# Patient Record
Sex: Female | Born: 1935
Health system: Southern US, Community
[De-identification: ages and names within clinical notes are randomized; demographics above are authoritative.]

## PROBLEM LIST (undated history)

## (undated) DIAGNOSIS — E782 Mixed hyperlipidemia: Secondary | ICD-10-CM

## (undated) DIAGNOSIS — C519 Malignant neoplasm of vulva, unspecified: Secondary | ICD-10-CM

## (undated) DIAGNOSIS — R112 Nausea with vomiting, unspecified: Secondary | ICD-10-CM

## (undated) DIAGNOSIS — M1990 Unspecified osteoarthritis, unspecified site: Secondary | ICD-10-CM

## (undated) DIAGNOSIS — C4499 Other specified malignant neoplasm of skin, unspecified: Secondary | ICD-10-CM

## (undated) DIAGNOSIS — N368 Other specified disorders of urethra: Secondary | ICD-10-CM

## (undated) DIAGNOSIS — I10 Essential (primary) hypertension: Secondary | ICD-10-CM

## (undated) DIAGNOSIS — Z9889 Other specified postprocedural states: Secondary | ICD-10-CM

## (undated) DIAGNOSIS — N369 Urethral disorder, unspecified: Secondary | ICD-10-CM

## (undated) DIAGNOSIS — G4700 Insomnia, unspecified: Secondary | ICD-10-CM

## (undated) HISTORY — DX: Unspecified osteoarthritis, unspecified site: M19.90

## (undated) HISTORY — DX: Mixed hyperlipidemia: E78.2

## (undated) HISTORY — DX: Other specified malignant neoplasm of skin, unspecified: C44.99

## (undated) HISTORY — PX: OTHER SURGICAL HISTORY: SHX169

## (undated) HISTORY — PX: VULVECTOMY: SHX1086

## (undated) HISTORY — DX: Other specified disorders of urethra: N36.8

## (undated) HISTORY — PX: TOTAL ABDOMINAL HYSTERECTOMY W/ BILATERAL SALPINGOOPHORECTOMY: SHX83

## (undated) HISTORY — DX: Malignant neoplasm of vulva, unspecified: C51.9

## (undated) HISTORY — DX: Essential (primary) hypertension: I10

## (undated) HISTORY — PX: ABDOMINAL HYSTERECTOMY: SHX81

## (undated) HISTORY — DX: Insomnia, unspecified: G47.00

## (undated) HISTORY — PX: CHOLECYSTECTOMY: SHX55

## (undated) HISTORY — PX: GALLBLADDER SURGERY: SHX652

## (undated) HISTORY — PX: TONSILLECTOMY: SUR1361

## (undated) HISTORY — DX: Urethral disorder, unspecified: N36.9

---

## 2012-12-05 ENCOUNTER — Ambulatory Visit: Payer: Self-pay | Admitting: Gynecologic Oncology

## 2012-12-11 LAB — PATHOLOGY REPORT

## 2012-12-24 ENCOUNTER — Ambulatory Visit: Payer: Self-pay | Admitting: Gynecologic Oncology

## 2013-01-29 ENCOUNTER — Ambulatory Visit: Payer: Self-pay | Admitting: Gynecologic Oncology

## 2013-01-30 ENCOUNTER — Ambulatory Visit: Payer: Self-pay | Admitting: Gynecologic Oncology

## 2013-01-30 LAB — CBC
HCT: 40.6 % (ref 35.0–47.0)
MCH: 31.8 pg (ref 26.0–34.0)
Platelet: 207 10*3/uL (ref 150–440)
RDW: 13.3 % (ref 11.5–14.5)

## 2013-01-30 LAB — BASIC METABOLIC PANEL
Calcium, Total: 9.6 mg/dL (ref 8.5–10.1)
Chloride: 105 mmol/L (ref 98–107)
Co2: 33 mmol/L — ABNORMAL HIGH (ref 21–32)
Creatinine: 0.88 mg/dL (ref 0.60–1.30)
EGFR (African American): >60
EGFR (Non-African Amer.): >60
Osmolality: 283 (ref 275–301)

## 2013-02-06 ENCOUNTER — Ambulatory Visit: Payer: Self-pay | Admitting: Gynecologic Oncology

## 2013-02-08 LAB — PATHOLOGY REPORT

## 2013-02-23 ENCOUNTER — Ambulatory Visit: Payer: Self-pay | Admitting: Gynecologic Oncology

## 2013-03-26 ENCOUNTER — Ambulatory Visit: Payer: Self-pay | Admitting: Gynecologic Oncology

## 2013-05-11 ENCOUNTER — Ambulatory Visit: Payer: Self-pay | Admitting: Family Medicine

## 2013-06-26 ENCOUNTER — Ambulatory Visit: Payer: Self-pay | Admitting: Gynecologic Oncology

## 2013-07-26 ENCOUNTER — Ambulatory Visit: Payer: Self-pay | Admitting: Gynecologic Oncology

## 2013-12-24 ENCOUNTER — Ambulatory Visit: Payer: Self-pay | Admitting: Gynecologic Oncology

## 2014-01-23 ENCOUNTER — Ambulatory Visit: Payer: Self-pay | Admitting: Gynecologic Oncology

## 2014-03-19 ENCOUNTER — Ambulatory Visit: Payer: Self-pay | Admitting: Urology

## 2014-03-19 LAB — BASIC METABOLIC PANEL
Anion Gap: 8 (ref 7–16)
BUN: 17 mg/dL (ref 7–18)
CALCIUM: 9.3 mg/dL (ref 8.5–10.1)
CREATININE: 0.85 mg/dL (ref 0.60–1.30)
Chloride: 102 mmol/L (ref 98–107)
Co2: 30 mmol/L (ref 21–32)
EGFR (African American): >60
Glucose: 104 mg/dL — ABNORMAL HIGH (ref 65–99)
Osmolality: 281 (ref 275–301)
Potassium: 3.8 mmol/L (ref 3.5–5.1)
Sodium: 140 mmol/L (ref 136–145)

## 2014-03-19 LAB — CBC
HCT: 41.3 % (ref 35.0–47.0)
HGB: 14.1 g/dL (ref 12.0–16.0)
MCH: 31.4 pg (ref 26.0–34.0)
MCHC: 34.1 g/dL (ref 32.0–36.0)
MCV: 92 fL (ref 80–100)
PLATELETS: 228 10*3/uL (ref 150–440)
RBC: 4.49 10*6/uL (ref 3.80–5.20)
RDW: 13.7 % (ref 11.5–14.5)
WBC: 9.5 10*3/uL (ref 3.6–11.0)

## 2014-03-19 LAB — PROTIME-INR
INR: 1
PROTHROMBIN TIME: 13.4 s (ref 11.5–14.7)

## 2014-03-19 LAB — APTT: Activated PTT: 35.7 secs (ref 23.6–35.9)

## 2014-03-20 LAB — URINALYSIS, COMPLETE
BACTERIA: NONE SEEN
Bilirubin,UR: NEGATIVE
GLUCOSE, UR: NEGATIVE mg/dL (ref 0–75)
Ketone: NEGATIVE
LEUKOCYTE ESTERASE: NEGATIVE
NITRITE: NEGATIVE
PH: 7 (ref 4.5–8.0)
Protein: 100
SPECIFIC GRAVITY: 1.013 (ref 1.003–1.030)
Squamous Epithelial: NONE SEEN
WBC UR: 6 /HPF (ref 0–5)

## 2014-03-21 ENCOUNTER — Encounter: Payer: Self-pay | Admitting: Cardiology

## 2014-03-21 ENCOUNTER — Ambulatory Visit (INDEPENDENT_AMBULATORY_CARE_PROVIDER_SITE_OTHER): Payer: Medicare PPO | Admitting: Cardiology

## 2014-03-21 VITALS — BP 151/68 | HR 60 | Ht 65.0 in | Wt 171.0 lb

## 2014-03-21 DIAGNOSIS — I10 Essential (primary) hypertension: Secondary | ICD-10-CM | POA: Insufficient documentation

## 2014-03-21 DIAGNOSIS — E782 Mixed hyperlipidemia: Secondary | ICD-10-CM | POA: Insufficient documentation

## 2014-03-21 DIAGNOSIS — Z01810 Encounter for preprocedural cardiovascular examination: Secondary | ICD-10-CM

## 2014-03-21 NOTE — Progress Notes (Signed)
Clinical Summary Ms. Matos is a 80 y.o.female referred by Dr. Larene Beach of [facility] for cardiology consultation. She reports recent hematuria and has had urological evaluation with Dr. Hollice Espy. Plan is for her to undergo an elective urethral biopsy/bladder biopsy. She had a recent pre-anesthesia evaluation at [facility] and was told that she had an abnormal ECG prompting this referral.  Symptomatically speaking, she denies any exertional chest pain, reports NYHA class I dyspnea on exertion, no palpitations or syncope. She reports no personal history of CAD, myocardial infarction, CHF, or arrhythmia. She states that she is active in her ADLs, describes activities exceeding 4 METs.  ECG done in the office today shows sinus bradycardia. I reviewed the recent and year old tracing done at [facility]. Both show sinus rhythm with nonspecific T-wave changes. PR interval with mildly prolonged on the tracing from July 2014, less notable no recent tracing. It looks like there were was different lead position and most recent tracing with a more leftward axis, not apparent on today's tracing. The long and short of it is that there is nothing that looks overly concerning that would prevent her from proceeding with planned urologic procedure, particularly in light of her clinical stability.  Allergies  Allergen Reactions  . Percocet [Oxycodone-Acetaminophen] Rash    Current Outpatient Prescriptions  Medication Sig Dispense Refill  . aspirin 81 MG tablet Take 81 mg by mouth daily. Holding due to surgery.      Marland Kitchen atenolol (TENORMIN) 100 MG tablet Take 100 mg by mouth daily.      . hydrochlorothiazide (HYDRODIURIL) 25 MG tablet Take 25 mg by mouth daily.      Marland Kitchen lisinopril (PRINIVIL,ZESTRIL) 40 MG tablet Take 40 mg by mouth daily.      . simvastatin (ZOCOR) 40 MG tablet Take 40 mg by mouth daily.       No current facility-administered medications for this visit.    Past  Medical History  Diagnosis Date  . Essential hypertension, benign   . Mixed hyperlipidemia   . Insomnia   . Osteoarthritis   . Paget's disease of vulva     Past Surgical History  Procedure Laterality Date  . Tonsillectomy    . Vulvectomy    . Abdominal hysterectomy    . External hemorrhoid surgery    . Gallbladder surgery      Family History  Problem Relation Age of Onset  . Heart disease Father     Diagnosed age 6  . Heart failure Mother   . Cancer Brother     Social History Ms. Taney reports that she has never smoked. She does not have any smokeless tobacco history on file. Ms. Schum reports that she does not drink alcohol.  Review of Systems Other systems reviewed and negative.  Physical Examination Filed Vitals:   03/21/14 0825  BP: 151/68  Pulse: 60   Filed Weights   03/21/14 0825  Weight: 171 lb (77.565 kg)   Patient appears comfortable at rest, normally nourished. HEENT: Conjunctiva and lids normal, oropharynx clear. Neck: Supple, no elevated JVP or carotid bruits, no thyromegaly. Lungs: Clear to auscultation, nonlabored breathing at rest. Cardiac: Regular rate and rhythm, no S3 or significant systolic murmur, no pericardial rub. Abdomen: Soft, nontender, bowel sounds present, no guarding or rebound. Extremities: No pitting edema, distal pulses 2+. Skin: Warm and dry. Musculoskeletal: No kyphosis. Neuropsychiatric: Alert and oriented x3, affect grossly appropriate.   Problem List and Plan   Preoperative  cardiovascular examination Patient being considered for elective urethral biopsy/bladder biopsy secondary to recent onset hematuria. She has a history of Paget's disease of the vulva, has had several surgeries in the past, and reports that this has otherwise been stable. I reviewed the recent ECGs, including repeat tracing done today. As noted above, there is no major abnormality that would require further investigation now, particularly in light  of clinical stability with good functional capacity at baseline, and no personal history of CAD or arrhythmia. She is cleared to proceed with planned procedure at an acceptable perioperative cardiac risk, overall low.  Essential hypertension, benign Blood pressure elevated today. She states she has a follow with Dr. Luan Pulling this week, otherwise compliant with medical therapy.  Mixed hyperlipidemia On Zocor.    Satira Sark, M.D., F.A.C.C.

## 2014-03-21 NOTE — Assessment & Plan Note (Signed)
On Zocor 

## 2014-03-21 NOTE — Assessment & Plan Note (Signed)
Blood pressure elevated today. She states she has a follow with Dr. Luan Pulling this week, otherwise compliant with medical therapy.

## 2014-03-21 NOTE — Patient Instructions (Signed)
Your physician recommends that you schedule a follow-up appointment in: as needed Your physician recommends that you continue on your current medications as directed. Please refer to the Current Medication list given to you today.   

## 2014-03-21 NOTE — Assessment & Plan Note (Signed)
Patient being considered for elective urethral biopsy/bladder biopsy secondary to recent onset hematuria. She has a history of Paget's disease of the vulva, has had several surgeries in the past, and reports that this has otherwise been stable. I reviewed the recent ECGs, including repeat tracing done today. As noted above, there is no major abnormality that would require further investigation now, particularly in light of clinical stability with good functional capacity at baseline, and no personal history of CAD or arrhythmia. She is cleared to proceed with planned procedure at an acceptable perioperative cardiac risk, overall low.

## 2014-03-22 LAB — URINE CULTURE

## 2014-03-27 ENCOUNTER — Ambulatory Visit: Payer: Self-pay | Admitting: Urology

## 2014-04-16 LAB — PATHOLOGY REPORT

## 2014-06-26 ENCOUNTER — Ambulatory Visit: Payer: Self-pay | Admitting: Obstetrics and Gynecology

## 2014-07-26 ENCOUNTER — Ambulatory Visit: Payer: Self-pay | Admitting: Obstetrics and Gynecology

## 2014-11-04 DIAGNOSIS — M170 Bilateral primary osteoarthritis of knee: Secondary | ICD-10-CM | POA: Insufficient documentation

## 2014-11-15 NOTE — Op Note (Signed)
PATIENT NAME:  Carla Sparks, Carla Sparks MR#:  [id number] DATE OF BIRTH:  [birth year]  DATE OF PROCEDURE:  02/06/2013  PREOPERATIVE DIAGNOSIS: Recurrent Paget's disease of the vulva.   POSTOPERATIVE DIAGNOSIS: Recurrent Paget's disease of the vulva.   PROCEDURE PERFORMED: Wide local excision of 2 lesions.   SURGEON: Weber Cooks, MD  ANESTHESIA: General.   ESTIMATED BLOOD LOSS: 50 mL.   COMPLICATIONS: None.   INDICATION FOR SURGERY: [name] is a 81 year old patient with a long history of recurrent Paget's disease, which has been treated with multiple resections. When seen in the clinic, a suspicious area was seen on the anterior mons. Biopsy confirmed the presence of Paget's disease. Another suspicious lesion was seen on the right labia minora. Therefore, the decision was made to proceed with resection.   OPERATIVE REPORT: After adequate general anesthesia had been obtained, the patient was prepped and draped in high lithotomy position. The suspicious area on the right labia minora was biopsied to confirm the presence of Paget's disease. Then, first, the anterior lesion was circumferentially incised. The incision was carried down to the subcutaneous tissue, and the specimen was then removed using cautery. Hemostasis was adequate. Tissue flaps were raised on both sides. The skin was then reapproximated with interrupted 2-0 Vicryl stitches and closed with mattress sutures again using 2-0 Vicryl.   After frozen section confirmed the presence of Paget's disease, the lesion on the right labia minora was again circumferentially incised and removed using cautery, including some of the subcutaneous tissue. Hemostasis was achieved with cautery. Tissue flaps were then raised from the vagina and laterally. The subcutaneous tissue was reapproximated using 2-0 Vicryl interrupted sutures, and the skin was closed again using 2-0 Vicryl in a mattress suture.   Hemostasis was adequate at the end of the  procedure. The bladder was drained. Silvadene cream was applied.   Pad, sponge, needle and instrument counts were correct x2.   ____________________________ Weber Cooks, MD bem:OSi D: 02/07/2013 09:04:47 ET T: 02/07/2013 09:29:52 ET JOB#: [id number]  cc: Weber Cooks, MD, <Dictator> Weber Cooks MD ELECTRONICALLY SIGNED 02/14/2013 15:19

## 2014-11-16 NOTE — Op Note (Signed)
PATIENT NAME:  Carla Sparks, Carla Sparks MR#:  [id number] DATE OF BIRTH:  Aug 24, 1933  DATE OF PROCEDURE:  03/27/2014  PREOPERATIVE DIAGNOSES: Hematuria, urethral lesion.  POSTOPERATIVE DIAGNOSES: Hematuria, urethral lesion.  PROCEDURE PERFORMED: Cystoscopy, bilateral retrograde pyelogram, bladder biopsy, urethral biopsy, interpretation of fluoroscopy less than 30 minutes.  ATTENDING SURGEON: Sherlynn Stalls, MD  ANESTHESIA: General anesthesia.  ESTIMATED BLOOD LOSS: 25 mL.  DRAINS: None.  COMPLICATIONS: None.  SPECIMENS: Random bladder biopsies, urethral biopsy.  INDICATIONS: This is a 81 year old woman with a history of Paget disease and multiple labial reconstructive surgeries. She presented to the urology clinic with new-onset gross hematuria, bleeding from a periurethral lesion. She was counseled to undergo biopsy of this mass in the operating room along with cystoscopy and bilateral retrograde to complete her hematuria workup. Risks and benefits of the procedure were explained in detail. The patient agreed to proceed as planned.  PROCEDURE: The patient was correctly identified in the preoperative holding area and informed consent was confirmed. She was brought to the operating room, a universal timeout protocol was performed. All team members were identified and Venodyne boots were placed and she was administered IV Ancef in the perioperative period. Of note, she did have a positive urine culture preoperatively and was treated appropriately with ciprofloxacin, which she has been taking as instructed. She was then placed under general anesthesia, repositioned in the dorsal lithotomy position, and prepped and draped in the standard surgical fashion. At this point in time, the perineum was inspected and no other obvious tumors or lesions were identified at this time. Inspection of her urethra did reveal a fungating, beefy red, tongue-like lesion at the 6 o'clock position of the urethra spanning  from approximately 5 o'clock to 7 o'clock, approximately 2 cm in length, somewhat heaped up. There was some slight oozing noted from this lesion. A rigid cystoscope using a 22-French access sheath was then advanced into the bladder and the bladder was surveilled. This revealed bullous edema throughout the bladder involving the bladder globally without any areas of sparing. 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 Given her somewhat complex history, the decision was made to go ahead and biopsy these areas, at which time 4 random bladder biopsies were performed: Two in the posterior walls and two on each lateral wall of the bladder. These were sent off for pathologic analysis. The bleeding areas were fulgurated until hemostasis was noted. At this point, each UO was cannulated using a 5-French open-ended ureteral catheter just within the UO and then bilateral retrograde pyelograms were performed. These were normal, without any evidence of hydronephrosis or filling defects throughout the entirety of the ureter or within the renal pelvises. At this point in time, the scope was removed and careful attention was paid to the urethra. There was no obvious involvement of the mass within the proximal and mid urethra. It appeared to involve the inferior portion of the urethra very distally and externally. At this point in time, a 15 blade was used to excise a large portion of the urethral lesion, which was sent off as a biopsy. At this point there was a good amount of bleeding from the lesion and hemostasis was controlled with a series of figure-of-eight chromic sutures within the lesion as well as fulguration with a pinpoint Bovie electrocautery. Ultimately, adequate hemostasis was obtained. The lesion was instilled with 10 mL of 0.5% lidocaine for local anesthetic. The sheath with obturator was then passed easily into the bladder to confirm urethral patency after  the sutures had been thrown without difficulty. The  operation was then deemed complete. She was returned to the supine position, reversed from anesthesia, and taken to the PACU in stable condition. There were no complications in this case.    ____________________________ Sherlynn Stalls, MD ajb:ST D: 03/27/2014 19:02:35 ET T: 03/28/2014 00:13:00 ET JOB#: [id number]  cc: Sherlynn Stalls, MD, <Dictator> Sherlynn Stalls MD ELECTRONICALLY SIGNED 05/05/2014 8:16

## 2014-11-27 ENCOUNTER — Ambulatory Visit: Payer: Medicare PPO

## 2014-12-16 ENCOUNTER — Other Ambulatory Visit: Payer: Self-pay | Admitting: Family Medicine

## 2014-12-16 DIAGNOSIS — Z13820 Encounter for screening for osteoporosis: Secondary | ICD-10-CM

## 2014-12-18 ENCOUNTER — Inpatient Hospital Stay: Payer: Medicare PPO | Attending: Obstetrics and Gynecology | Admitting: Obstetrics and Gynecology

## 2014-12-18 ENCOUNTER — Encounter (INDEPENDENT_AMBULATORY_CARE_PROVIDER_SITE_OTHER): Payer: Self-pay

## 2014-12-18 ENCOUNTER — Ambulatory Visit: Payer: Medicare PPO

## 2014-12-18 ENCOUNTER — Encounter: Payer: Self-pay | Admitting: *Deleted

## 2014-12-18 VITALS — BP 146/71 | HR 65 | Temp 99.0°F | Resp 18 | Ht 65.0 in | Wt 166.9 lb

## 2014-12-18 DIAGNOSIS — M1990 Unspecified osteoarthritis, unspecified site: Secondary | ICD-10-CM | POA: Insufficient documentation

## 2014-12-18 DIAGNOSIS — C4499 Other specified malignant neoplasm of skin, unspecified: Secondary | ICD-10-CM | POA: Insufficient documentation

## 2014-12-18 DIAGNOSIS — C519 Malignant neoplasm of vulva, unspecified: Secondary | ICD-10-CM | POA: Insufficient documentation

## 2014-12-18 DIAGNOSIS — Z7982 Long term (current) use of aspirin: Secondary | ICD-10-CM | POA: Insufficient documentation

## 2014-12-18 DIAGNOSIS — E785 Hyperlipidemia, unspecified: Secondary | ICD-10-CM

## 2014-12-18 DIAGNOSIS — Z79899 Other long term (current) drug therapy: Secondary | ICD-10-CM | POA: Insufficient documentation

## 2014-12-18 DIAGNOSIS — I10 Essential (primary) hypertension: Secondary | ICD-10-CM | POA: Diagnosis not present

## 2014-12-18 DIAGNOSIS — Z809 Family history of malignant neoplasm, unspecified: Secondary | ICD-10-CM | POA: Diagnosis not present

## 2014-12-18 DIAGNOSIS — M129 Arthropathy, unspecified: Secondary | ICD-10-CM | POA: Diagnosis not present

## 2014-12-18 DIAGNOSIS — G4700 Insomnia, unspecified: Secondary | ICD-10-CM | POA: Diagnosis not present

## 2014-12-18 NOTE — Progress Notes (Signed)
Gynecologic Oncology Interval Note  Referring physician: Dr. Ellene Sparks  Chief Complaint: Paget's disease of the vulva and urethra  Subjective:  Carla Sparks is a 81 y.o. woman who presents today for continued surveillance for history of Paget's disease of the vulva and urethra.   At her last visit she opted to try a trial of topical Efudex therapy. She was able to use this treatment for 5 weeks but stated that her skin became very erythematous. She stop using the medication at that time and it took about 2 weeks for the skin to recover. She is scheduled to see Dr. Erlene Sparks next week for evaluation of the urethral lesion. She is otherwise doing well and has no complaints. She continues to apply Aquaphor to the urethral area for comfort.   Oncology Treatment History:  The patient is a pleasant patient with a long history of Paget's disease of the vulva. Her history dates back to the 1980s when she recurrent episodes of Paget's disease of the vulva with multiple resections, and in 2009 a vulvectomy with Dr. Clarene Sparks.  On 12/05/2012 she underwent vulvar biopsy that demonstrated recurrent Paget's. Pap was negative. On 02/07/2013 she underwent partial vulvectomy of 2 lesions by Dr. Sabra Sparks at [facility] with pathology demonstrating Paget's disease.  At a surveillance visit in 2015 she was noted to have a abnormal appearing urethral lesion and she was referred to Dr. Erlene Sparks. She underwent cystoscopy, bilateral retrograde pyelograms, bladder biopsy, urethral biopsy, and interpretation of fluoroscopy less than 30 minutes. The urethral biopsy was positive for Paget's disease. Cystoscopy and bladder biopsies were consistent with cystitis. She was referred to Dr. Bjorn Sparks for evaluation. He specializes in reconstructive incontinence procedures. He recommended a second opinion with Dr. Tresa Sparks which she declined. She opted not to proceed with surgical intervention.  She applies Aquaphor to the urethral  area for comfort.   Problem List: Patient Active Problem List   Diagnosis Date Noted  . Paget disease, extra mammary 12/18/2014  . Preoperative cardiovascular examination 03/21/2014  . Essential hypertension, benign 03/21/2014  . Mixed hyperlipidemia 03/21/2014    Past Medical History: Past Medical History  Diagnosis Date  . Essential hypertension, benign   . Mixed hyperlipidemia   . Insomnia   . Osteoarthritis   . Paget's disease of vulva   . HTN (hypertension)   . Arthritis     Past Surgical History: Past Surgical History  Procedure Laterality Date  . Tonsillectomy    . Vulvectomy    . Total abdominal hysterectomy w/ bilateral salpingoophorectomy    . External hemorrhoid surgery    . Gallbladder surgery      Family History: Family History  Problem Relation Age of Onset  . Heart disease Father     Diagnosed age 6  . Heart failure Mother   . Cancer Brother   . Kidney cancer Father     Social History: History   Social History  . Marital Status: Married    Spouse Name: N/A  . Number of Children: N/A  . Years of Education: N/A   Occupational History  . Not on file.   Social History Main Topics  . Smoking status: Never Smoker   . Smokeless tobacco: Never Used  . Alcohol Use: No  . Drug Use: No  . Sexual Activity: Not on file   Other Topics Concern  . Not on file   Social History Narrative    Allergies: Allergies  Allergen Reactions  . Percocet [Oxycodone-Acetaminophen] Rash  Current Medications: Current Outpatient Prescriptions  Medication Sig Dispense Refill  . amLODipine (NORVASC) 5 MG tablet     . aspirin 81 MG tablet Take 81 mg by mouth daily. Holding due to surgery.    Marland Kitchen atenolol (TENORMIN) 100 MG tablet Take 100 mg by mouth daily.    . hydrochlorothiazide (HYDRODIURIL) 25 MG tablet Take 25 mg by mouth daily.    Marland Kitchen lisinopril (PRINIVIL,ZESTRIL) 40 MG tablet Take 40 mg by mouth daily.    . Melatonin 5 MG TABS Take 5 mg by mouth.     . simvastatin (ZOCOR) 40 MG tablet Take 40 mg by mouth daily.     No current facility-administered medications for this visit.      Review of Systems A comprehensive review of systems was negative.  Objective:  Physical Examination:  BP 146/71 mmHg  Pulse 65  Temp(Src) 99 F (37.2 C) (Tympanic)  Resp 18  Ht 5\' 5"  (1.651 m)  Wt 166 lb 14.2 oz (75.7 kg)  BMI 27.77 kg/m2  ECOG Performance Status: 0 - Asymptomatic  General appearance: alert, cooperative and appears stated age HEENT: ATNC Lymph node survey: negative inguinal nodes Abdomen: lower abdomen soft, nontender, nondistended without masses or ascites Neurological exam reveals alert, oriented, normal speech, no focal findings or movement disorder noted  Pelvic: Vulva: Distorted anatomy was extensive scarring of the external genitalia after multiple resections. The areas noted last time of erythema of the anterior vulva surrounding the urethral meatus and clitoris appear improved compared to her last visit. There may be 2 sites that could represent Paget's and that are bilaterally surrounding the urethral meatus about 1 cm lateral to the urethral meatus. The urethral meatus still appears to be very abnormal with increased erythema. Vagina: normal vagina; Adnexa: no masses; Uterus: surgically absent, vaginal cuff well healed; Cervix: absent; Rectal: not performed   Lab Review Labs on site today: Pap smear pending.   Assessment:   Carla Sparks is a 81 y.o. female with Paget's disease of the vulva and urethra. Urethral carunkle.   Plan:   Problem List Items Addressed This Visit      Musculoskeletal and Integument   Paget disease, extra mammary - Primary     She'll be seeing Dr. Erlene Sparks next week. I asked her to contact me with Dr. Cherrie Gauze recommendations. I'm very curious to hear what her opinion is of the urethral lesion.  Suggested return to clinic in  6 months.    Gillis Ends, MD  CC:   Carla Porta., MD [address] [place], [place] [address] [phone]

## 2015-01-01 ENCOUNTER — Ambulatory Visit
Admission: RE | Admit: 2015-01-01 | Discharge: 2015-01-01 | Disposition: A | Discharge status: Home / Self Care | Payer: Medicare PPO | Source: Ambulatory Visit | Attending: Family Medicine | Admitting: Family Medicine

## 2015-01-01 DIAGNOSIS — M8580 Other specified disorders of bone density and structure, unspecified site: Secondary | ICD-10-CM | POA: Diagnosis not present

## 2015-01-01 DIAGNOSIS — Z13820 Encounter for screening for osteoporosis: Secondary | ICD-10-CM

## 2015-01-01 DIAGNOSIS — M8588 Other specified disorders of bone density and structure, other site: Secondary | ICD-10-CM | POA: Diagnosis not present

## 2015-01-02 ENCOUNTER — Telehealth: Payer: Self-pay

## 2015-01-02 NOTE — Telephone Encounter (Signed)
Advised Osteopenic and we will watch but not change anything at this time. Patient understood.

## 2015-04-29 ENCOUNTER — Ambulatory Visit (INDEPENDENT_AMBULATORY_CARE_PROVIDER_SITE_OTHER): Payer: Medicare PPO | Admitting: Family Medicine

## 2015-04-29 ENCOUNTER — Encounter: Payer: Self-pay | Admitting: Family Medicine

## 2015-04-29 VITALS — BP 138/75 | HR 61 | Temp 98.5°F | Resp 16 | Ht 65.0 in | Wt 175.4 lb

## 2015-04-29 DIAGNOSIS — E782 Mixed hyperlipidemia: Secondary | ICD-10-CM

## 2015-04-29 DIAGNOSIS — I10 Essential (primary) hypertension: Secondary | ICD-10-CM

## 2015-04-29 DIAGNOSIS — R7309 Other abnormal glucose: Secondary | ICD-10-CM

## 2015-04-29 DIAGNOSIS — R739 Hyperglycemia, unspecified: Secondary | ICD-10-CM

## 2015-04-29 DIAGNOSIS — G4700 Insomnia, unspecified: Secondary | ICD-10-CM

## 2015-04-29 MED ORDER — AMLODIPINE BESYLATE 5 MG PO TABS: 5.0000 mg | ORAL_TABLET | Freq: Every day | ORAL | Status: DC

## 2015-04-29 NOTE — Progress Notes (Signed)
Name: Carla Sparks   MRN: [id number]    DOB: [birth year]   Date:04/29/2015       Progress Note  Subjective  Chief Complaint  Chief Complaint  Patient presents with  . Hypertension    follow up    HPI  Here for f/u of HBP.  Getting BPs of 110-120 sys. At home.  Feeling well ov erall.  Sleeping difficulty has improved on Zoloft. No problem-specific assessment & plan notes found for this encounter.   Past Medical History  Diagnosis Date  . Essential hypertension, benign   . Mixed hyperlipidemia   . Insomnia   . Osteoarthritis   . Paget's disease of vulva   . HTN (hypertension)   . Arthritis     Social History  Substance Use Topics  . Smoking status: Never Smoker   . Smokeless tobacco: Never Used  . Alcohol Use: No     Current outpatient prescriptions:  .  amLODipine (NORVASC) 5 MG tablet, , Disp: , Rfl:  .  aspirin 81 MG tablet, Take 81 mg by mouth daily. Holding due to surgery., Disp: , Rfl:  .  atenolol (TENORMIN) 100 MG tablet, Take 100 mg by mouth daily., Disp: , Rfl:  .  hydrochlorothiazide (HYDRODIURIL) 25 MG tablet, Take 25 mg by mouth daily., Disp: , Rfl:  .  lisinopril (PRINIVIL,ZESTRIL) 40 MG tablet, Take 40 mg by mouth daily., Disp: , Rfl:  .  Melatonin 5 MG TABS, Take 5 mg by mouth., Disp: , Rfl:  .  sertraline (ZOLOFT) 50 MG tablet, , Disp: , Rfl:  .  simvastatin (ZOCOR) 40 MG tablet, Take 40 mg by mouth daily., Disp: , Rfl:   Allergies  Allergen Reactions  . Percocet [Oxycodone-Acetaminophen] Rash    Review of Systems  Constitutional: Negative for fever, chills, weight loss and malaise/fatigue.  HENT: Negative for hearing loss.   Eyes: Negative for blurred vision and double vision.  Respiratory: Negative for cough, shortness of breath and wheezing.   Cardiovascular: Negative for chest pain, palpitations, orthopnea and leg swelling.  Gastrointestinal: Negative for heartburn, abdominal pain and blood in stool.  Genitourinary: Negative for  dysuria, urgency and frequency.  Musculoskeletal: Negative for myalgias and joint pain.  Neurological: Negative for dizziness, sensory change, focal weakness, weakness and headaches.  Psychiatric/Behavioral: Negative for depression. The patient has insomnia (improved). The patient is not nervous/anxious.       Objective  Filed Vitals:   04/29/15 0934  BP: 138/75  Pulse: 61  Temp: 98.5 F (36.9 C)  TempSrc: Oral  Resp: 16  Height: 5\' 5"  (1.651 m)  Weight: 175 lb 6.4 oz (79.561 kg)     Physical Exam  Constitutional: She is oriented to person, place, and time and well-developed, well-nourished, and in no distress. No distress.  HENT:  Head: Normocephalic and atraumatic.  Eyes: Conjunctivae and EOM are normal. Pupils are equal, round, and reactive to light. No scleral icterus.  Neck: Normal range of motion. Neck supple. Carotid bruit is not present. No thyromegaly present.  Cardiovascular: Normal rate, regular rhythm, normal heart sounds and intact distal pulses.  Exam reveals no gallop and no friction rub.   No murmur heard. Pulmonary/Chest: Effort normal and breath sounds normal. No respiratory distress. She has no wheezes. She has no rales.  Abdominal: Soft. Bowel sounds are normal. She exhibits no distension. There is no tenderness. There is no rebound.  Musculoskeletal: She exhibits no edema.  Lymphadenopathy:    She has no cervical adenopathy.  Neurological: She is alert and oriented to person, place, and time.  Vitals reviewed.     No results found for this or any previous visit (from the past 2160 hour(s)).   Assessment & Plan  1. Essential hypertension, benign  - amLODipine (NORVASC) 5 MG tablet; Take 1 tablet (5 mg total) by mouth daily.  Dispense: 90 tablet; Refill: 3 - Comprehensive Metabolic Panel (CMET) - CBC with Differential  2. Insomnia   3. Mixed hyperlipidemia  - Lipid Profile  4. Elevated blood sugar  - HgB A1c

## 2015-04-29 NOTE — Patient Instructions (Addendum)
Patient declines flu shot.  Reports egg allergy.  Cont. Current doses of current meds.  Cont. To check BP at home.

## 2015-04-30 DIAGNOSIS — E782 Mixed hyperlipidemia: Secondary | ICD-10-CM | POA: Diagnosis not present

## 2015-04-30 DIAGNOSIS — R7309 Other abnormal glucose: Secondary | ICD-10-CM | POA: Diagnosis not present

## 2015-04-30 DIAGNOSIS — I10 Essential (primary) hypertension: Secondary | ICD-10-CM | POA: Diagnosis not present

## 2015-05-01 LAB — HEMOGLOBIN A1C
Est. average glucose Bld gHb Est-mCnc: 123 mg/dL
HEMOGLOBIN A1C: 5.9 % — AB (ref 4.8–5.6)

## 2015-05-01 LAB — COMPREHENSIVE METABOLIC PANEL
A/G RATIO: 1.8 (ref 1.1–2.5)
ALT: 19 IU/L (ref 0–32)
AST: 18 IU/L (ref 0–40)
Albumin: 4.6 g/dL (ref 3.5–4.8)
Alkaline Phosphatase: 56 IU/L (ref 39–117)
BUN/Creatinine Ratio: 20 (ref 11–26)
BUN: 18 mg/dL (ref 8–27)
Bilirubin Total: 0.8 mg/dL (ref 0.0–1.2)
CALCIUM: 10 mg/dL (ref 8.7–10.3)
CO2: 27 mmol/L (ref 18–29)
Chloride: 99 mmol/L (ref 97–108)
Creatinine, Ser: 0.91 mg/dL (ref 0.57–1.00)
GFR, EST AFRICAN AMERICAN: 69 mL/min/{1.73_m2} (ref >59)
GFR, EST NON AFRICAN AMERICAN: 60 mL/min/{1.73_m2} (ref >59)
Globulin, Total: 2.6 g/dL (ref 1.5–4.5)
Glucose: 112 mg/dL — ABNORMAL HIGH (ref 65–99)
Potassium: 4.6 mmol/L (ref 3.5–5.2)
Sodium: 141 mmol/L (ref 134–144)
Total Protein: 7.2 g/dL (ref 6.0–8.5)

## 2015-05-01 LAB — CBC WITH DIFFERENTIAL/PLATELET
BASOS: 1 %
Basophils Absolute: 0.1 10*3/uL (ref 0.0–0.2)
EOS (ABSOLUTE): 0.2 10*3/uL (ref 0.0–0.4)
EOS: 3 %
HEMOGLOBIN: 14.3 g/dL (ref 11.1–15.9)
Hematocrit: 42.6 % (ref 34.0–46.6)
IMMATURE GRANS (ABS): 0 10*3/uL (ref 0.0–0.1)
Immature Granulocytes: 0 %
LYMPHS: 28 %
Lymphocytes Absolute: 1.8 10*3/uL (ref 0.7–3.1)
MCH: 31 pg (ref 26.6–33.0)
MCHC: 33.6 g/dL (ref 31.5–35.7)
MCV: 92 fL (ref 79–97)
MONOCYTES: 8 %
Monocytes Absolute: 0.5 10*3/uL (ref 0.1–0.9)
NEUTROS ABS: 3.9 10*3/uL (ref 1.4–7.0)
Neutrophils: 60 %
Platelets: 237 10*3/uL (ref 150–379)
RBC: 4.61 x10E6/uL (ref 3.77–5.28)
RDW: 13.5 % (ref 12.3–15.4)
WBC: 6.5 10*3/uL (ref 3.4–10.8)

## 2015-05-01 LAB — LIPID PANEL
Chol/HDL Ratio: 3.9 ratio units (ref 0.0–4.4)
Cholesterol, Total: 181 mg/dL (ref 100–199)
HDL: 47 mg/dL (ref >39)
LDL CALC: 99 mg/dL (ref 0–99)
Triglycerides: 175 mg/dL — ABNORMAL HIGH (ref 0–149)
VLDL CHOLESTEROL CAL: 35 mg/dL (ref 5–40)

## 2015-05-02 ENCOUNTER — Telehealth: Payer: Self-pay | Admitting: Family Medicine

## 2015-05-02 ENCOUNTER — Other Ambulatory Visit: Payer: Self-pay | Admitting: Family Medicine

## 2015-05-02 DIAGNOSIS — Z0000 Encounter for general adult medical examination without abnormal findings: Secondary | ICD-10-CM

## 2015-05-02 NOTE — Telephone Encounter (Signed)
The order send pt aware and will schedule her appointment with them.

## 2015-05-02 NOTE — Telephone Encounter (Signed)
Received a call from cancer center that  We need to order pt a Mammo   ( cancer center call back  # is  [phone])

## 2015-05-08 ENCOUNTER — Other Ambulatory Visit: Payer: Self-pay | Admitting: Family Medicine

## 2015-05-08 ENCOUNTER — Ambulatory Visit
Admission: RE | Admit: 2015-05-08 | Discharge: 2015-05-08 | Disposition: A | Discharge status: Home / Self Care | Payer: Medicare PPO | Source: Ambulatory Visit | Attending: Family Medicine | Admitting: Family Medicine

## 2015-05-08 DIAGNOSIS — Z1231 Encounter for screening mammogram for malignant neoplasm of breast: Secondary | ICD-10-CM | POA: Insufficient documentation

## 2015-05-08 DIAGNOSIS — Z0000 Encounter for general adult medical examination without abnormal findings: Secondary | ICD-10-CM | POA: Diagnosis present

## 2015-06-25 ENCOUNTER — Encounter: Payer: Self-pay | Admitting: Urology

## 2015-06-26 ENCOUNTER — Encounter: Payer: Self-pay | Admitting: Urology

## 2015-06-26 ENCOUNTER — Ambulatory Visit (INDEPENDENT_AMBULATORY_CARE_PROVIDER_SITE_OTHER): Payer: Medicare PPO | Admitting: Urology

## 2015-06-26 VITALS — BP 116/67 | HR 71 | Ht 65.0 in | Wt 175.7 lb

## 2015-06-26 DIAGNOSIS — N951 Menopausal and female climacteric states: Secondary | ICD-10-CM | POA: Insufficient documentation

## 2015-06-26 DIAGNOSIS — M1710 Unilateral primary osteoarthritis, unspecified knee: Secondary | ICD-10-CM | POA: Insufficient documentation

## 2015-06-26 DIAGNOSIS — E785 Hyperlipidemia, unspecified: Secondary | ICD-10-CM | POA: Insufficient documentation

## 2015-06-26 DIAGNOSIS — N329 Bladder disorder, unspecified: Secondary | ICD-10-CM | POA: Insufficient documentation

## 2015-06-26 DIAGNOSIS — C4499 Other specified malignant neoplasm of skin, unspecified: Secondary | ICD-10-CM

## 2015-06-26 DIAGNOSIS — N369 Urethral disorder, unspecified: Secondary | ICD-10-CM

## 2015-06-26 DIAGNOSIS — C519 Malignant neoplasm of vulva, unspecified: Secondary | ICD-10-CM | POA: Insufficient documentation

## 2015-06-26 DIAGNOSIS — L989 Disorder of the skin and subcutaneous tissue, unspecified: Secondary | ICD-10-CM | POA: Insufficient documentation

## 2015-06-26 DIAGNOSIS — M179 Osteoarthritis of knee, unspecified: Secondary | ICD-10-CM | POA: Insufficient documentation

## 2015-06-26 LAB — URINALYSIS, COMPLETE
Bilirubin, UA: NEGATIVE
GLUCOSE, UA: NEGATIVE
Ketones, UA: NEGATIVE
LEUKOCYTES UA: NEGATIVE
Nitrite, UA: NEGATIVE
PROTEIN UA: NEGATIVE
RBC UA: NEGATIVE
SPEC GRAV UA: 1.015 (ref 1.005–1.030)
UUROB: 0.2 mg/dL (ref 0.2–1.0)
pH, UA: 7 (ref 5.0–7.5)

## 2015-06-26 LAB — MICROSCOPIC EXAMINATION
Bacteria, UA: NONE SEEN
Epithelial Cells (non renal): NONE SEEN /hpf (ref 0–10)

## 2015-06-26 MED ORDER — CIPROFLOXACIN HCL 500 MG PO TABS
500.0000 mg | ORAL_TABLET | Freq: Once | ORAL | Status: AC
  Administered 2015-06-26: 500 mg via ORAL

## 2015-06-26 MED ORDER — LIDOCAINE HCL 2 % EX GEL
1.0000 "application " | Freq: Once | CUTANEOUS | Status: AC
  Administered 2015-06-26: 1 via URETHRAL

## 2015-06-26 NOTE — Progress Notes (Signed)
06/26/2015 10:55 AM   Carla Sparks [birth year] [id number]  Referring provider: Arlis Porta., MD [place], [place] [address]  Chief Complaint  Patient presents with  . Cysto    HPI: 81 year old female followed for history of vulvar Paget's status post multiple excisions/reconstructive surgeries who initially presented to our office in fall 2015 with an enlarging periurethral lesion. Biopsy was consistent with a urethral caruncle with invasions of her Paget's disease. Second opinion at [facility] by Dr. Tommie Ard confirmed this. There is no evidence of urethral or urothelial carcinoma.  Cystoscopy at the time revealed follicular cystitis with normal bilateral retrograde pyelograms.  She is also followed by Dr. Theora Gianotti at [facility] oncology. She has tried Aldara cream but was unable to tolerate the course. Since then, she is just been using Aquaphor for wound care/ comfort. Since her last visit 6 months ago, her lesion remains stable per her husband.  She is due to see Dr. Theora Gianotti in January 2017.  No difficulty voiding or urinary complaints today.  She returns today for pelvic exam and cystoscopy.   PMH: Past Medical History  Diagnosis Date  . Essential hypertension, benign   . Mixed hyperlipidemia   . Insomnia   . Osteoarthritis   . Paget's disease of vulva   . HTN (hypertension)   . Arthritis   . Lesion of urethra   . Bleeding from the urethra     Surgical History: Past Surgical History  Procedure Laterality Date  . Tonsillectomy    . Vulvectomy    . Total abdominal hysterectomy w/ bilateral salpingoophorectomy    . External hemorrhoid surgery    . Gallbladder surgery      Home Medications:    Medication List       This list is accurate as of: 06/26/15 10:55 AM.  Always use your most recent med list.               amLODipine 5 MG tablet  Commonly known as:  NORVASC  Take 1 tablet (5 mg total) by mouth daily.     aspirin 81 MG tablet    Take 81 mg by mouth daily. Holding due to surgery.     atenolol 100 MG tablet  Commonly known as:  TENORMIN  Take 100 mg by mouth daily.     hydrochlorothiazide 25 MG tablet  Commonly known as:  HYDRODIURIL  Take 25 mg by mouth daily.     lisinopril 40 MG tablet  Commonly known as:  PRINIVIL,ZESTRIL  Take 40 mg by mouth daily.     Melatonin 5 MG Tabs  Take 5 mg by mouth.     sertraline 50 MG tablet  Commonly known as:  ZOLOFT     simvastatin 40 MG tablet  Commonly known as:  ZOCOR  Take 40 mg by mouth daily.        Allergies:  Allergies  Allergen Reactions  . Percocet [Oxycodone-Acetaminophen] Rash    Family History: Family History  Problem Relation Age of Onset  . Heart disease Father     Diagnosed age 60  . Kidney cancer Father   . Heart failure Mother   . Cancer Brother   . Breast cancer Neg Hx     Social History:  reports that she has never smoked. She has never used smokeless tobacco. She reports that she does not drink alcohol or use illicit drugs.  ROS: UROLOGY Frequent Urination?: No Hard to postpone urination?: No  Burning/pain with urination?: No Get up at night to urinate?: Yes Leakage of urine?: No Urine stream starts and stops?: No Trouble starting stream?: No Do you have to strain to urinate?: No Blood in urine?: No Urinary tract infection?: No Sexually transmitted disease?: No Injury to kidneys or bladder?: No Painful intercourse?: No Weak stream?: No Currently pregnant?: No Vaginal bleeding?: No Last menstrual period?: n  Gastrointestinal Nausea?: No Vomiting?: No Indigestion/heartburn?: No Diarrhea?: No Constipation?: No  Constitutional Fever: No Night sweats?: No Weight loss?: No Fatigue?: No  Skin Skin rash/lesions?: No Itching?: No  Eyes Blurred vision?: No Double vision?: No  Ears/Nose/Throat Sore throat?: No Sinus problems?: No  Hematologic/Lymphatic Swollen glands?: No Easy bruising?:  No  Cardiovascular Leg swelling?: No Chest pain?: No  Respiratory Cough?: No Shortness of breath?: No  Endocrine Excessive thirst?: No  Musculoskeletal Back pain?: No Joint pain?: No  Neurological Headaches?: No Dizziness?: No  Psychologic Depression?: No Anxiety?: No  Physical Exam: BP 116/67 mmHg  Pulse 71  Ht 5\' 5"  (1.651 m)  Wt 175 lb 11.2 oz (79.697 kg)  BMI 29.24 kg/m2  Constitutional:  Alert and oriented, No acute distress. HEENT: [name], moist mucus membranes.  Trachea midline, no masses. Cardiovascular: No clubbing, cyanosis, or edema. Respiratory: Normal respiratory effort, no increased work of breathing. GI: Abdomen is soft, nontender, nondistended, no abdominal masses GU: No CVA tenderness. Pelvic exam today shows vulva with evidence of multiple reconstructive surgeries.  There is a stable, beefy, neoplastic lesion extending from the urethra inferiorly approximately 1 cm in length with a somewhat patulous urethral meatus. This appears relatively stable compared to previous exams.  No other areas of obvious Paget's disease involvement throughout the vulva or vagina. Neurologic: Grossly intact, no focal deficits, moving all 4 extremities. Psychiatric: Normal mood and affect.  Laboratory Data: Lab Results  Component Value Date   WBC 6.5 04/30/2015   HGB 14.1 03/19/2014   HCT 42.6 04/30/2015   MCV 92 03/19/2014   PLT 228 03/19/2014    Lab Results  Component Value Date   CREATININE 0.91 04/30/2015    Lab Results  Component Value Date   HGBA1C 5.9* 04/30/2015    Urinalysis UA today with a negative dip. No WBCs or RBCs seen on microscopic exam.    Cystoscopy Procedure Note  Patient identification was confirmed, informed consent was obtained, and patient was prepped using Betadine solution.  Lidocaine jelly was administered per urethral meatus.    Preoperative abx where received prior to procedure.    Procedure: - Flexible cystoscope  introduced, without any difficulty.   - Thorough search of the bladder revealed:    Involvement of Paget's disease only involving the superficial urethral meatus not extending significantly into the urethra; mid and proximal urethra completely spared.    normal urothelium    no stones    no ulcers     no tumors    no urethral polyps    no trabeculation  - Ureteral orifices were normal in position and appearance.  Post-Procedure: - Patient tolerated the procedure well   Assessment & Plan:  81 year old female with extramammary Paget's disease involving external urethral meatus. Her disease appears to be stable over the past year.  No progression of urethral involvement and no evidence of bladder pathology on cysto today including resolution of follicular cystitis.  She would prefer to avoid any significant intervention if possible including wide excision/ urethrectomy.  Unable to tolerate Aldera. She and her husband are very reliable  and is a good candidate for continuation of surveillance only.  We discussed need for return sooner if there is evidence of progression or change.   1. Lesion of urethra - Urinalysis, Complete - ciprofloxacin (CIPRO) tablet 500 mg; Take 1 tablet (500 mg total) by mouth once. - lidocaine (XYLOCAINE) 2 % jelly 1 application; Place 1 application into the urethra once.  2. Extramammary Paget disease   Return in about 1 year (around 06/25/2016) for cystoscopy/ pelvic exam.  Hollice Espy, MD  [facility] [address] [place], [place] [address] [phone]

## 2015-06-27 ENCOUNTER — Other Ambulatory Visit: Payer: Self-pay | Admitting: Urology

## 2015-07-09 ENCOUNTER — Ambulatory Visit: Payer: Medicare PPO

## 2015-07-24 ENCOUNTER — Telehealth: Payer: Self-pay | Admitting: *Deleted

## 2015-07-24 NOTE — Telephone Encounter (Signed)
In attempt to lighten md schedule, contacted patient to see if she would be willing to come on 07/30/15 instead of 08/06/15. She could see Dr. Fransisca Connors. Carla patient states that she would rather see Dr. Theora Gianotti instead on 08/06/15.

## 2015-08-06 ENCOUNTER — Encounter: Payer: Self-pay | Admitting: Obstetrics and Gynecology

## 2015-08-06 ENCOUNTER — Inpatient Hospital Stay: Payer: Medicare PPO | Attending: Obstetrics and Gynecology | Admitting: Obstetrics and Gynecology

## 2015-08-06 VITALS — BP 168/77 | HR 62 | Temp 98.2°F | Wt 177.2 lb

## 2015-08-06 DIAGNOSIS — C519 Malignant neoplasm of vulva, unspecified: Secondary | ICD-10-CM | POA: Diagnosis not present

## 2015-08-06 DIAGNOSIS — Z7982 Long term (current) use of aspirin: Secondary | ICD-10-CM | POA: Insufficient documentation

## 2015-08-06 DIAGNOSIS — I10 Essential (primary) hypertension: Secondary | ICD-10-CM | POA: Insufficient documentation

## 2015-08-06 DIAGNOSIS — M170 Bilateral primary osteoarthritis of knee: Secondary | ICD-10-CM | POA: Diagnosis not present

## 2015-08-06 DIAGNOSIS — Z79899 Other long term (current) drug therapy: Secondary | ICD-10-CM | POA: Insufficient documentation

## 2015-08-06 DIAGNOSIS — N951 Menopausal and female climacteric states: Secondary | ICD-10-CM | POA: Insufficient documentation

## 2015-08-06 DIAGNOSIS — R1900 Intra-abdominal and pelvic swelling, mass and lump, unspecified site: Secondary | ICD-10-CM

## 2015-08-06 NOTE — Progress Notes (Signed)
Gynecologic Oncology Interval Note  Referring physician: Dr. Ellene Route  Chief Complaint: Paget's disease of the vulva and urethra  Subjective:  Carla Sparks is a 82 y.o. woman who presents today for continued surveillance for history of Paget's disease of the vulva and urethra.   She saw Dr. Erlene Quan on 06/26/2015 and had a stable exam with negative cystoscopy for follicular cystitis. Per her husband's report the lesion is stable.   Oncology Treatment History:  The patient is a pleasant patient with a long history of Paget's disease of the vulva. Her history dates back to the 1980s when she recurrent episodes of Paget's disease of the vulva with multiple resections, and in 2009 a vulvectomy with Dr. Clarene Essex.  On 12/05/2012 she underwent vulvar biopsy that demonstrated recurrent Paget's. Pap was negative. On 02/07/2013 she underwent partial vulvectomy of 2 lesions by Dr. Sabra Heck at [facility] with pathology demonstrating Paget's disease.  At a surveillance visit in 2015 she was noted to have a abnormal appearing urethral lesion and she was referred to Dr. Erlene Quan. She underwent cystoscopy, bilateral retrograde pyelograms, bladder biopsy, urethral biopsy, and interpretation of fluoroscopy less than 30 minutes. The urethral biopsy was positive for Paget's disease. Cystoscopy and bladder biopsies were consistent with cystitis. She was referred to Dr. Bjorn Loser for evaluation. He specializes in reconstructive incontinence procedures. He recommended a second opinion with Dr. Tresa Moore which she declined. She opted not to proceed with surgical intervention.  She did try a trial of topical Efudex therapy. She was able to use this treatment for 5 weeks but stated that her skin became very erythematous. She stop using the medication at that time and it took about 2 weeks for the skin to recover.   She applies Aquaphor to the urethral area for comfort.   Problem List: Patient Active Problem List   Diagnosis Date Noted  . Malignant neoplastic disease ([place]) 06/26/2015  . Middle insomnia 06/26/2015  . Dyslipidemia 06/26/2015  . Essential (primary) hypertension 06/26/2015  . Lesion of bladder 06/26/2015  . Arthritis of knee, degenerative 06/26/2015  . Paget's disease of vulva 06/26/2015  . Post menopausal syndrome 06/26/2015  . Encounter for other preprocedural examination 06/26/2015  . Encounter for general adult medical examination without abnormal findings 06/26/2015  . Encounter for screening for other disorder 06/26/2015  . At risk for injury 06/26/2015  . Skin lesion 06/26/2015  . Insomnia 04/29/2015  . Paget disease, extra mammary 12/18/2014  . Primary osteoarthritis of both knees 11/04/2014  . Preoperative cardiovascular examination 03/21/2014  . Essential hypertension, benign 03/21/2014  . Mixed hyperlipidemia 03/21/2014    Past Medical History: Past Medical History  Diagnosis Date  . Essential hypertension, benign   . Mixed hyperlipidemia   . Insomnia   . Osteoarthritis   . Paget's disease of vulva   . HTN (hypertension)   . Arthritis   . Lesion of urethra   . Bleeding from the urethra     Past Surgical History: Past Surgical History  Procedure Laterality Date  . Tonsillectomy    . Vulvectomy    . Total abdominal hysterectomy w/ bilateral salpingoophorectomy    . External hemorrhoid surgery    . Gallbladder surgery      Family History: Family History  Problem Relation Age of Onset  . Heart disease Father     Diagnosed age 52  . Kidney cancer Father   . Heart failure Mother   . Cancer Brother   . Breast cancer Neg Hx  Social History: Social History   Social History  . Marital Status: Married    Spouse Name: N/A  . Number of Children: N/A  . Years of Education: N/A   Occupational History  . Not on file.   Social History Main Topics  . Smoking status: Never Smoker   . Smokeless tobacco: Never Used  . Alcohol Use: No  . Drug Use: No   . Sexual Activity: Not on file   Other Topics Concern  . Not on file   Social History Narrative    Allergies: Allergies  Allergen Reactions  . Percocet [Oxycodone-Acetaminophen] Rash    And darvocet    Current Medications: Current Outpatient Prescriptions  Medication Sig Dispense Refill  . amLODipine (NORVASC) 5 MG tablet Take 1 tablet (5 mg total) by mouth daily. 90 tablet 3  . aspirin 81 MG tablet Take 81 mg by mouth daily. Holding due to surgery.    Marland Kitchen atenolol (TENORMIN) 100 MG tablet Take 100 mg by mouth daily.    . hydrochlorothiazide (HYDRODIURIL) 25 MG tablet Take 25 mg by mouth daily.    Marland Kitchen lisinopril (PRINIVIL,ZESTRIL) 40 MG tablet Take 40 mg by mouth daily.    . Melatonin 5 MG TABS Take 5 mg by mouth.    . sertraline (ZOLOFT) 50 MG tablet Reported on 08/06/2015    . simvastatin (ZOCOR) 40 MG tablet Take 40 mg by mouth daily.     No current facility-administered medications for this visit.      Review of Systems A comprehensive review of systems was negative.  Objective:  Physical Examination:  BP 168/77 mmHg  Pulse 62  Temp(Src) 98.2 F (36.8 C) (Oral)  Wt 177 lb 4 oz (80.4 kg)  ECOG Performance Status: 0 - Asymptomatic  General appearance: alert, cooperative and appears stated age 52: ATNC Lymph node survey: negative inguinal nodes Abdomen: lower abdomen soft, nontender, nondistended without masses or ascites Neurological exam reveals alert, oriented, normal speech, no focal findings or movement disorder noted  Pelvic: Vulva: Distorted anatomy was extensive scarring of the external genitalia after multiple resections. The areas noted last time of erythema of the anterior vulva surrounding the urethral meatus and clitoris appear are stable. The urethral meatus still appears to be very abnormal with increased erythema. Vagina: normal vagina; Adnexa: no masses; Uterus: surgically absent, vaginal cuff well healed; Cervix: absent; BME revealed a possible 4  cm mass at the vaginal cuff. Rectal: confirmatory. The mass may represent stool but a pelvic process can not be ruled out.   See photo archived on 08/05/2014  Lab Review Labs on site today: Pap smear pending.   Assessment:   Carla Sparks is a 82 y.o. female with Paget's disease of the vulva and urethra. Urethral carunkle. Possible pelvic mass. Pap negative in 2014.     Plan:   Problem List Items Addressed This Visit    None    Visit Diagnoses    Pelvic mass in female    -  Primary    Relevant Orders    US Pelvis Complete    US Transvaginal Non-OB      She'll be seeing Dr. Erlene Quan in one year. Suggested return to clinic in  6 months if scan negative.    Follow up pelvic ultrasound.   Gillis Ends, MD  CC:  Referring physician: Dr. Ellene Route  Dr. Hollice Espy

## 2015-08-12 ENCOUNTER — Encounter: Payer: Self-pay | Admitting: Family Medicine

## 2015-08-12 ENCOUNTER — Ambulatory Visit (INDEPENDENT_AMBULATORY_CARE_PROVIDER_SITE_OTHER): Payer: Medicare PPO | Admitting: Family Medicine

## 2015-08-12 VITALS — BP 135/60 | HR 59 | Resp 16 | Ht 65.0 in | Wt 177.0 lb

## 2015-08-12 DIAGNOSIS — I10 Essential (primary) hypertension: Secondary | ICD-10-CM

## 2015-08-12 DIAGNOSIS — R7303 Prediabetes: Secondary | ICD-10-CM | POA: Diagnosis not present

## 2015-08-12 DIAGNOSIS — G4700 Insomnia, unspecified: Secondary | ICD-10-CM

## 2015-08-12 DIAGNOSIS — R7309 Other abnormal glucose: Secondary | ICD-10-CM | POA: Insufficient documentation

## 2015-08-12 DIAGNOSIS — M170 Bilateral primary osteoarthritis of knee: Secondary | ICD-10-CM | POA: Diagnosis not present

## 2015-08-12 DIAGNOSIS — E785 Hyperlipidemia, unspecified: Secondary | ICD-10-CM | POA: Diagnosis not present

## 2015-08-12 MED ORDER — LISINOPRIL 40 MG PO TABS: 40.0000 mg | ORAL_TABLET | Freq: Every day | ORAL | Status: DC

## 2015-08-12 MED ORDER — SIMVASTATIN 40 MG PO TABS: 40.0000 mg | ORAL_TABLET | Freq: Every day | ORAL | Status: DC

## 2015-08-12 MED ORDER — ATENOLOL 100 MG PO TABS: 100.0000 mg | ORAL_TABLET | Freq: Every day | ORAL | Status: DC

## 2015-08-12 MED ORDER — SERTRALINE HCL 50 MG PO TABS: 50.0000 mg | ORAL_TABLET | Freq: Every day | ORAL | Status: DC

## 2015-08-12 MED ORDER — HYDROCHLOROTHIAZIDE 25 MG PO TABS: 25.0000 mg | ORAL_TABLET | Freq: Every day | ORAL | Status: DC

## 2015-08-12 MED ORDER — AMLODIPINE BESYLATE 5 MG PO TABS: 5.0000 mg | ORAL_TABLET | Freq: Every day | ORAL | Status: DC

## 2015-08-12 NOTE — Patient Instructions (Signed)
Plan labs on return (CBC, CMP, Lipid panel, TSH, A1c)

## 2015-08-12 NOTE — Progress Notes (Signed)
Name: Carla Sparks   MRN: [id number]    DOB: 1934-05-01   Date:08/12/2015       Progress Note  Subjective  Chief Complaint  Chief Complaint  Patient presents with  . Hypertension    RANGES 107-137/50-70    Hypertension Pertinent negatives include no blurred vision, chest pain, headaches, malaise/fatigue, palpitations or shortness of breath.   Here for f/u of HBP.  Also with hyperlipidemia, insomnia, pre-diabetes.  She is sleeping well with the Zoloft.  No problems with medication.  BPs at home in 110-130/60-70 range.  Urologist has released for 1 year.  Gyn has felt a "mass" in pelvis and has ordered an [place] that will be done tomorrow.  No problem-specific assessment & plan notes found for this encounter.   Past Medical History  Diagnosis Date  . Essential hypertension, benign   . Mixed hyperlipidemia   . Insomnia   . Osteoarthritis   . Paget's disease of vulva   . HTN (hypertension)   . Arthritis   . Lesion of urethra   . Bleeding from the urethra     Past Surgical History  Procedure Laterality Date  . Tonsillectomy    . Vulvectomy    . Total abdominal hysterectomy w/ bilateral salpingoophorectomy    . External hemorrhoid surgery    . Gallbladder surgery      Family History  Problem Relation Age of Onset  . Heart disease Father     Diagnosed age 22  . Kidney cancer Father   . Heart failure Mother   . Cancer Brother   . Breast cancer Neg Hx     Social History   Social History  . Marital Status: Married    Spouse Name: N/A  . Number of Children: N/A  . Years of Education: N/A   Occupational History  . Not on file.   Social History Main Topics  . Smoking status: Never Smoker   . Smokeless tobacco: Never Used  . Alcohol Use: No  . Drug Use: No  . Sexual Activity: Not on file   Other Topics Concern  . Not on file   Social History Narrative     Current outpatient prescriptions:  .  amLODipine (NORVASC) 5 MG tablet, Take 1 tablet (5 mg  total) by mouth daily., Disp: 90 tablet, Rfl: 3 .  aspirin 81 MG tablet, Take 81 mg by mouth daily. Holding due to surgery., Disp: , Rfl:  .  atenolol (TENORMIN) 100 MG tablet, Take 1 tablet (100 mg total) by mouth daily., Disp: 90 tablet, Rfl: 3 .  hydrochlorothiazide (HYDRODIURIL) 25 MG tablet, Take 1 tablet (25 mg total) by mouth daily., Disp: 90 tablet, Rfl: 3 .  lisinopril (PRINIVIL,ZESTRIL) 40 MG tablet, Take 1 tablet (40 mg total) by mouth daily., Disp: 90 tablet, Rfl: 3 .  Melatonin 5 MG TABS, Take 5 mg by mouth., Disp: , Rfl:  .  sertraline (ZOLOFT) 50 MG tablet, Take 1 tablet (50 mg total) by mouth daily. Reported on 08/06/2015, Disp: 90 tablet, Rfl: 3 .  simvastatin (ZOCOR) 40 MG tablet, Take 1 tablet (40 mg total) by mouth daily., Disp: 90 tablet, Rfl: 3  Allergies  Allergen Reactions  . Eggs Or Egg-Derived Products   . Percocet [Oxycodone-Acetaminophen] Rash    And darvocet     Review of Systems  Constitutional: Negative for fever, chills, weight loss and malaise/fatigue.  HENT: Negative for hearing loss.   Eyes: Negative for blurred vision and double vision.  Respiratory:  Negative for cough, shortness of breath and wheezing.   Cardiovascular: Negative for chest pain, palpitations and leg swelling.  Gastrointestinal: Negative for heartburn, nausea, vomiting, abdominal pain, diarrhea and blood in stool.  Genitourinary: Negative for dysuria, urgency and frequency.  Musculoskeletal: Negative for myalgias and joint pain.  Skin: Negative for rash.  Neurological: Negative for dizziness, tremors, weakness and headaches.  Psychiatric/Behavioral: Negative for depression.      Objective  Filed Vitals:   08/12/15 1037 08/12/15 1111  BP: 149/70 135/60  Pulse: 59   Resp: 16   Height: 5\' 5"  (1.651 m)   Weight: 177 lb (80.287 kg)     Physical Exam  Constitutional: She is oriented to person, place, and time and well-developed, well-nourished, and in no distress. No distress.   HENT:  Head: Normocephalic and atraumatic.  Eyes: Conjunctivae and EOM are normal. Pupils are equal, round, and reactive to light. No scleral icterus.  Neck: Normal range of motion. Neck supple. Carotid bruit is not present. No thyromegaly present.  Cardiovascular: Normal rate and normal heart sounds.  Exam reveals no gallop and no friction rub.   No murmur heard. Pulmonary/Chest: Effort normal and breath sounds normal. No respiratory distress. She has no wheezes. She has no rales.  Abdominal: Soft. Bowel sounds are normal. She exhibits no distension, no abdominal bruit and no mass. There is no tenderness.  Musculoskeletal: She exhibits no edema.  Lymphadenopathy:    She has no cervical adenopathy.  Neurological: She is alert and oriented to person, place, and time.  Vitals reviewed.      Recent Results (from the past 2160 hour(s))  Urinalysis, Complete     Status: None   Collection Time: 06/26/15 10:02 AM  Result Value Ref Range   Specific Gravity, UA 1.015 1.005 - 1.030   pH, UA 7.0 5.0 - 7.5   Color, UA Yellow Yellow   Appearance Ur Clear Clear   Leukocytes, UA Negative Negative   Protein, UA Negative Negative/Trace   Glucose, UA Negative Negative   Ketones, UA Negative Negative   RBC, UA Negative Negative   Bilirubin, UA Negative Negative   Urobilinogen, Ur 0.2 0.2 - 1.0 mg/dL   Nitrite, UA Negative Negative   Microscopic Examination See below:   Microscopic Examination     Status: None   Collection Time: 06/26/15 10:02 AM  Result Value Ref Range   WBC, UA 0-5 0 -  5 /hpf   RBC, UA 0-2 0 -  2 /hpf   Epithelial Cells (non renal) None seen 0 - 10 /hpf   Bacteria, UA None seen None seen/Few     Assessment & Plan  Problem List Items Addressed This Visit      Cardiovascular and Mediastinum   Essential hypertension, benign   Relevant Medications   amLODipine (NORVASC) 5 MG tablet   atenolol (TENORMIN) 100 MG tablet   hydrochlorothiazide (HYDRODIURIL) 25 MG tablet    lisinopril (PRINIVIL,ZESTRIL) 40 MG tablet   simvastatin (ZOCOR) 40 MG tablet   Essential (primary) hypertension - Primary   Relevant Medications   amLODipine (NORVASC) 5 MG tablet   atenolol (TENORMIN) 100 MG tablet   hydrochlorothiazide (HYDRODIURIL) 25 MG tablet   lisinopril (PRINIVIL,ZESTRIL) 40 MG tablet   simvastatin (ZOCOR) 40 MG tablet     Musculoskeletal and Integument   Arthritis of knee, degenerative     Other   Insomnia   Relevant Medications   sertraline (ZOLOFT) 50 MG tablet   Dyslipidemia   Relevant Medications  simvastatin (ZOCOR) 40 MG tablet   Pre-diabetes      Meds ordered this encounter  Medications  . amLODipine (NORVASC) 5 MG tablet    Sig: Take 1 tablet (5 mg total) by mouth daily.    Dispense:  90 tablet    Refill:  3  . atenolol (TENORMIN) 100 MG tablet    Sig: Take 1 tablet (100 mg total) by mouth daily.    Dispense:  90 tablet    Refill:  3  . hydrochlorothiazide (HYDRODIURIL) 25 MG tablet    Sig: Take 1 tablet (25 mg total) by mouth daily.    Dispense:  90 tablet    Refill:  3  . lisinopril (PRINIVIL,ZESTRIL) 40 MG tablet    Sig: Take 1 tablet (40 mg total) by mouth daily.    Dispense:  90 tablet    Refill:  3  . sertraline (ZOLOFT) 50 MG tablet    Sig: Take 1 tablet (50 mg total) by mouth daily. Reported on 08/06/2015    Dispense:  90 tablet    Refill:  3  . simvastatin (ZOCOR) 40 MG tablet    Sig: Take 1 tablet (40 mg total) by mouth daily.    Dispense:  90 tablet    Refill:  3   1. Essential (primary) hypertension  - amLODipine (NORVASC) 5 MG tablet; Take 1 tablet (5 mg total) by mouth daily.  Dispense: 90 tablet; Refill: 3 - atenolol (TENORMIN) 100 MG tablet; Take 1 tablet (100 mg total) by mouth daily.  Dispense: 90 tablet; Refill: 3 - hydrochlorothiazide (HYDRODIURIL) 25 MG tablet; Take 1 tablet (25 mg total) by mouth daily.  Dispense: 90 tablet; Refill: 3 - lisinopril (PRINIVIL,ZESTRIL) 40 MG tablet; Take 1 tablet (40 mg  total) by mouth daily.  Dispense: 90 tablet; Refill: 3  2. Primary osteoarthritis of both knees   3. Insomnia  - sertraline (ZOLOFT) 50 MG tablet; Take 1 tablet (50 mg total) by mouth daily. Reported on 08/06/2015  Dispense: 90 tablet; Refill: 3  4. Dyslipidemia  - simvastatin (ZOCOR) 40 MG tablet; Take 1 tablet (40 mg total) by mouth daily.  Dispense: 90 tablet; Refill: 3  5. Pre-diabetes   6. Essential hypertension, benign  - amLODipine (NORVASC) 5 MG tablet; Take 1 tablet (5 mg total) by mouth daily.  Dispense: 90 tablet; Refill: 3

## 2015-08-13 ENCOUNTER — Ambulatory Visit
Admission: RE | Admit: 2015-08-13 | Discharge: 2015-08-13 | Disposition: A | Discharge status: Home / Self Care | Payer: Medicare PPO | Source: Ambulatory Visit | Attending: Obstetrics and Gynecology | Admitting: Obstetrics and Gynecology

## 2015-08-13 DIAGNOSIS — Z0389 Encounter for observation for other suspected diseases and conditions ruled out: Secondary | ICD-10-CM | POA: Diagnosis not present

## 2015-08-13 DIAGNOSIS — Z90710 Acquired absence of both cervix and uterus: Secondary | ICD-10-CM | POA: Insufficient documentation

## 2015-08-13 DIAGNOSIS — R1900 Intra-abdominal and pelvic swelling, mass and lump, unspecified site: Secondary | ICD-10-CM | POA: Diagnosis not present

## 2015-08-18 ENCOUNTER — Telehealth: Payer: Self-pay | Admitting: *Deleted

## 2015-08-18 NOTE — Telephone Encounter (Signed)
Asking a return call for results of [place] 1/18

## 2015-08-20 ENCOUNTER — Telehealth: Payer: Self-pay | Admitting: *Deleted

## 2015-08-20 NOTE — Telephone Encounter (Signed)
Notified patient of ultrasound results. Report read to patient.

## 2015-08-20 NOTE — Telephone Encounter (Signed)
Attempted to call patient no answer/ did not leave message due to patient confidentiality. Will continue to try to notify her of negative ultrasound results.

## 2015-10-22 DIAGNOSIS — M170 Bilateral primary osteoarthritis of knee: Secondary | ICD-10-CM | POA: Diagnosis not present

## 2016-02-04 ENCOUNTER — Inpatient Hospital Stay: Payer: Medicare PPO | Attending: Obstetrics and Gynecology

## 2016-02-04 ENCOUNTER — Inpatient Hospital Stay (HOSPITAL_BASED_OUTPATIENT_CLINIC_OR_DEPARTMENT_OTHER): Payer: Medicare PPO | Admitting: Obstetrics and Gynecology

## 2016-02-04 VITALS — BP 140/72 | HR 63 | Temp 97.6°F | Ht 65.0 in | Wt 173.4 lb

## 2016-02-04 DIAGNOSIS — Z90710 Acquired absence of both cervix and uterus: Secondary | ICD-10-CM | POA: Insufficient documentation

## 2016-02-04 DIAGNOSIS — C4489 Other specified malignant neoplasm of overlapping sites of skin: Secondary | ICD-10-CM | POA: Diagnosis not present

## 2016-02-04 DIAGNOSIS — Z79899 Other long term (current) drug therapy: Secondary | ICD-10-CM

## 2016-02-04 DIAGNOSIS — N951 Menopausal and female climacteric states: Secondary | ICD-10-CM | POA: Insufficient documentation

## 2016-02-04 DIAGNOSIS — M170 Bilateral primary osteoarthritis of knee: Secondary | ICD-10-CM

## 2016-02-04 DIAGNOSIS — G4700 Insomnia, unspecified: Secondary | ICD-10-CM

## 2016-02-04 DIAGNOSIS — C4499 Other specified malignant neoplasm of skin, unspecified: Secondary | ICD-10-CM

## 2016-02-04 DIAGNOSIS — Z7982 Long term (current) use of aspirin: Secondary | ICD-10-CM

## 2016-02-04 DIAGNOSIS — E782 Mixed hyperlipidemia: Secondary | ICD-10-CM | POA: Diagnosis not present

## 2016-02-04 DIAGNOSIS — I10 Essential (primary) hypertension: Secondary | ICD-10-CM | POA: Insufficient documentation

## 2016-02-04 DIAGNOSIS — Z90722 Acquired absence of ovaries, bilateral: Secondary | ICD-10-CM | POA: Diagnosis not present

## 2016-02-04 NOTE — Progress Notes (Signed)
Patient here for follow up for Paget's disease.

## 2016-02-04 NOTE — Progress Notes (Signed)
Gynecologic Oncology Interval Note  Referring physician: Dr. Ellene Route  Chief Complaint: Paget's disease of the vulva and urethra  Subjective:  Carla Sparks is a 82 y.o. woman who presents today for continued surveillance for history of Paget's disease of the vulva and urethra.   She saw Dr. Erlene Quan on 06/26/2015 and had a stable exam with negative cystoscopy for follicular cystitis. Per her report the lesion is stable.  She does not report and itching or irritation in the vulva.    Dr Theora Gianotti thought she might feel a mass above the vagina at last visit 1/17, but pelvic US negative.   Oncology Treatment History:  The patient is a pleasant patient with a long history of Paget's disease of the vulva. Her history dates back to the 1980s when she recurrent episodes of Paget's disease of the vulva with multiple resections, and in 2009 a vulvectomy with Dr. Clarene Essex.  On 12/05/2012 she underwent vulvar biopsy that demonstrated recurrent Paget's. Pap was negative. On 02/07/2013 she underwent partial vulvectomy of 2 lesions by Dr. Sabra Heck at [facility] with pathology demonstrating Paget's disease.  At a surveillance visit in 2015 she was noted to have a abnormal appearing urethral lesion and she was referred to Dr. Erlene Quan. She underwent cystoscopy, bilateral retrograde pyelograms, bladder biopsy, urethral biopsy, and interpretation of fluoroscopy less than 30 minutes. The urethral biopsy was positive for Paget's disease. Cystoscopy and bladder biopsies were consistent with cystitis. She was referred to Dr. Bjorn Loser for evaluation. He specializes in reconstructive incontinence procedures. He recommended a second opinion with Dr. Tresa Moore which she declined. She opted not to proceed with surgical intervention.  She did try a trial of topical Efudex therapy. She was able to use this treatment for 5 weeks but stated that her skin became very erythematous. She stop using the medication at that time and  it took about 2 weeks for the skin to recover.   She applies Aquaphor to the urethral area for comfort.   Problem List: Patient Active Problem List   Diagnosis Date Noted  . Pre-diabetes 08/12/2015  . Malignant neoplastic disease ([place]) 06/26/2015  . Middle insomnia 06/26/2015  . Dyslipidemia 06/26/2015  . Essential (primary) hypertension 06/26/2015  . Lesion of bladder 06/26/2015  . Arthritis of knee, degenerative 06/26/2015  . Paget's disease of vulva 06/26/2015  . Post menopausal syndrome 06/26/2015  . Encounter for other preprocedural examination 06/26/2015  . Encounter for general adult medical examination without abnormal findings 06/26/2015  . Encounter for screening for other disorder 06/26/2015  . At risk for injury 06/26/2015  . Skin lesion 06/26/2015  . Insomnia 04/29/2015  . Paget disease, extra mammary 12/18/2014  . Primary osteoarthritis of both knees 11/04/2014  . Preoperative cardiovascular examination 03/21/2014  . Essential hypertension, benign 03/21/2014  . Mixed hyperlipidemia 03/21/2014    Past Medical History: Past Medical History  Diagnosis Date  . Essential hypertension, benign   . Mixed hyperlipidemia   . Insomnia   . Osteoarthritis   . Paget's disease of vulva   . HTN (hypertension)   . Arthritis   . Lesion of urethra   . Bleeding from the urethra     Past Surgical History: Past Surgical History  Procedure Laterality Date  . Tonsillectomy    . Vulvectomy    . Total abdominal hysterectomy w/ bilateral salpingoophorectomy    . External hemorrhoid surgery    . Gallbladder surgery      Family History: Family History  Problem Relation Age of  Onset  . Heart disease Father     Diagnosed age 74  . Kidney cancer Father   . Heart failure Mother   . Cancer Brother   . Breast cancer Neg Hx     Social History: Social History   Social History  . Marital Status: Married    Spouse Name: N/A  . Number of Children: N/A  . Years of  Education: N/A   Occupational History  . Not on file.   Social History Main Topics  . Smoking status: Never Smoker   . Smokeless tobacco: Never Used  . Alcohol Use: No  . Drug Use: No  . Sexual Activity: Not on file   Other Topics Concern  . Not on file   Social History Narrative    Allergies: Allergies  Allergen Reactions  . Eggs Or Egg-Derived Products   . Percocet [Oxycodone-Acetaminophen] Rash    And darvocet    Current Medications: Current Outpatient Prescriptions  Medication Sig Dispense Refill  . amLODipine (NORVASC) 5 MG tablet Take 1 tablet (5 mg total) by mouth daily. 90 tablet 3  . aspirin 81 MG tablet Take 81 mg by mouth daily. Holding due to surgery.    Marland Kitchen atenolol (TENORMIN) 100 MG tablet Take 1 tablet (100 mg total) by mouth daily. 90 tablet 3  . hydrochlorothiazide (HYDRODIURIL) 25 MG tablet Take 1 tablet (25 mg total) by mouth daily. 90 tablet 3  . lisinopril (PRINIVIL,ZESTRIL) 40 MG tablet Take 1 tablet (40 mg total) by mouth daily. 90 tablet 3  . Melatonin 5 MG TABS Take 5 mg by mouth.    . sertraline (ZOLOFT) 50 MG tablet Take 1 tablet (50 mg total) by mouth daily. Reported on 08/06/2015 90 tablet 3  . simvastatin (ZOCOR) 40 MG tablet Take 1 tablet (40 mg total) by mouth daily. 90 tablet 3   No current facility-administered medications for this visit.    Review of Systems A comprehensive review of systems was negative.  Objective:  Physical Examination:  BP 140/72 mmHg  Pulse 63  Temp(Src) 97.6 F (36.4 C) (Tympanic)  Ht 5\' 5"  (1.651 m)  Wt 173 lb 6.3 oz (78.65 kg)  BMI 28.85 kg/m2  ECOG Performance Status: 0 - Asymptomatic  General appearance: alert, cooperative and appears stated age HEENT: ATNC Lymph node survey: negative inguinal nodes Abdomen: lower abdomen soft, nontender, nondistended without masses or ascites Neurological exam reveals alert, oriented, normal speech, no focal findings or movement disorder noted  Pelvic: Vulva: The  labial structures are not present due to prior surgery. No abnormal areas on the vulva concerning for Paget's.  The areas of erythema surrounding the urethral meatus and clitoris appear stable. The urethral meatus still appears to be very abnormal with increased erythema. Vagina: normal vagina; Adnexa: no masses; Uterus: surgically absent, vaginal cuff well healed; Cervix: absent; BME reveals thickening above the vaginal cuff.  Rectal: confirmatory. Likely represents sigmoid colon   See photo archived on 08/05/2014   Assessment:   DENEISHA BURKHOLDER is a 82 y.o. female with Paget's disease of the vulva and urethra. Not symptomatic presently in the vulvar area.  Urethral carunkle. Pap negative in 2014.  Dr. Theora Gianotti thought she might feel a mass above the vagina on the last exam, but pelvic US negative.  I feel this fullness too and suspect it represents the sigmoid colon.      Plan:   Problem List Items Addressed This Visit      Musculoskeletal and Integument  Paget disease, extra mammary - Primary     She'll be seeing Dr. Erlene Quan for follow cystoscopy soon.  Suggested return to clinic in  8 months or sooner if symptoms arise.  We discussed that Karolee Stamps may be an option in the future if she has recurrences of Paget's.    Follow up pelvic ultrasound.   Mellody Drown, MD  CC:  Referring physician: Dr. Ellene Route  Dr. Hollice Espy

## 2016-02-04 NOTE — Progress Notes (Signed)
Oncology Nurse Navigator Documentation  Oncology Nurse Navigator Flowsheets 02/04/2016  Navigator Location [facility]  Navigator Encounter Type Follow-up Appt;6 month  Barriers/Navigation Needs Coordination of Care  Interventions Coordination of Care  Coordination of Care Appts  Time Spent with Patient 15   Chaperoned pelvic. Will return in 8 months or sooner as needed.

## 2016-02-12 ENCOUNTER — Ambulatory Visit (INDEPENDENT_AMBULATORY_CARE_PROVIDER_SITE_OTHER): Payer: Medicare PPO | Admitting: Family Medicine

## 2016-02-12 ENCOUNTER — Encounter: Payer: Self-pay | Admitting: Family Medicine

## 2016-02-12 VITALS — BP 140/60 | HR 62 | Temp 97.8°F | Resp 16 | Ht 65.0 in | Wt 175.0 lb

## 2016-02-12 DIAGNOSIS — I10 Essential (primary) hypertension: Secondary | ICD-10-CM | POA: Diagnosis not present

## 2016-02-12 DIAGNOSIS — E785 Hyperlipidemia, unspecified: Secondary | ICD-10-CM

## 2016-02-12 DIAGNOSIS — G4700 Insomnia, unspecified: Secondary | ICD-10-CM | POA: Diagnosis not present

## 2016-02-12 MED ORDER — ATENOLOL 100 MG PO TABS: ORAL_TABLET | ORAL | Status: DC

## 2016-02-12 NOTE — Patient Instructions (Signed)
Plan to check labs on next visit.

## 2016-02-12 NOTE — Progress Notes (Signed)
Name: Carla Sparks   MRN: [id number]    DOB: Apr 03, 1934   Date:02/12/2016       Progress Note  Subjective  Chief Complaint  Chief Complaint  Patient presents with  . Hypertension    HPI Here for f/u of HBP.  Also with anxiety and Zoloft helps a lot.  Still taking Zocor for cholesterol.  TAking Atenolol, Lisinopril, HCTZ and Amlodiine for BP.  Overall feeling well.  She did slip out of bed 2 nights ago and has an abrasion and a small superficial laceration on L upper eyelid.  No problem-specific assessment & plan notes found for this encounter.   Past Medical History  Diagnosis Date  . Essential hypertension, benign   . Mixed hyperlipidemia   . Insomnia   . Osteoarthritis   . Paget's disease of vulva   . HTN (hypertension)   . Arthritis   . Lesion of urethra   . Bleeding from the urethra     Past Surgical History  Procedure Laterality Date  . Tonsillectomy    . Vulvectomy    . Total abdominal hysterectomy w/ bilateral salpingoophorectomy    . External hemorrhoid surgery    . Gallbladder surgery      Family History  Problem Relation Age of Onset  . Heart disease Father     Diagnosed age 8  . Kidney cancer Father   . Heart failure Mother   . Cancer Brother   . Breast cancer Neg Hx     Social History   Social History  . Marital Status: Married    Spouse Name: N/A  . Number of Children: N/A  . Years of Education: N/A   Occupational History  . Not on file.   Social History Main Topics  . Smoking status: Never Smoker   . Smokeless tobacco: Never Used  . Alcohol Use: No  . Drug Use: No  . Sexual Activity: Not on file   Other Topics Concern  . Not on file   Social History Narrative     Current outpatient prescriptions:  .  amLODipine (NORVASC) 5 MG tablet, Take 1 tablet (5 mg total) by mouth daily., Disp: 90 tablet, Rfl: 3 .  aspirin 81 MG tablet, Take 81 mg by mouth daily. Holding due to surgery., Disp: , Rfl:  .  atenolol (TENORMIN) 100 MG  tablet, Take 1/2 tablet daily, Disp: 90 tablet, Rfl: 3 .  hydrochlorothiazide (HYDRODIURIL) 25 MG tablet, Take 1 tablet (25 mg total) by mouth daily., Disp: 90 tablet, Rfl: 3 .  lisinopril (PRINIVIL,ZESTRIL) 40 MG tablet, Take 1 tablet (40 mg total) by mouth daily., Disp: 90 tablet, Rfl: 3 .  Melatonin 5 MG TABS, Take 5 mg by mouth., Disp: , Rfl:  .  sertraline (ZOLOFT) 50 MG tablet, Take 1 tablet (50 mg total) by mouth daily. Reported on 08/06/2015, Disp: 90 tablet, Rfl: 3 .  simvastatin (ZOCOR) 40 MG tablet, Take 1 tablet (40 mg total) by mouth daily., Disp: 90 tablet, Rfl: 3  Allergies  Allergen Reactions  . Eggs Or Egg-Derived Products   . Percocet [Oxycodone-Acetaminophen] Rash    And darvocet     Review of Systems  Constitutional: Negative for fever, chills, weight loss and malaise/fatigue.  HENT: Negative for hearing loss.   Eyes: Negative for blurred vision and double vision.  Respiratory: Negative for cough, shortness of breath and wheezing.   Cardiovascular: Negative for chest pain, palpitations and leg swelling.  Gastrointestinal: Negative for heartburn, abdominal pain and  blood in stool.  Genitourinary: Negative for dysuria, urgency and frequency.  Skin: Negative for rash.  Neurological: Negative for dizziness, tremors, weakness and headaches.  Psychiatric/Behavioral: The patient is not nervous/anxious.       Objective  Filed Vitals:   02/12/16 1133 02/12/16 1202  BP: 119/72 140/60  Pulse: 62   Temp: 97.8 F (36.6 C)   TempSrc: Oral   Resp: 16   Height: 5\' 5"  (1.651 m)   Weight: 175 lb (79.379 kg)     Physical Exam  Constitutional: She is oriented to person, place, and time and well-developed, well-nourished, and in no distress. No distress.  HENT:  Head: Normocephalic and atraumatic.  Eyes: Conjunctivae and EOM are normal. Pupils are equal, round, and reactive to light. No scleral icterus.  Neck: Normal range of motion. Neck supple. Carotid bruit is not  present. No thyromegaly present.  Cardiovascular: Normal rate, regular rhythm and normal heart sounds.  Exam reveals no gallop and no friction rub.   No murmur heard. Pulmonary/Chest: Effort normal and breath sounds normal. No respiratory distress. She has no wheezes. She has no rales.  Abdominal: Soft. Bowel sounds are normal. She exhibits no distension, no abdominal bruit and no mass. There is no tenderness.  Musculoskeletal: She exhibits no edema.  Lymphadenopathy:    She has no cervical adenopathy.  Neurological: She is alert and oriented to person, place, and time.  Skin:  2 cm superficial laceration of L upper eyelid that is beginning to heal.  Vitals reviewed.      No results found for this or any previous visit (from the past 2160 hour(s)).   Assessment & Plan  Problem List Items Addressed This Visit      Cardiovascular and Mediastinum   Essential (primary) hypertension - Primary   Relevant Medications   atenolol (TENORMIN) 100 MG tablet     Other   Insomnia   Dyslipidemia      Meds ordered this encounter  Medications  . atenolol (TENORMIN) 100 MG tablet    Sig: Take 1/2 tablet daily    Dispense:  90 tablet    Refill:  3   1. Essential (primary) hypertension Cont Amlodipine, HCTZ and Lisinopril - atenolol (TENORMIN) 100 MG tablet; Take 1/2 tablet daily  Dispense: 90 tablet; Refill: 3  2. Dyslipidemia Cont Zocor  3. Insomnia Cont Zoloft

## 2016-03-19 DIAGNOSIS — G8929 Other chronic pain: Secondary | ICD-10-CM | POA: Diagnosis not present

## 2016-03-19 DIAGNOSIS — M25569 Pain in unspecified knee: Secondary | ICD-10-CM | POA: Insufficient documentation

## 2016-03-19 DIAGNOSIS — M25561 Pain in right knee: Secondary | ICD-10-CM | POA: Diagnosis not present

## 2016-03-19 DIAGNOSIS — M170 Bilateral primary osteoarthritis of knee: Secondary | ICD-10-CM | POA: Diagnosis not present

## 2016-03-19 DIAGNOSIS — M25562 Pain in left knee: Secondary | ICD-10-CM | POA: Diagnosis not present

## 2016-04-19 ENCOUNTER — Encounter: Payer: Self-pay | Admitting: *Deleted

## 2016-04-19 ENCOUNTER — Encounter: Payer: Self-pay | Admitting: Family Medicine

## 2016-04-19 ENCOUNTER — Ambulatory Visit (INDEPENDENT_AMBULATORY_CARE_PROVIDER_SITE_OTHER): Payer: Medicare PPO | Admitting: Family Medicine

## 2016-04-19 VITALS — BP 145/70 | HR 65 | Temp 98.0°F | Resp 16 | Ht 65.0 in | Wt 174.0 lb

## 2016-04-19 DIAGNOSIS — E782 Mixed hyperlipidemia: Secondary | ICD-10-CM

## 2016-04-19 DIAGNOSIS — G4700 Insomnia, unspecified: Secondary | ICD-10-CM

## 2016-04-19 DIAGNOSIS — I10 Essential (primary) hypertension: Secondary | ICD-10-CM | POA: Diagnosis not present

## 2016-04-19 DIAGNOSIS — E785 Hyperlipidemia, unspecified: Secondary | ICD-10-CM | POA: Diagnosis not present

## 2016-04-19 DIAGNOSIS — M170 Bilateral primary osteoarthritis of knee: Secondary | ICD-10-CM

## 2016-04-19 MED ORDER — METOPROLOL TARTRATE 25 MG PO TABS: 25.0000 mg | ORAL_TABLET | Freq: Two times a day (BID) | ORAL | 3 refills | Status: DC

## 2016-04-19 NOTE — Patient Instructions (Signed)
Patient declines flu shot.  She is allergic to eggs.?

## 2016-04-19 NOTE — Progress Notes (Signed)
Name: Carla Sparks   MRN: [id number]    DOB: [birth year]   Date:04/19/2016       Progress Note  Subjective  Chief Complaint  Chief Complaint  Patient presents with  . Hypertension    HPI Here for f/u of HBP.  Sleep doing well at present.  She is feeling well overall.  No problem-specific Assessment & Plan notes found for this encounter.   Past Medical History:  Diagnosis Date  . Arthritis   . Bleeding from the urethra   . Essential hypertension, benign   . HTN (hypertension)   . Insomnia   . Lesion of urethra   . Mixed hyperlipidemia   . Osteoarthritis   . Paget's disease of vulva     Past Surgical History:  Procedure Laterality Date  . External hemorrhoid surgery    . GALLBLADDER SURGERY    . TONSILLECTOMY    . TOTAL ABDOMINAL HYSTERECTOMY W/ BILATERAL SALPINGOOPHORECTOMY    . VULVECTOMY      Family History  Problem Relation Age of Onset  . Heart disease Father     Diagnosed age 66  . Kidney cancer Father   . Heart failure Mother   . Cancer Brother   . Breast cancer Neg Hx     Social History   Social History  . Marital status: Married    Spouse name: N/A  . Number of children: N/A  . Years of education: N/A   Occupational History  . Not on file.   Social History Main Topics  . Smoking status: Never Smoker  . Smokeless tobacco: Never Used  . Alcohol use No  . Drug use: No  . Sexual activity: Not on file   Other Topics Concern  . Not on file   Social History Narrative  . No narrative on file     Current Outpatient Prescriptions:  .  amLODipine (NORVASC) 5 MG tablet, Take 1 tablet (5 mg total) by mouth daily., Disp: 90 tablet, Rfl: 3 .  aspirin 81 MG tablet, Take 81 mg by mouth daily. Holding due to surgery., Disp: , Rfl:  .  hydrochlorothiazide (HYDRODIURIL) 25 MG tablet, Take 1 tablet (25 mg total) by mouth daily., Disp: 90 tablet, Rfl: 3 .  lisinopril (PRINIVIL,ZESTRIL) 40 MG tablet, Take 1 tablet (40 mg total) by mouth daily., Disp:  90 tablet, Rfl: 3 .  Melatonin 5 MG TABS, Take 5 mg by mouth at bedtime as needed. , Disp: , Rfl:  .  sertraline (ZOLOFT) 50 MG tablet, Take 1 tablet (50 mg total) by mouth daily. Reported on 08/06/2015, Disp: 90 tablet, Rfl: 3 .  simvastatin (ZOCOR) 40 MG tablet, Take 1 tablet (40 mg total) by mouth daily., Disp: 90 tablet, Rfl: 3 .  metoprolol tartrate (LOPRESSOR) 25 MG tablet, Take 1 tablet (25 mg total) by mouth 2 (two) times daily., Disp: 180 tablet, Rfl: 3  Allergies  Allergen Reactions  . Eggs Or Egg-Derived Products   . Percocet [Oxycodone-Acetaminophen] Rash    And darvocet     Review of Systems  Constitutional: Negative for chills, fever, malaise/fatigue and weight loss.  HENT: Negative for hearing loss.   Eyes: Negative for blurred vision and double vision.  Respiratory: Negative for cough, shortness of breath and wheezing.   Cardiovascular: Negative for chest pain, orthopnea and leg swelling.  Gastrointestinal: Positive for heartburn (with certain foods). Negative for abdominal pain and blood in stool.  Genitourinary: Negative for dysuria, frequency and urgency.  Musculoskeletal: Negative for  joint pain and myalgias.  Skin: Negative for rash.  Neurological: Negative for dizziness, tremors, weakness and headaches.      Objective  Vitals:   04/20/[birth year] 1053 04/19/16 1120  BP: 125/71 (!) 145/70  Pulse: 65   Resp: 16   Temp: 98 F (36.7 C)   TempSrc: Oral   Weight: 174 lb (78.9 kg)   Height: 5\' 5"  (1.651 m)     Physical Exam  Constitutional: She is oriented to person, place, and time. No distress.  HENT:  Head: Normocephalic and atraumatic.  Eyes: Conjunctivae and EOM are normal. Pupils are equal, round, and reactive to light. No scleral icterus.  Neck: Normal range of motion. Carotid bruit is not present. No thyromegaly present.  Cardiovascular: Normal rate, regular rhythm and normal heart sounds.  Exam reveals no gallop and no friction rub.   No murmur  heard. Pulmonary/Chest: Effort normal and breath sounds normal. No respiratory distress. She has no wheezes. She has no rales.  Musculoskeletal: She exhibits no edema.  Lymphadenopathy:    She has no cervical adenopathy.  Neurological: She is alert and oriented to person, place, and time.  Vitals reviewed.      No results found for this or any previous visit (from the past 2160 hour(s)).   Assessment & Plan  Problem List Items Addressed This Visit      Cardiovascular and Mediastinum   Essential (primary) hypertension   Relevant Medications   metoprolol tartrate (LOPRESSOR) 25 MG tablet     Musculoskeletal and Integument   Arthritis of knee, degenerative - Primary     Other   Mixed hyperlipidemia   Relevant Medications   metoprolol tartrate (LOPRESSOR) 25 MG tablet   Insomnia   Dyslipidemia    Other Visit Diagnoses   None.     Meds ordered this encounter  Medications  . metoprolol tartrate (LOPRESSOR) 25 MG tablet    Sig: Take 1 tablet (25 mg total) by mouth 2 (two) times daily.    Dispense:  180 tablet    Refill:  3   1. Essential (primary) hypertension  - metoprolol tartrate (LOPRESSOR) 25 MG tablet; Take 1 tablet (25 mg total) by mouth 2 (two) times daily.  Dispense: 180 tablet; Refill: 3 Cont Lisinopril, HCTZ and Amlodipine Stop Atenolol 2. Primary osteoarthritis of both knees   3. Mixed hyperlipidemia Cont meds  4. Insomnia Cont meds

## 2016-05-28 ENCOUNTER — Other Ambulatory Visit: Payer: Self-pay | Admitting: Family Medicine

## 2016-05-28 DIAGNOSIS — Z1231 Encounter for screening mammogram for malignant neoplasm of breast: Secondary | ICD-10-CM

## 2016-06-14 ENCOUNTER — Telehealth: Payer: Self-pay | Admitting: Urology

## 2016-06-14 NOTE — Telephone Encounter (Signed)
FYI  Patient cancelled her cysto appt and did not want to reschd. She did not give a reason.   Sharyn Lull

## 2016-06-22 ENCOUNTER — Encounter: Payer: Self-pay | Admitting: Family Medicine

## 2016-06-22 ENCOUNTER — Ambulatory Visit (INDEPENDENT_AMBULATORY_CARE_PROVIDER_SITE_OTHER): Payer: Medicare PPO | Admitting: Family Medicine

## 2016-06-22 DIAGNOSIS — G4700 Insomnia, unspecified: Secondary | ICD-10-CM

## 2016-06-22 DIAGNOSIS — I10 Essential (primary) hypertension: Secondary | ICD-10-CM | POA: Diagnosis not present

## 2016-06-22 DIAGNOSIS — E785 Hyperlipidemia, unspecified: Secondary | ICD-10-CM | POA: Diagnosis not present

## 2016-06-22 MED ORDER — SIMVASTATIN 40 MG PO TABS: 40.0000 mg | ORAL_TABLET | Freq: Every day | ORAL | 3 refills | Status: DC

## 2016-06-22 MED ORDER — AMLODIPINE BESYLATE 5 MG PO TABS: 5.0000 mg | ORAL_TABLET | Freq: Every day | ORAL | 3 refills | Status: DC

## 2016-06-22 MED ORDER — SERTRALINE HCL 50 MG PO TABS: 50.0000 mg | ORAL_TABLET | Freq: Every day | ORAL | 3 refills | Status: DC

## 2016-06-22 MED ORDER — LISINOPRIL 40 MG PO TABS: 40.0000 mg | ORAL_TABLET | Freq: Every day | ORAL | 3 refills | Status: DC

## 2016-06-22 MED ORDER — METOPROLOL TARTRATE 25 MG PO TABS: 25.0000 mg | ORAL_TABLET | Freq: Two times a day (BID) | ORAL | 3 refills | Status: DC

## 2016-06-22 MED ORDER — HYDROCHLOROTHIAZIDE 25 MG PO TABS: 25.0000 mg | ORAL_TABLET | Freq: Every day | ORAL | 3 refills | Status: DC

## 2016-06-22 NOTE — Progress Notes (Signed)
Name: Carla Sparks   MRN: [id number]    DOB: 06/21/1934   Date:06/22/2016       Progress Note  Subjective  Chief Complaint  Chief Complaint  Patient presents with  . Follow-up    Hypertension   . Hyperlipidemia    HPI Here for f/u of HBP and elevated lipids.  Taking all meds and feeling well overall.  Zoloft help[s sleep.  No problem-specific Assessment & Plan notes found for this encounter.   Past Medical History:  Diagnosis Date  . Arthritis   . Bleeding from the urethra   . Essential hypertension, benign   . HTN (hypertension)   . Insomnia   . Lesion of urethra   . Mixed hyperlipidemia   . Osteoarthritis   . Paget's disease of vulva     Past Surgical History:  Procedure Laterality Date  . External hemorrhoid surgery    . GALLBLADDER SURGERY    . TONSILLECTOMY    . TOTAL ABDOMINAL HYSTERECTOMY W/ BILATERAL SALPINGOOPHORECTOMY    . VULVECTOMY      Family History  Problem Relation Age of Onset  . Heart disease Father     Diagnosed age 41  . Kidney cancer Father   . Heart failure Mother   . Cancer Brother   . Breast cancer Neg Hx     Social History   Social History  . Marital status: Married    Spouse name: N/A  . Number of children: N/A  . Years of education: N/A   Occupational History  . Not on file.   Social History Main Topics  . Smoking status: Never Smoker  . Smokeless tobacco: Never Used  . Alcohol use No  . Drug use: No  . Sexual activity: Not on file   Other Topics Concern  . Not on file   Social History Narrative  . No narrative on file     Current Outpatient Prescriptions:  .  amLODipine (NORVASC) 5 MG tablet, Take 1 tablet (5 mg total) by mouth daily., Disp: 90 tablet, Rfl: 3 .  aspirin 81 MG tablet, Take 81 mg by mouth daily. , Disp: , Rfl:  .  hydrochlorothiazide (HYDRODIURIL) 25 MG tablet, Take 1 tablet (25 mg total) by mouth daily., Disp: 90 tablet, Rfl: 3 .  lisinopril (PRINIVIL,ZESTRIL) 40 MG tablet, Take 1 tablet  (40 mg total) by mouth daily., Disp: 90 tablet, Rfl: 3 .  Melatonin 5 MG TABS, Take 5 mg by mouth at bedtime as needed. , Disp: , Rfl:  .  metoprolol tartrate (LOPRESSOR) 25 MG tablet, Take 1 tablet (25 mg total) by mouth 2 (two) times daily., Disp: 180 tablet, Rfl: 3 .  sertraline (ZOLOFT) 50 MG tablet, Take 1 tablet (50 mg total) by mouth daily. Reported on 08/06/2015, Disp: 90 tablet, Rfl: 3 .  simvastatin (ZOCOR) 40 MG tablet, Take 1 tablet (40 mg total) by mouth daily., Disp: 90 tablet, Rfl: 3  Allergies  Allergen Reactions  . Eggs Or Egg-Derived Products   . Percocet [Oxycodone-Acetaminophen] Rash    And darvocet     Review of Systems  Constitutional: Negative for chills, fever, malaise/fatigue and weight loss.  HENT: Negative for hearing loss and tinnitus.   Eyes: Negative for blurred vision and double vision.  Respiratory: Negative for cough, shortness of breath and wheezing.   Cardiovascular: Negative for chest pain, palpitations and claudication.  Gastrointestinal: Negative for abdominal pain, blood in stool and heartburn.  Genitourinary: Negative for dysuria, frequency and urgency.  Musculoskeletal: Negative for joint pain and myalgias.  Skin: Negative for rash.  Neurological: Negative for dizziness, tingling, tremors, weakness and headaches.  Psychiatric/Behavioral: The patient does not have insomnia.       Objective  Vitals:   06/22/16 1040 06/22/16 1123  BP: (!) 144/73 130/65  Pulse: 67   Resp: 16   Temp: 98.8 F (37.1 C)   TempSrc: Oral   Weight: 178 lb (80.7 kg)   Height: 5\' 5"  (1.651 m)     Physical Exam  Constitutional: She is oriented to person, place, and time and well-developed, well-nourished, and in no distress. No distress.  HENT:  Head: Normocephalic and atraumatic.  Eyes: Conjunctivae and EOM are normal. Pupils are equal, round, and reactive to light. No scleral icterus.  Neck: Normal range of motion. Neck supple. Carotid bruit is not present.  No thyromegaly present.  Cardiovascular: Normal rate, regular rhythm and normal heart sounds.  Exam reveals no gallop and no friction rub.   No murmur heard. Pulmonary/Chest: Effort normal and breath sounds normal. No respiratory distress. She has no wheezes. She has no rales.  Abdominal: Soft. Bowel sounds are normal. She exhibits no distension and no mass. There is no tenderness.  Musculoskeletal: She exhibits no edema.  Lymphadenopathy:    She has no cervical adenopathy.  Neurological: She is alert and oriented to person, place, and time.  Vitals reviewed.      No results found for this or any previous visit (from the past 2160 hour(s)).   Assessment & Plan  Problem List Items Addressed This Visit      Cardiovascular and Mediastinum   Essential hypertension, benign   Relevant Medications   amLODipine (NORVASC) 5 MG tablet   hydrochlorothiazide (HYDRODIURIL) 25 MG tablet   simvastatin (ZOCOR) 40 MG tablet   metoprolol tartrate (LOPRESSOR) 25 MG tablet   lisinopril (PRINIVIL,ZESTRIL) 40 MG tablet   Other Relevant Orders   COMPLETE METABOLIC PANEL WITH GFR   CBC with Differential   Essential (primary) hypertension   Relevant Medications   amLODipine (NORVASC) 5 MG tablet   hydrochlorothiazide (HYDRODIURIL) 25 MG tablet   simvastatin (ZOCOR) 40 MG tablet   metoprolol tartrate (LOPRESSOR) 25 MG tablet   lisinopril (PRINIVIL,ZESTRIL) 40 MG tablet     Other   Insomnia   Relevant Medications   sertraline (ZOLOFT) 50 MG tablet   Dyslipidemia   Relevant Medications   simvastatin (ZOCOR) 40 MG tablet   Other Relevant Orders   Lipid Profile      Meds ordered this encounter  Medications  . amLODipine (NORVASC) 5 MG tablet    Sig: Take 1 tablet (5 mg total) by mouth daily.    Dispense:  90 tablet    Refill:  3  . hydrochlorothiazide (HYDRODIURIL) 25 MG tablet    Sig: Take 1 tablet (25 mg total) by mouth daily.    Dispense:  90 tablet    Refill:  3  . simvastatin  (ZOCOR) 40 MG tablet    Sig: Take 1 tablet (40 mg total) by mouth daily.    Dispense:  90 tablet    Refill:  3  . sertraline (ZOLOFT) 50 MG tablet    Sig: Take 1 tablet (50 mg total) by mouth daily. Reported on 08/06/2015    Dispense:  90 tablet    Refill:  3  . metoprolol tartrate (LOPRESSOR) 25 MG tablet    Sig: Take 1 tablet (25 mg total) by mouth 2 (two) times daily.  Dispense:  180 tablet    Refill:  3  . lisinopril (PRINIVIL,ZESTRIL) 40 MG tablet    Sig: Take 1 tablet (40 mg total) by mouth daily.    Dispense:  90 tablet    Refill:  3   1. Essential hypertension, benign  - amLODipine (NORVASC) 5 MG tablet; Take 1 tablet (5 mg total) by mouth daily.  Dispense: 90 tablet; Refill: 3 - COMPLETE METABOLIC PANEL WITH GFR - CBC with Differential  2. Essential (primary) hypertension  - amLODipine (NORVASC) 5 MG tablet; Take 1 tablet (5 mg total) by mouth daily.  Dispense: 90 tablet; Refill: 3 - hydrochlorothiazide (HYDRODIURIL) 25 MG tablet; Take 1 tablet (25 mg total) by mouth daily.  Dispense: 90 tablet; Refill: 3 - metoprolol tartrate (LOPRESSOR) 25 MG tablet; Take 1 tablet (25 mg total) by mouth 2 (two) times daily.  Dispense: 180 tablet; Refill: 3 - lisinopril (PRINIVIL,ZESTRIL) 40 MG tablet; Take 1 tablet (40 mg total) by mouth daily.  Dispense: 90 tablet; Refill: 3  3. Dyslipidemia  - simvastatin (ZOCOR) 40 MG tablet; Take 1 tablet (40 mg total) by mouth daily.  Dispense: 90 tablet; Refill: 3 - Lipid Profile  4. Insomnia, unspecified type  - sertraline (ZOLOFT) 50 MG tablet; Take 1 tablet (50 mg total) by mouth daily. Reported on 08/06/2015  Dispense: 90 tablet; Refill: 3

## 2016-06-25 ENCOUNTER — Other Ambulatory Visit: Payer: Medicare PPO | Admitting: Urology

## 2016-06-25 ENCOUNTER — Other Ambulatory Visit: Payer: Medicare PPO

## 2016-07-02 ENCOUNTER — Other Ambulatory Visit: Payer: Medicare PPO

## 2016-07-02 DIAGNOSIS — I10 Essential (primary) hypertension: Secondary | ICD-10-CM | POA: Diagnosis not present

## 2016-07-02 DIAGNOSIS — E785 Hyperlipidemia, unspecified: Secondary | ICD-10-CM | POA: Diagnosis not present

## 2016-07-03 LAB — CBC WITH DIFFERENTIAL/PLATELET
BASOS ABS: 0 {cells}/uL (ref 0–200)
Basophils Relative: 0 %
EOS PCT: 2 %
Eosinophils Absolute: 182 cells/uL (ref 15–500)
HCT: 40.2 % (ref 35.0–45.0)
HEMOGLOBIN: 13.5 g/dL (ref 11.7–15.5)
Lymphocytes Relative: 26 %
Lymphs Abs: 2366 cells/uL (ref 850–3900)
MCH: 30.8 pg (ref 27.0–33.0)
MCHC: 33.6 g/dL (ref 32.0–36.0)
MCV: 91.6 fL (ref 80.0–100.0)
MONOS PCT: 8 %
MPV: 9.8 fL (ref 7.5–12.5)
Monocytes Absolute: 728 cells/uL (ref 200–950)
NEUTROS PCT: 64 %
Neutro Abs: 5824 cells/uL (ref 1500–7800)
PLATELETS: 257 10*3/uL (ref 140–400)
RBC: 4.39 MIL/uL (ref 3.80–5.10)
RDW: 13.6 % (ref 11.0–15.0)
WBC: 9.1 10*3/uL (ref 3.8–10.8)

## 2016-07-03 LAB — LIPID PANEL
Cholesterol: 142 mg/dL (ref <200)
HDL: 50 mg/dL — AB (ref >50)
LDL CALC: 67 mg/dL (ref <100)
TRIGLYCERIDES: 125 mg/dL (ref <150)
Total CHOL/HDL Ratio: 2.8 Ratio (ref <5.0)
VLDL: 25 mg/dL (ref <30)

## 2016-07-03 LAB — COMPLETE METABOLIC PANEL WITH GFR
ALT: 16 U/L (ref 6–29)
AST: 19 U/L (ref 10–35)
Albumin: 4.2 g/dL (ref 3.6–5.1)
Alkaline Phosphatase: 57 U/L (ref 33–130)
BUN: 16 mg/dL (ref 7–25)
CHLORIDE: 102 mmol/L (ref 98–110)
CO2: 30 mmol/L (ref 20–31)
Calcium: 9.2 mg/dL (ref 8.6–10.4)
Creat: 0.85 mg/dL (ref 0.60–0.88)
GFR, EST AFRICAN AMERICAN: 75 mL/min (ref >=60)
GFR, EST NON AFRICAN AMERICAN: 65 mL/min (ref >=60)
Glucose, Bld: 98 mg/dL (ref 65–99)
Potassium: 3.6 mmol/L (ref 3.5–5.3)
Sodium: 141 mmol/L (ref 135–146)
Total Bilirubin: 1.3 mg/dL — ABNORMAL HIGH (ref 0.2–1.2)
Total Protein: 6.8 g/dL (ref 6.1–8.1)

## 2016-07-07 ENCOUNTER — Ambulatory Visit
Admission: RE | Admit: 2016-07-07 | Discharge: 2016-07-07 | Disposition: A | Discharge status: Home / Self Care | Payer: Medicare PPO | Source: Ambulatory Visit | Attending: Family Medicine | Admitting: Family Medicine

## 2016-07-07 DIAGNOSIS — Z1231 Encounter for screening mammogram for malignant neoplasm of breast: Secondary | ICD-10-CM | POA: Diagnosis not present

## 2016-10-06 ENCOUNTER — Encounter: Payer: Self-pay | Admitting: Obstetrics and Gynecology

## 2016-10-06 ENCOUNTER — Inpatient Hospital Stay: Payer: Medicare PPO | Attending: Obstetrics and Gynecology | Admitting: Obstetrics and Gynecology

## 2016-10-06 VITALS — BP 136/73 | HR 80 | Temp 97.7°F | Resp 18 | Ht 65.0 in | Wt 175.7 lb

## 2016-10-06 DIAGNOSIS — M1990 Unspecified osteoarthritis, unspecified site: Secondary | ICD-10-CM

## 2016-10-06 DIAGNOSIS — C519 Malignant neoplasm of vulva, unspecified: Secondary | ICD-10-CM | POA: Diagnosis not present

## 2016-10-06 DIAGNOSIS — I10 Essential (primary) hypertension: Secondary | ICD-10-CM | POA: Diagnosis not present

## 2016-10-06 DIAGNOSIS — E785 Hyperlipidemia, unspecified: Secondary | ICD-10-CM | POA: Insufficient documentation

## 2016-10-06 DIAGNOSIS — Z79899 Other long term (current) drug therapy: Secondary | ICD-10-CM | POA: Insufficient documentation

## 2016-10-06 DIAGNOSIS — C4499 Other specified malignant neoplasm of skin, unspecified: Secondary | ICD-10-CM

## 2016-10-06 NOTE — Progress Notes (Signed)
  Oncology Nurse Navigator Documentation Chaperoned pelvic exam. Follow up in one year. Navigator Location: [facility] (10/06/16 1000)   )Navigator Encounter Type: Follow-up Appt (10/06/16 1000)                     Patient Visit Type: GynOnc (10/06/16 1000)                              Time Spent with Patient: 15 (10/06/16 1000)

## 2016-10-06 NOTE — Progress Notes (Signed)
Gynecologic Oncology Interval Note  Referring physician: Dr. Ellene Route  Chief Complaint: Paget's disease of the vulva and urethra  Subjective:  Carla Sparks is a 83 y.o. woman who presents today for continued surveillance for history of Paget's disease of the vulva and urethra.   She does not report and itching or irritation in the vulva.  She applies Aquaphor to the urethral area for comfort.  Still has some SUI and wears a pad.  Oncology Treatment History:  The patient is a pleasant patient with a long history of Paget's disease of the vulva. Her history dates back to the 1980s when she recurrent episodes of Paget's disease of the vulva with multiple resections, and in 2009 a vulvectomy with Dr. Clarene Essex.  On 12/05/2012 she underwent vulvar biopsy that demonstrated recurrent Paget's. Pap was negative. On 02/07/2013 she underwent partial vulvectomy of 2 lesions by Dr. Sabra Heck at [facility] with pathology demonstrating Paget's disease.  At a surveillance visit in 2015 she was noted to have a abnormal appearing urethral lesion and she was referred to Dr. Erlene Quan. She underwent cystoscopy, bilateral retrograde pyelograms, bladder biopsy, urethral biopsy, and interpretation of fluoroscopy less than 30 minutes. The urethral biopsy was positive for Paget's disease. Cystoscopy and bladder biopsies were consistent with cystitis. She was referred to Dr. Bjorn Loser for evaluation. He specializes in reconstructive incontinence procedures. He recommended a second opinion with Dr. Tresa Moore which she declined. She opted not to proceed with surgical intervention.  She did try a trial of topical Efudex therapy. She was able to use this treatment for 5 weeks but stated that her skin became very erythematous. She stop using the medication at that time and it took about 2 weeks for the skin to recover.   She saw Dr. Erlene Quan on 06/26/2015 and had a stable exam with negative cystoscopy for follicular cystitis.     Dr Theora Gianotti thought she might feel a mass above the vagina at visit 1/17, but pelvic US negative.   Problem List: Patient Active Problem List   Diagnosis Date Noted  . Knee pain 03/19/2016  . Pre-diabetes 08/12/2015  . Malignant neoplastic disease ([place]) 06/26/2015  . Middle insomnia 06/26/2015  . Dyslipidemia 06/26/2015  . Essential (primary) hypertension 06/26/2015  . Lesion of bladder 06/26/2015  . Arthritis of knee, degenerative 06/26/2015  . Paget's disease of vulva 06/26/2015  . Post menopausal syndrome 06/26/2015  . Encounter for other preprocedural examination 06/26/2015  . Encounter for general adult medical examination without abnormal findings 06/26/2015  . Encounter for screening for other disorder 06/26/2015  . At risk for injury 06/26/2015  . Skin lesion 06/26/2015  . Insomnia 04/29/2015  . Paget disease, extra mammary 12/18/2014  . Primary osteoarthritis of both knees 11/04/2014  . Preoperative cardiovascular examination 03/21/2014  . Essential hypertension, benign 03/21/2014  . Mixed hyperlipidemia 03/21/2014    Past Medical History: Past Medical History:  Diagnosis Date  . Arthritis   . Bleeding from the urethra   . Essential hypertension, benign   . HTN (hypertension)   . Insomnia   . Lesion of urethra   . Mixed hyperlipidemia   . Osteoarthritis   . Paget's disease of vulva     Past Surgical History: Past Surgical History:  Procedure Laterality Date  . CHOLECYSTECTOMY    . External hemorrhoid surgery    . GALLBLADDER SURGERY    . TONSILLECTOMY    . TOTAL ABDOMINAL HYSTERECTOMY W/ BILATERAL SALPINGOOPHORECTOMY    . VULVECTOMY  Family History: Family History  Problem Relation Age of Onset  . Heart disease Father     Diagnosed age 52  . Kidney cancer Father   . Heart failure Mother   . Cancer Brother   . Breast cancer Neg Hx     Social History: Social History   Social History  . Marital status: Married    Spouse name: N/A  .  Number of children: N/A  . Years of education: N/A   Occupational History  . Not on file.   Social History Main Topics  . Smoking status: Never Smoker  . Smokeless tobacco: Never Used  . Alcohol use No  . Drug use: No  . Sexual activity: Not on file   Other Topics Concern  . Not on file   Social History Narrative  . No narrative on file    Allergies: Allergies  Allergen Reactions  . Eggs Or Egg-Derived Products   . Percocet [Oxycodone-Acetaminophen] Rash    And darvocet    Current Medications: Current Outpatient Prescriptions  Medication Sig Dispense Refill  . amLODipine (NORVASC) 5 MG tablet Take 1 tablet (5 mg total) by mouth daily. 90 tablet 3  . aspirin 81 MG tablet Take 81 mg by mouth daily.     . hydrochlorothiazide (HYDRODIURIL) 25 MG tablet Take 1 tablet (25 mg total) by mouth daily. 90 tablet 3  . lisinopril (PRINIVIL,ZESTRIL) 40 MG tablet Take 1 tablet (40 mg total) by mouth daily. 90 tablet 3  . Melatonin 5 MG TABS Take 5 mg by mouth at bedtime as needed.     . metoprolol tartrate (LOPRESSOR) 25 MG tablet Take 1 tablet (25 mg total) by mouth 2 (two) times daily. 180 tablet 3  . sertraline (ZOLOFT) 50 MG tablet Take 1 tablet (50 mg total) by mouth daily. Reported on 08/06/2015 90 tablet 3  . simvastatin (ZOCOR) 40 MG tablet Take 1 tablet (40 mg total) by mouth daily. 90 tablet 3   No current facility-administered medications for this visit.     Review of Systems A comprehensive review of systems was negative.  Objective:  Physical Examination:  BP 136/73   Pulse 80   Temp 97.7 F (36.5 C) (Tympanic)   Resp 18   Ht 5\' 5"  (1.651 m)   Wt 175 lb 11.3 oz (79.7 kg)   BMI 29.24 kg/m   ECOG Performance Status: 0 - Asymptomatic  General appearance: alert, cooperative and appears stated age HEENT: ATNC Lymph node survey: negative inguinal nodes Abdomen: lower abdomen soft, nontender, nondistended without masses or ascites Neurological exam reveals alert,  oriented, normal speech, no focal findings or movement disorder noted  Pelvic: Vulva: The labial structures are not present due to prior surgery. No abnormal areas on the vulva concerning for Paget's.  The areas of erythema surrounding the urethral meatus and clitoris appear stable. The urethral meatus still appears abnormal with increased erythema. Vagina: normal vagina; Adnexa: no masses; Uterus: surgically absent, vaginal cuff well healed; Cervix: absent; BME reveals thickening above the vaginal cuff.  Rectal: confirmatory. Likely represents sigmoid colon   See photo archived on 08/05/2014   Assessment:   Carla Sparks is a 83 y.o. female with Paget's disease of the vulva and urethra since the 1980s s/p resections.  Not symptomatic presently and no active disease in the vulvar area.  There is likely some persistent Paget's disease in the urethral area and has been evaluated and followed by Urology.  Plan:   Problem List Items Addressed This Visit      Musculoskeletal and Integument   Paget disease, extra mammary - Primary     Suggested she return to clinic in1 year, or sooner if symptoms such as itching, irritation or bleeding arise.  We discussed that Karolee Stamps may be an option in the future if she has recurrences of Paget's disease.    Mellody Drown, MD  CC:  Referring physician: Dr. Ellene Route  Dr. Hollice Espy

## 2016-10-08 DIAGNOSIS — G8929 Other chronic pain: Secondary | ICD-10-CM | POA: Diagnosis not present

## 2016-10-08 DIAGNOSIS — M25562 Pain in left knee: Secondary | ICD-10-CM | POA: Diagnosis not present

## 2016-10-08 DIAGNOSIS — M170 Bilateral primary osteoarthritis of knee: Secondary | ICD-10-CM | POA: Diagnosis not present

## 2016-10-08 DIAGNOSIS — M25561 Pain in right knee: Secondary | ICD-10-CM | POA: Diagnosis not present

## 2016-12-24 ENCOUNTER — Encounter: Payer: Self-pay | Admitting: Family Medicine

## 2016-12-24 ENCOUNTER — Other Ambulatory Visit: Payer: Self-pay | Admitting: Family Medicine

## 2016-12-24 ENCOUNTER — Ambulatory Visit (INDEPENDENT_AMBULATORY_CARE_PROVIDER_SITE_OTHER): Payer: Medicare PPO | Admitting: Family Medicine

## 2016-12-24 VITALS — BP 123/64 | HR 67 | Temp 98.4°F | Resp 16 | Ht 65.0 in | Wt 173.0 lb

## 2016-12-24 DIAGNOSIS — F5101 Primary insomnia: Secondary | ICD-10-CM

## 2016-12-24 DIAGNOSIS — R7303 Prediabetes: Secondary | ICD-10-CM

## 2016-12-24 DIAGNOSIS — Z0000 Encounter for general adult medical examination without abnormal findings: Secondary | ICD-10-CM

## 2016-12-24 DIAGNOSIS — E785 Hyperlipidemia, unspecified: Secondary | ICD-10-CM

## 2016-12-24 DIAGNOSIS — I10 Essential (primary) hypertension: Secondary | ICD-10-CM

## 2016-12-24 NOTE — Assessment & Plan Note (Signed)
Well controlled HTN Without known complication No known CAD/MI  Plan: 1. Discussion on current med regimen - for now continue Amlodipine 5mg , HCTZ 25mg , Lisinopril 40mg , Metoprolol 25 BID. Possibly on extensive regimen with reported history of harder to control HTN. Now has been better controlled, open to consider dose reduction. Gave her 2 options, either half Metoprolol to 12.5mg  BID then trial off, or stop Amlodipine 2. Monitor BP closely especially if making change, gave her BP log 3. Encourage improve regular exercise (limited by OA/DJD knees), dec sodium and caffeine 4. Follow-up 6 months Annual Physical + labs

## 2016-12-24 NOTE — Assessment & Plan Note (Signed)
Stable chronic insomnia, unclear if underlying etiology - No history of Depression or Anxiety  Plan: 1. Continue Zoloft 50mg  daily for now - discussed benefits of this med and its use for sleep as well as depression/anxiety as primary reason. Consider option to decrease med, try every other day for 2 weeks then stop, see if unchanged. Consider future trial on other med more antidepressants, such as Trazodone 50mg  nightly, discussed Ambien but do not prefer to use this option, patient agrees due to risks 2. Sleep hygiene handout given 3. Follow-up as needed

## 2016-12-24 NOTE — Assessment & Plan Note (Signed)
Prior A1c 5.9, pre-diabetes Diet controlled, not on any medications Follow-up 6 months annual physical + A1c

## 2016-12-24 NOTE — Progress Notes (Signed)
Subjective:    Patient ID: Carla Sparks, female    DOB: [birth year], 83 y.o.   MRN: [id number]  Carla Sparks is a 83 y.o. female presenting on 12/24/2016 for Hypertension   HPI   CHRONIC HTN: Reports no concerns. Checks BP at home, has readings 110-120s/60-70s. Normal heartrate. Current Meds - Amlodipine 63m, HCTZ 248m Lisinopril 4073mM, Metoprolol 2m34mD Reports good compliance, took meds today. Tolerating well, w/o complaints. Lifestyle: - Diet: no changes - Exercise: does some regular light weight lifting exercises for upper body, and has some limited with knee arthritis goes to Ortho for steroid injections - Denies any known history of CAD, MI, CVA, unsure exactly why she is on beta blocker Denies CP, dyspnea, HA, edema, dizziness / lightheadedness  HYPERLIPIDEMIA: - Reports no concerns. Last lipid panel 06/2016, controlled - Currently taking Simvastatin 40mg15mlerating well without side effects or myalgias - Taking ASA 81mg 24momnia, Chronic - Reports chronic problem with insomnia for most of life - Does not follow a lot of sleep hygiene recommendations, watches TV at night feels tired but then difficulty falling asleep at night and sometimes waking up overnight, no other provoked symptoms for awakening (not pain, nocturia). Does admit to some difficulty "shutting off mind" when thinking about things she has to do at night - No coffee, but drinks unsweet tea in afternoon, and some pepsi 0 - Taking Zoloft 50mg n76mly, unsure exactly why until discussed today. She does not have depression or anxiety. Never been on other sleep aid medications.  Mild Memory Loss - Reports stable chronic problem she was curious to learn more about. States she has some minor difficulties with memory that is more frustrating but does not cause significant problem. Example she goes into room and forgets what she was looking for, will think of it outside room. Or can "see person's face"  or think of someone but can't recall their name until later. States she talks to other friends her age with similar problems.  - She is asking about taking a dietary supplement, Prevagen - Family history with Maternal uncle and aunt with Alzheimer's Dementia  PMH - PreDiabetes  Social History  Substance Use Topics  . Smoking status: Never Smoker  . Smokeless tobacco: Never Used  . Alcohol use No   Depression screen PHQ 2/9[facility]/28/2017 04/19/2016 02/12/2016  Decreased Interest 0 0 0  Down, Depressed, Hopeless 0 0 0  PHQ - 2 Score 0 0 0    Review of Systems Per HPI unless specifically indicated above     Objective:    BP 123/64   Pulse 67   Temp 98.4 F (36.9 C) (Oral)   Resp 16   Ht _0  (1.651 m)   Wt 173 lb (78.5 kg)   BMI 28.79 kg/m   Wt Readings from Last 3 Encounters:  12/24/16 173 lb (78.5 kg)  10/06/16 175 lb 11.3 oz (79.7 kg)  06/22/16 178 lb (80.7 kg)    Physical Exam  Constitutional: She is oriented to person, place, and time. She appears well-developed and well-nourished. No distress.  Well-appearing 83 year42ld female, comfortable, cooperative  HENT:  Head: Normocephalic and atraumatic.  Mouth/Throat: Oropharynx is clear and moist.  Eyes: Conjunctivae are normal. Right eye exhibits no discharge. Left eye exhibits no discharge.  Neck: Normal range of motion. Neck supple. No thyromegaly present.  Cardiovascular: Normal rate, regular rhythm, normal heart sounds and intact distal pulses.   No murmur heard. Pulmonary/Chest:  Effort normal and breath sounds normal. No respiratory distress. She has no wheezes. She has no rales.  Musculoskeletal: Normal range of motion. She exhibits no edema.  Lymphadenopathy:    She has no cervical adenopathy.  Neurological: She is alert and oriented to person, place, and time.  Skin: Skin is warm and dry. No rash noted. She is not diaphoretic. No erythema.  Psychiatric: She has a normal mood and affect. Her behavior is normal.    Well groomed, good eye contact, normal speech and thoughts  Nursing note and vitals reviewed.  Results for orders placed or performed in visit on 06/22/16  COMPLETE METABOLIC PANEL WITH GFR  Result Value Ref Range   Sodium 141 135 - 146 mmol/L   Potassium 3.6 3.5 - 5.3 mmol/L   Chloride 102 98 - 110 mmol/L   CO2 30 20 - 31 mmol/L   Glucose, Bld 98 65 - 99 mg/dL   BUN 16 7 - 25 mg/dL   Creat 0.85 0.60 - 0.88 mg/dL   Total Bilirubin 1.3 (H) 0.2 - 1.2 mg/dL   Alkaline Phosphatase 57 33 - 130 U/L   AST 19 10 - 35 U/L   ALT 16 6 - 29 U/L   Total Protein 6.8 6.1 - 8.1 g/dL   Albumin 4.2 3.6 - 5.1 g/dL   Calcium 9.2 8.6 - 10.4 mg/dL   GFR, Est African American 75 >=60 mL/min   GFR, Est Non African American 65 >=60 mL/min  Lipid Profile  Result Value Ref Range   Cholesterol 142 <200 mg/dL   Triglycerides 125 <150 mg/dL   HDL 50 (L) >50 mg/dL   Total CHOL/HDL Ratio 2.8 <5.0 Ratio   VLDL 25 <30 mg/dL   LDL Cholesterol 67 <100 mg/dL  CBC with Differential  Result Value Ref Range   WBC 9.1 3.8 - 10.8 K/uL   RBC 4.39 3.80 - 5.10 MIL/uL   Hemoglobin 13.5 11.7 - 15.5 g/dL   HCT 40.2 35.0 - 45.0 %   MCV 91.6 80.0 - 100.0 fL   MCH 30.8 27.0 - 33.0 pg   MCHC 33.6 32.0 - 36.0 g/dL   RDW 13.6 11.0 - 15.0 %   Platelets 257 140 - 400 K/uL   MPV 9.8 7.5 - 12.5 fL   Neutro Abs 5,824 1,500 - 7,800 cells/uL   Lymphs Abs 2,366 850 - 3,900 cells/uL   Monocytes Absolute 728 200 - 950 cells/uL   Eosinophils Absolute 182 15 - 500 cells/uL   Basophils Absolute 0 0 - 200 cells/uL   Neutrophils Relative % 64 %   Lymphocytes Relative 26 %   Monocytes Relative 8 %   Eosinophils Relative 2 %   Basophils Relative 0 %   Smear Review Criteria for review not met       Assessment & Plan:   Problem List Items Addressed This Visit    Pre-diabetes    Prior A1c 5.9, pre-diabetes Diet controlled, not on any medications Follow-up 6 months annual physical + A1c      Insomnia    Stable chronic  insomnia, unclear if underlying etiology - No history of Depression or Anxiety  Plan: 1. Continue Zoloft '50mg'$  daily for now - discussed benefits of this med and its use for sleep as well as depression/anxiety as primary reason. Consider option to decrease med, try every other day for 2 weeks then stop, see if unchanged. Consider future trial on other med more antidepressants, such as Trazodone '50mg'$  nightly, discussed Ambien  but do not prefer to use this option, patient agrees due to risks 2. Sleep hygiene handout given 3. Follow-up as needed      Essential hypertension, benign - Primary    Well controlled HTN Without known complication No known CAD/MI  Plan: 1. Discussion on current med regimen - for now continue Amlodipine 72m, HCTZ 258m Lisinopril 4039mMetoprolol 25 BID. Possibly on extensive regimen with reported history of harder to control HTN. Now has been better controlled, open to consider dose reduction. Gave her 2 options, either half Metoprolol to 12.5mg54mD then trial off, or stop Amlodipine 2. Monitor BP closely especially if making change, gave her BP log 3. Encourage improve regular exercise (limited by OA/DJD knees), dec sodium and caffeine 4. Follow-up 6 months Annual Physical + labs      Dyslipidemia    Controlled on Statin Last lipids 06/2016 Continue Simvastatin 40mg78mlerating well Continue ASA 81 mg daily Follow-up 6 months annual physical + fasting labs lipids        Mild Memory Loss - Not clinically significant, consistent with normal aging memory decline. History of alzheimer's in some family member, less likely to be direct correlation to her. Advised of symptoms to look out for and if progression - May try OTC dietary supplement Prevagen if interested, do not have any concern on risk of this one, but advised herbal / supplements not studied to extent of medications - Follow-up as needed   No orders of the defined types were placed in this  encounter.    Follow up plan: Return in about 6 months (around 06/25/2017) for Annual Physical.  AlexaNobie PutnamSouthCashionp 12/24/2016, 2:15 PM

## 2016-12-24 NOTE — Patient Instructions (Addendum)
Thank you for coming to the clinic today.  1.  For BP - well controlled, but im concerned may be too many meds  Option 1: try cutting Metoprolol in half for 12.5mg  twice daily - eventually maybe can even STOP this one completely  Option 2: try stop taking Amlodipine 5mg  to see how you do  Monitor BP regularly, if making change check BP every day or other day  Let me know --------------------------------------------  Try to do own experiment with Sertraline (Zoloft) may take one pill every OTHER night for 2 weeks, then STOP completely, see if you feel any different (mood, anxiety, general feeling, and sleep)  Otherwise we can try a new medication, anti-depressant category still, but this is better for sleep - Trazodone 50mg  nightly (not as needed)  Sleep Hygiene Recommendations to promote healthy sleep in all patients, especially if symptoms of insomnia are worsening. Due to the nature of sleep rhythms, if your body gets "out of rhythm", it may take some time before your sleep cycle can be "reset".  Please try to follow as many of the following tips as you can, usually there are only a few of these are the primary cause of the problem.  ?To reset your sleep rhythm, go to bed and get up at the same time every day ?Sleep only long enough to feel rested and then get out of bed ?Do not try to force yourself to sleep. If you can't sleep, get out of bed and try again later.  ?Have coffee, tea, and other foods that have caffeine only in the morning ?Exercise several days a week, but not right before bed ?If you drink alcohol, prefer to have appropriate drink with one meal, but prefer to avoid alcohol in the evening, and bedtime ?If you smoke, avoid smoking, especially in the evening  ?Avoid watching TV or looking at phones, computers, or reading devices ("e-books") that give off light at least 30 minutes before bed. This artificial light sends "awake signals" to your brain and can make it  harder to fall asleep. ?Keep your bedroom dark, cool, quiet, and free of reminders of other things that cause you stress ?Try your best to solve or at least address your problems before you go to bed  -----------------------------------------------------------------  If rx near run out then call us in advance to change name to me instead of Dr Johnell Comings will be due for FASTING BLOOD WORK (no food or drink after midnight before, only water or coffee without cream/sugar on the morning of)  - Please go ahead and schedule a "Lab Only" visit in the morning at the clinic for lab draw in 6 months   Please schedule a Follow-up Appointment to: Return in about 6 months (around 06/25/2017) for Annual Physical.  If you have any other questions or concerns, please feel free to call the clinic or send a message through [place]. You may also schedule an earlier appointment if necessary.  Nobie Putnam, DO [place]

## 2016-12-24 NOTE — Assessment & Plan Note (Signed)
Controlled on Statin Last lipids 06/2016 Continue Simvastatin 40mg , tolerating well Continue ASA 81 mg daily Follow-up 6 months annual physical + fasting labs lipids

## 2017-01-17 DIAGNOSIS — M25562 Pain in left knee: Secondary | ICD-10-CM | POA: Diagnosis not present

## 2017-01-17 DIAGNOSIS — M25561 Pain in right knee: Secondary | ICD-10-CM | POA: Diagnosis not present

## 2017-01-17 DIAGNOSIS — G8929 Other chronic pain: Secondary | ICD-10-CM | POA: Diagnosis not present

## 2017-01-17 DIAGNOSIS — M170 Bilateral primary osteoarthritis of knee: Secondary | ICD-10-CM | POA: Diagnosis not present

## 2017-02-08 ENCOUNTER — Telehealth: Payer: Self-pay | Admitting: Family Medicine

## 2017-02-08 NOTE — Telephone Encounter (Signed)
Called pt to schedule Annual Wellness Visit with NHA  - knb 11/27?

## 2017-05-13 DIAGNOSIS — M25561 Pain in right knee: Secondary | ICD-10-CM | POA: Diagnosis not present

## 2017-05-13 DIAGNOSIS — M25562 Pain in left knee: Secondary | ICD-10-CM | POA: Diagnosis not present

## 2017-05-13 DIAGNOSIS — G8929 Other chronic pain: Secondary | ICD-10-CM | POA: Diagnosis not present

## 2017-05-13 DIAGNOSIS — M170 Bilateral primary osteoarthritis of knee: Secondary | ICD-10-CM | POA: Diagnosis not present

## 2017-06-04 DIAGNOSIS — L2389 Allergic contact dermatitis due to other agents: Secondary | ICD-10-CM | POA: Diagnosis not present

## 2017-06-20 ENCOUNTER — Other Ambulatory Visit: Payer: Self-pay | Admitting: Obstetrics and Gynecology

## 2017-06-20 DIAGNOSIS — Z1231 Encounter for screening mammogram for malignant neoplasm of breast: Secondary | ICD-10-CM

## 2017-06-21 ENCOUNTER — Other Ambulatory Visit: Payer: Medicare PPO

## 2017-06-21 ENCOUNTER — Ambulatory Visit (INDEPENDENT_AMBULATORY_CARE_PROVIDER_SITE_OTHER): Payer: Medicare PPO

## 2017-06-21 VITALS — BP 142/78 | HR 62 | Temp 97.9°F | Ht 65.0 in | Wt 162.2 lb

## 2017-06-21 DIAGNOSIS — E785 Hyperlipidemia, unspecified: Secondary | ICD-10-CM | POA: Diagnosis not present

## 2017-06-21 DIAGNOSIS — R7303 Prediabetes: Secondary | ICD-10-CM | POA: Diagnosis not present

## 2017-06-21 DIAGNOSIS — Z0000 Encounter for general adult medical examination without abnormal findings: Secondary | ICD-10-CM

## 2017-06-21 DIAGNOSIS — I10 Essential (primary) hypertension: Secondary | ICD-10-CM | POA: Diagnosis not present

## 2017-06-21 NOTE — Progress Notes (Signed)
Subjective:   Carla Sparks is a 83 y.o. female who presents for Medicare Annual (Subsequent) preventive examination.  Review of Systems:   Cardiac Risk Factors include: advanced age (>66men, >58 women);hypertension;dyslipidemia     Objective:     Vitals: BP (!) 142/78 (BP Location: Right Arm, Patient Position: Sitting)   Pulse 62   Temp 97.9 F (36.6 C) (Oral)   Ht 5\' 5"  (1.651 m)   Wt 162 lb 3.2 oz (73.6 kg)   BMI 26.99 kg/m   Body mass index is 26.99 kg/m.   Tobacco Social History   Tobacco Use  Smoking Status Never Smoker  Smokeless Tobacco Never Used     Counseling given: No   Past Medical History:  Diagnosis Date  . Arthritis   . Bleeding from the urethra   . Essential hypertension, benign   . HTN (hypertension)   . Insomnia   . Lesion of urethra   . Mixed hyperlipidemia   . Osteoarthritis   . Paget's disease of vulva    Past Surgical History:  Procedure Laterality Date  . CHOLECYSTECTOMY    . External hemorrhoid surgery    . GALLBLADDER SURGERY    . TONSILLECTOMY    . TOTAL ABDOMINAL HYSTERECTOMY W/ BILATERAL SALPINGOOPHORECTOMY    . VULVECTOMY     Family History  Problem Relation Age of Onset  . Heart disease Father        Diagnosed age 21  . Kidney cancer Father   . Heart failure Mother   . Cancer Brother   . Breast cancer Neg Hx    Social History   Substance and Sexual Activity  Sexual Activity Not on file    Outpatient Encounter Medications as of 06/21/2017  Medication Sig  . amLODipine (NORVASC) 5 MG tablet Take 1 tablet (5 mg total) by mouth daily.  Marland Kitchen aspirin 81 MG tablet Take 81 mg by mouth daily.   . hydrochlorothiazide (HYDRODIURIL) 25 MG tablet Take 1 tablet (25 mg total) by mouth daily.  Marland Kitchen lisinopril (PRINIVIL,ZESTRIL) 40 MG tablet Take 1 tablet (40 mg total) by mouth daily.  . simvastatin (ZOCOR) 40 MG tablet Take 1 tablet (40 mg total) by mouth daily.  . [DISCONTINUED] Melatonin 5 MG TABS Take 5 mg by mouth at  bedtime as needed.   . [DISCONTINUED] metoprolol tartrate (LOPRESSOR) 25 MG tablet Take 1 tablet (25 mg total) by mouth 2 (two) times daily. (Patient not taking: Reported on 06/21/2017)  . [DISCONTINUED] sertraline (ZOLOFT) 50 MG tablet Take 1 tablet (50 mg total) by mouth daily. Reported on 08/06/2015 (Patient not taking: Reported on 06/21/2017)   No facility-administered encounter medications on file as of 06/21/2017.     Activities of Daily Living In your present state of health, do you have any difficulty performing the following activities: 06/21/2017  Hearing? N  Vision? N  Difficulty concentrating or making decisions? N  Walking or climbing stairs? N  Dressing or bathing? N  Doing errands, shopping? N  Preparing Food and eating ? N  Using the Toilet? N  In the past six months, have you accidently leaked urine? N  Do you have problems with loss of bowel control? N  Managing your Medications? N  Managing your Finances? N  Housekeeping or managing your Housekeeping? N  Some recent data might be hidden    Patient Care Team: Olin Hauser, DO as PCP - General (Family Medicine) Poggi, Marshall Cork, MD as Consulting Physician (Surgery) Mellody Drown, MD  as Referring Physician (Obstetrics and Gynecology)    Assessment:     Exercise Activities and Dietary recommendations Current Exercise Habits: Home exercise routine, Type of exercise: strength training/weights, Time (Minutes): 15, Frequency (Times/Week): 5, Weekly Exercise (Minutes/Week): 75, Intensity: Mild, Exercise limited by: None identified  Goals    . DIET - INCREASE WATER INTAKE     Recommend drinking at least 6-8 glasses of water a day       Fall Risk Fall Risk  06/21/2017 06/22/2016 04/19/2016 02/12/2016 08/12/2015  Falls in the past year? No No Yes Yes No  Number falls in past yr: - - 1 1 -  Injury with Fall? - - Yes Yes -  Comment - - scrapped knees slid out of bed getting tissue. -   Depression  Screen PHQ 2/9 Scores 06/21/2017 06/22/2016 04/19/2016 02/12/2016  PHQ - 2 Score 0 0 0 0     Cognitive Function     6CIT Screen 06/21/2017  What Year? 0 points  What month? 0 points  What time? 0 points  Count back from 20 0 points  Months in reverse 0 points  Repeat phrase 0 points  Total Score 0    Immunization History  Administered Date(s) Administered  . Pneumococcal Polysaccharide-23 04/26/2003  . Td 09/29/2009  . Tdap 04/25/2012   Screening Tests Health Maintenance  Topic Date Due  . INFLUENZA VACCINE  10/23/2018 (Originally 02/23/2017)  . PNA vac Low Risk Adult (2 of 2 - PCV13) 04/26/2019 (Originally 04/25/2004)  . TETANUS/TDAP  04/25/2022  . DEXA SCAN  Completed      Plan:      I have personally reviewed and addressed the Medicare Annual Wellness questionnaire and have noted the following in the patient's chart:  A. Medical and social history B. Use of alcohol, tobacco or illicit drugs  C. Current medications and supplements D. Functional ability and status E.  Nutritional status F.  Physical activity G. Advance directives H. List of other physicians I.  Hospitalizations, surgeries, and ER visits in previous 12 months J.  [place] such as hearing and vision if needed, cognitive and depression L. Referrals and appointments   In addition, I have reviewed and discussed with patient certain preventive protocols, quality metrics, and best practice recommendations. A written personalized care plan for preventive services as well as general preventive health recommendations were provided to patient.   Signed,  Tyler Aas, LPN Nurse Health Advisor   Nurse Notes:none

## 2017-06-21 NOTE — Patient Instructions (Addendum)
Carla Sparks , Thank you for taking time to come for your Medicare Wellness Visit. I appreciate your ongoing commitment to your health goals. Please review the following plan we discussed and let me know if I can assist you in the future.   Screening recommendations/referrals: Colonoscopy: no longer required Mammogram: completed 07/08/2016, no longer required Bone Density: completed 01/01/2015, no longer required Recommended yearly ophthalmology/optometry visit for glaucoma screening and checkup Recommended yearly dental visit for hygiene and checkup  Vaccinations: Influenza vaccine: declined due to allergies Pneumococcal vaccine: up to date  Tdap vaccine: up to date Shingles vaccine: due, check with your insurance company for coverage   Advanced directives: Advance directive discussed with you today. Even though you declined this today please call our office should you change your mind and we can give you the proper paperwork for you to fill out.  Conditions/risks identified: Recommend drinking at least 6-8 glasses of water a day   Next appointment: Follow up on 06/29/2017 at 10:00 am with Dr.Karamalegos. Follow up in one year for your annual wellness exam.    Preventive Care 65 Years and Older, Female Preventive care refers to lifestyle choices and visits with your health care provider that can promote health and wellness. What does preventive care include?  A yearly physical exam. This is also called an annual well check.  Dental exams once or twice a year.  Routine eye exams. Ask your health care provider how often you should have your eyes checked.  Personal lifestyle choices, including:  Daily care of your teeth and gums.  Regular physical activity.  Eating a healthy diet.  Avoiding tobacco and drug use.  Limiting alcohol use.  Practicing safe sex.  Taking low-dose aspirin every day.  Taking vitamin and mineral supplements as recommended by your health care  provider. What happens during an annual well check? The services and screenings done by your health care provider during your annual well check will depend on your age, overall health, lifestyle risk factors, and family history of disease. Counseling  Your health care provider may ask you questions about your:  Alcohol use.  Tobacco use.  Drug use.  Emotional well-being.  Home and relationship well-being.  Sexual activity.  Eating habits.  History of falls.  Memory and ability to understand (cognition).  Work and work Statistician.  Reproductive health. Screening  You may have the following tests or measurements:  Height, weight, and BMI.  Blood pressure.  Lipid and cholesterol levels. These may be checked every 5 years, or more frequently if you are over 61 years old.  Skin check.  Lung cancer screening. You may have this screening every year starting at age 60 if you have a 30-pack-year history of smoking and currently smoke or have quit within the past 15 years.  Fecal occult blood test (FOBT) of the stool. You may have this test every year starting at age 79.  Flexible sigmoidoscopy or colonoscopy. You may have a sigmoidoscopy every 5 years or a colonoscopy every 10 years starting at age 65.  Hepatitis C blood test.  Hepatitis B blood test.  Sexually transmitted disease (STD) testing.  Diabetes screening. This is done by checking your blood sugar (glucose) after you have not eaten for a while (fasting). You may have this done every 1-3 years.  Bone density scan. This is done to screen for osteoporosis. You may have this done starting at age 18.  Mammogram. This may be done every 1-2 years. Talk to your  health care provider about how often you should have regular mammograms. Talk with your health care provider about your test results, treatment options, and if necessary, the need for more tests. Vaccines  Your health care provider may recommend certain  vaccines, such as:  Influenza vaccine. This is recommended every year.  Tetanus, diphtheria, and acellular pertussis (Tdap, Td) vaccine. You may need a Td booster every 10 years.  Zoster vaccine. You may need this after age 64.  Pneumococcal 13-valent conjugate (PCV13) vaccine. One dose is recommended after age 9.  Pneumococcal polysaccharide (PPSV23) vaccine. One dose is recommended after age 60. Talk to your health care provider about which screenings and vaccines you need and how often you need them. This information is not intended to replace advice given to you by your health care provider. Make sure you discuss any questions you have with your health care provider. Document Released: 08/08/2015 Document Revised: 03/31/2016 Document Reviewed: 05/13/2015 Elsevier Interactive Patient Education  2017 [place] Prevention in the Home Falls can cause injuries. They can happen to people of all ages. There are many things you can do to make your home safe and to help prevent falls. What can I do on the outside of my home?  Regularly fix the edges of walkways and driveways and fix any cracks.  Remove anything that might make you trip as you walk through a door, such as a raised step or threshold.  Trim any bushes or trees on the path to your home.  Use bright outdoor lighting.  Clear any walking paths of anything that might make someone trip, such as rocks or tools.  Regularly check to see if handrails are loose or broken. Make sure that both sides of any steps have handrails.  Any raised decks and porches should have guardrails on the edges.  Have any leaves, snow, or ice cleared regularly.  Use sand or salt on walking paths during winter.  Clean up any spills in your garage right away. This includes oil or grease spills. What can I do in the bathroom?  Use night lights.  Install grab bars by the toilet and in the tub and shower. Do not use towel bars as grab  bars.  Use non-skid mats or decals in the tub or shower.  If you need to sit down in the shower, use a plastic, non-slip stool.  Keep the floor dry. Clean up any water that spills on the floor as soon as it happens.  Remove soap buildup in the tub or shower regularly.  Attach bath mats securely with double-sided non-slip rug tape.  Do not have throw rugs and other things on the floor that can make you trip. What can I do in the bedroom?  Use night lights.  Make sure that you have a light by your bed that is easy to reach.  Do not use any sheets or blankets that are too big for your bed. They should not hang down onto the floor.  Have a firm chair that has side arms. You can use this for support while you get dressed.  Do not have throw rugs and other things on the floor that can make you trip. What can I do in the kitchen?  Clean up any spills right away.  Avoid walking on wet floors.  Keep items that you use a lot in easy-to-reach places.  If you need to reach something above you, use a strong step stool that has a  grab bar.  Keep electrical cords out of the way.  Do not use floor polish or wax that makes floors slippery. If you must use wax, use non-skid floor wax.  Do not have throw rugs and other things on the floor that can make you trip. What can I do with my stairs?  Do not leave any items on the stairs.  Make sure that there are handrails on both sides of the stairs and use them. Fix handrails that are broken or loose. Make sure that handrails are as long as the stairways.  Check any carpeting to make sure that it is firmly attached to the stairs. Fix any carpet that is loose or worn.  Avoid having throw rugs at the top or bottom of the stairs. If you do have throw rugs, attach them to the floor with carpet tape.  Make sure that you have a light switch at the top of the stairs and the bottom of the stairs. If you do not have them, ask someone to add them for  you. What else can I do to help prevent falls?  Wear shoes that:  Do not have high heels.  Have rubber bottoms.  Are comfortable and fit you well.  Are closed at the toe. Do not wear sandals.  If you use a stepladder:  Make sure that it is fully opened. Do not climb a closed stepladder.  Make sure that both sides of the stepladder are locked into place.  Ask someone to hold it for you, if possible.  Clearly mark and make sure that you can see:  Any grab bars or handrails.  First and last steps.  Where the edge of each step is.  Use tools that help you move around (mobility aids) if they are needed. These include:  Canes.  Walkers.  Scooters.  Crutches.  Turn on the lights when you go into a dark area. Replace any light bulbs as soon as they burn out.  Set up your furniture so you have a clear path. Avoid moving your furniture around.  If any of your floors are uneven, fix them.  If there are any pets around you, be aware of where they are.  Review your medicines with your doctor. Some medicines can make you feel dizzy. This can increase your chance of falling. Ask your doctor what other things that you can do to help prevent falls. This information is not intended to replace advice given to you by your health care provider. Make sure you discuss any questions you have with your health care provider. Document Released: 05/08/2009 Document Revised: 12/18/2015 Document Reviewed: 08/16/2014 Elsevier Interactive Patient Education  2017 [employer].

## 2017-06-22 LAB — LIPID PANEL
CHOL/HDL RATIO: 2.8 (calc) (ref <5.0)
Cholesterol: 164 mg/dL (ref <200)
HDL: 59 mg/dL (ref >50)
LDL Cholesterol (Calc): 84 mg/dL (calc)
Non-HDL Cholesterol (Calc): 105 mg/dL (calc) (ref <130)
TRIGLYCERIDES: 111 mg/dL (ref <150)

## 2017-06-22 LAB — CBC WITH DIFFERENTIAL/PLATELET
BASOS ABS: 58 {cells}/uL (ref 0–200)
BASOS PCT: 0.7 %
EOS PCT: 0.7 %
Eosinophils Absolute: 58 cells/uL (ref 15–500)
HCT: 42.2 % (ref 35.0–45.0)
HEMOGLOBIN: 14.4 g/dL (ref 11.7–15.5)
LYMPHS ABS: 2673 {cells}/uL (ref 850–3900)
MCH: 30.8 pg (ref 27.0–33.0)
MCHC: 34.1 g/dL (ref 32.0–36.0)
MCV: 90.2 fL (ref 80.0–100.0)
MPV: 9.7 fL (ref 7.5–12.5)
Monocytes Relative: 9.8 %
NEUTROS ABS: 4698 {cells}/uL (ref 1500–7800)
Neutrophils Relative %: 56.6 %
Platelets: 257 10*3/uL (ref 140–400)
RBC: 4.68 10*6/uL (ref 3.80–5.10)
RDW: 12.7 % (ref 11.0–15.0)
Total Lymphocyte: 32.2 %
WBC mixed population: 813 cells/uL (ref 200–950)
WBC: 8.3 10*3/uL (ref 3.8–10.8)

## 2017-06-22 LAB — COMPLETE METABOLIC PANEL WITH GFR
AG RATIO: 1.7 (calc) (ref 1.0–2.5)
ALT: 15 U/L (ref 6–29)
AST: 15 U/L (ref 10–35)
Albumin: 4.3 g/dL (ref 3.6–5.1)
Alkaline phosphatase (APISO): 52 U/L (ref 33–130)
BILIRUBIN TOTAL: 1 mg/dL (ref 0.2–1.2)
BUN: 15 mg/dL (ref 7–25)
CALCIUM: 9.6 mg/dL (ref 8.6–10.4)
CHLORIDE: 102 mmol/L (ref 98–110)
CO2: 30 mmol/L (ref 20–32)
Creat: 0.82 mg/dL (ref 0.60–0.88)
GFR, EST AFRICAN AMERICAN: 78 mL/min/{1.73_m2} (ref >=60)
GFR, Est Non African American: 67 mL/min/{1.73_m2} (ref >=60)
GLOBULIN: 2.5 g/dL (ref 1.9–3.7)
Glucose, Bld: 83 mg/dL (ref 65–99)
POTASSIUM: 3.9 mmol/L (ref 3.5–5.3)
SODIUM: 140 mmol/L (ref 135–146)
TOTAL PROTEIN: 6.8 g/dL (ref 6.1–8.1)

## 2017-06-22 LAB — HEMOGLOBIN A1C
HEMOGLOBIN A1C: 5.7 %{Hb} — AB (ref <5.7)
Mean Plasma Glucose: 117 (calc)
eAG (mmol/L): 6.5 (calc)

## 2017-06-29 ENCOUNTER — Ambulatory Visit (INDEPENDENT_AMBULATORY_CARE_PROVIDER_SITE_OTHER): Payer: Medicare PPO | Admitting: Family Medicine

## 2017-06-29 ENCOUNTER — Encounter: Payer: Self-pay | Admitting: Family Medicine

## 2017-06-29 VITALS — BP 130/68 | HR 89 | Temp 98.3°F | Resp 16 | Ht 65.0 in | Wt 164.0 lb

## 2017-06-29 DIAGNOSIS — E782 Mixed hyperlipidemia: Secondary | ICD-10-CM

## 2017-06-29 DIAGNOSIS — F5101 Primary insomnia: Secondary | ICD-10-CM | POA: Diagnosis not present

## 2017-06-29 DIAGNOSIS — I10 Essential (primary) hypertension: Secondary | ICD-10-CM | POA: Diagnosis not present

## 2017-06-29 DIAGNOSIS — Z0000 Encounter for general adult medical examination without abnormal findings: Secondary | ICD-10-CM

## 2017-06-29 DIAGNOSIS — E785 Hyperlipidemia, unspecified: Secondary | ICD-10-CM

## 2017-06-29 DIAGNOSIS — R7303 Prediabetes: Secondary | ICD-10-CM | POA: Diagnosis not present

## 2017-06-29 DIAGNOSIS — C4499 Other specified malignant neoplasm of skin, unspecified: Secondary | ICD-10-CM

## 2017-06-29 DIAGNOSIS — C519 Malignant neoplasm of vulva, unspecified: Secondary | ICD-10-CM

## 2017-06-29 MED ORDER — LISINOPRIL 40 MG PO TABS: 40.0000 mg | ORAL_TABLET | Freq: Every day | ORAL | 3 refills | Status: DC

## 2017-06-29 MED ORDER — SIMVASTATIN 40 MG PO TABS: 40.0000 mg | ORAL_TABLET | Freq: Every day | ORAL | 3 refills | Status: DC

## 2017-06-29 MED ORDER — TRAZODONE HCL 50 MG PO TABS: 50.0000 mg | ORAL_TABLET | Freq: Every evening | ORAL | 3 refills | Status: DC | PRN

## 2017-06-29 MED ORDER — AMLODIPINE BESYLATE 5 MG PO TABS: 5.0000 mg | ORAL_TABLET | Freq: Every day | ORAL | 3 refills | Status: DC

## 2017-06-29 MED ORDER — HYDROCHLOROTHIAZIDE 25 MG PO TABS
25.0000 mg | ORAL_TABLET | Freq: Every day | ORAL | 3 refills | Status: DC
Start: 2017-06-29 — End: 2018-08-23

## 2017-06-29 NOTE — Progress Notes (Signed)
Subjective:    Patient ID: Carla Sparks, female    DOB: 19-Jan-1934, 83 y.o.   MRN: [id number]  Carla Sparks is a 83 y.o. female presenting on 06/29/2017 for Annual Exam   HPI   Here for Annual Exam and Lab Review  CHRONIC HTN: Reports doing well, checking at home once weekly, readings still normal since last visit discontinued Metoprolol, has BP normal 110-120 already given [place] copy to be scanned into chart, has new Omron BP cuff at home Current Meds - Amlodipine 5mg , HCTZ 25mg , Lisinopril 40mg  AM Reports good compliance, took meds today. Tolerating well, w/o complaints. Lifestyle: - Diet: no changes - Exercise: does some regular light weight lifting exercises still, some walking, limited by knees  HYPERLIPIDEMIA: - Reports no concerns. Last lipid panel 05/2017, controlled - Currently taking Simvastatin 40mg , tolerating well without side effects or myalgias - Taking ASA 81mg   Insomnia, Chronic - Last visit with me 12/24/16, for initial visit for same problem, treated with trial off Zoloft and reviewed Sleep Hygiene, see prior notes for background information with chronic insomnia for most of life - Today patient reports still has same problem with insomnia without improvement, she considered Trazodone and would like to try this, requesting new rx - Describes sleep onset insomnia often, some difficulty "shutting off mind" when thinking about things she has to do at night - No coffee, but still drinks unsweet tea in afternoon, and some pepsi 0 - Denies history of depression or anxiety  Pre-Diabetes Reports no concerns, prior A1c 5.9, now lab review shows 5.7 improvement CBGs: None Meds: None Currently on ACEi Denies hypoglycemia  PMH - 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 Arthritis   . Bleeding from the urethra   . Essential hypertension, benign   . HTN (hypertension)   . Insomnia   . Lesion of urethra   . Mixed hyperlipidemia   . Osteoarthritis   . Paget's disease of vulva    Past Surgical History:  Procedure Laterality Date  . CHOLECYSTECTOMY    . External hemorrhoid surgery    . GALLBLADDER SURGERY    . TONSILLECTOMY    . TOTAL ABDOMINAL HYSTERECTOMY W/ BILATERAL SALPINGOOPHORECTOMY    . VULVECTOMY     Social History   Socioeconomic History  . Marital status: Married    Spouse name: Not on file  . Number of children: Not on file  . Years of education: 33  . Highest education level: 12th grade  Social Needs  . Financial resource strain: Not hard at all  . Food insecurity - worry: Never true  . Food insecurity - inability: Never true  . Transportation needs - medical: No  . Transportation needs - non-medical: No  Occupational History  . Not on file  Tobacco Use  . Smoking status: Never Smoker  . Smokeless tobacco: Never Used  Substance and Sexual Activity  . Alcohol use: No  . Drug use: No  . Sexual activity: Not on file  Other Topics Concern  . Not  on file  Social History Narrative  . Not on file   Family History  Problem Relation Age of Onset  . Heart disease Father        Diagnosed age 69  . Kidney cancer Father   . Heart failure Mother   . Cancer Brother   . Breast cancer Neg Hx    Current Outpatient Medications on File Prior to Visit  Medication Sig  . aspirin 81 MG tablet Take 81 mg by mouth daily.    No current facility-administered medications on file prior to visit.     Review of Systems  Constitutional: Negative for activity change, appetite change, chills,  diaphoresis, fatigue, fever and unexpected weight change.  HENT: Negative for congestion, hearing loss and sinus pressure.   Eyes: Negative for visual disturbance.  Respiratory: Negative for apnea, cough, chest tightness, shortness of breath and wheezing.   Cardiovascular: Negative for chest pain, palpitations and leg swelling.  Gastrointestinal: Negative for abdominal distention, abdominal pain, anal bleeding, blood in stool, constipation, diarrhea, nausea and vomiting.  Endocrine: Negative for cold intolerance and polyuria.  Genitourinary: Negative for dysuria, frequency and hematuria.  Musculoskeletal: Negative for arthralgias, back pain and neck pain.  Skin: Negative for rash.  Allergic/Immunologic: Negative for environmental allergies.  Neurological: Negative for dizziness, weakness, light-headedness, numbness and headaches.  Hematological: Negative for adenopathy.  Psychiatric/Behavioral: Positive for sleep disturbance. Negative for behavioral problems, dysphoric mood, self-injury and suicidal ideas. The patient is not nervous/anxious.    Per HPI unless specifically indicated above     Objective:    BP 130/68 (BP Location: Left Arm, Cuff Size: Normal)   Pulse 89   Temp 98.3 F (36.8 C) (Oral)   Resp 16   Ht 5\' 5"  (1.651 m)   Wt 164 lb (74.4 kg)   BMI 27.29 kg/m   Wt Readings from Last 3 Encounters:  06/29/17 164 lb (74.4 kg)  06/21/17 162 lb 3.2 oz (73.6 kg)  12/24/16 173 lb (78.5 kg)    Physical Exam  Constitutional: She is oriented to person, place, and time. She appears well-developed and well-nourished. No distress.  Well-appearing, comfortable, cooperative  HENT:  Head: Normocephalic and atraumatic.  Mouth/Throat: Oropharynx is clear and moist.  Eyes: Conjunctivae and EOM are normal. Pupils are equal, round, and reactive to light. Right eye exhibits no discharge. Left eye exhibits no discharge.  Neck: Normal range of motion. Neck supple. No thyromegaly present.    Cardiovascular: Normal rate, regular rhythm, normal heart sounds and intact distal pulses.  No murmur heard. Pulmonary/Chest: Effort normal and breath sounds normal. No respiratory distress. She has no wheezes. She has no rales.  Abdominal: Soft. Bowel sounds are normal. She exhibits no distension and no mass. There is no tenderness.  Musculoskeletal: Normal range of motion. She exhibits no edema or tenderness.  Upper / Lower Extremities: - Normal muscle tone, strength bilateral upper extremities 5/5, lower extremities 5/5  Lymphadenopathy:    She has no cervical adenopathy.  Neurological: She is alert and oriented to person, place, and time.  Distal sensation intact to light touch all extremities  Skin: Skin is warm and dry. No rash noted. She is not diaphoretic. No erythema.  Psychiatric: She has a normal mood and affect. Her behavior is normal.  Well groomed, good eye contact, normal speech and thoughts. Mildly anxious appearing but improves during visit.  Nursing note and vitals reviewed.    Results for orders placed or performed in visit on 06/21/17  Hemoglobin  A1c  Result Value Ref Range   Hgb A1c MFr Bld 5.7 (H) <5.7 % of total Hgb   Mean Plasma Glucose 117 (calc)   eAG (mmol/L) 6.5 (calc)  CBC with Differential/Platelet  Result Value Ref Range   WBC 8.3 3.8 - 10.8 Thousand/uL   RBC 4.68 3.80 - 5.10 Million/uL   Hemoglobin 14.4 11.7 - 15.5 g/dL   HCT 42.2 35.0 - 45.0 %   MCV 90.2 80.0 - 100.0 fL   MCH 30.8 27.0 - 33.0 pg   MCHC 34.1 32.0 - 36.0 g/dL   RDW 12.7 11.0 - 15.0 %   Platelets 257 140 - 400 Thousand/uL   MPV 9.7 7.5 - 12.5 fL   Neutro Abs 4,698 1,500 - 7,800 cells/uL   Lymphs Abs 2,673 850 - 3,900 cells/uL   WBC mixed population 813 200 - 950 cells/uL   Eosinophils Absolute 58 15 - 500 cells/uL   Basophils Absolute 58 0 - 200 cells/uL   Neutrophils Relative % 56.6 %   Total Lymphocyte 32.2 %   Monocytes Relative 9.8 %   Eosinophils Relative 0.7 %    Basophils Relative 0.7 %  Lipid panel  Result Value Ref Range   Cholesterol 164 <200 mg/dL   HDL 59 >50 mg/dL   Triglycerides 111 <150 mg/dL   LDL Cholesterol (Calc) 84 mg/dL (calc)   Total CHOL/HDL Ratio 2.8 <5.0 (calc)   Non-HDL Cholesterol (Calc) 105 <130 mg/dL (calc)  COMPLETE METABOLIC PANEL WITH GFR  Result Value Ref Range   Glucose, Bld 83 65 - 99 mg/dL   BUN 15 7 - 25 mg/dL   Creat 0.82 0.60 - 0.88 mg/dL   GFR, Est Non African American 67 > OR = 60 mL/min/1.75m2   GFR, Est African American 78 > OR = 60 mL/min/1.17m2   BUN/Creatinine Ratio NOT APPLICABLE 6 - 22 (calc)   Sodium 140 135 - 146 mmol/L   Potassium 3.9 3.5 - 5.3 mmol/L   Chloride 102 98 - 110 mmol/L   CO2 30 20 - 32 mmol/L   Calcium 9.6 8.6 - 10.4 mg/dL   Total Protein 6.8 6.1 - 8.1 g/dL   Albumin 4.3 3.6 - 5.1 g/dL   Globulin 2.5 1.9 - 3.7 g/dL (calc)   AG Ratio 1.7 1.0 - 2.5 (calc)   Total Bilirubin 1.0 0.2 - 1.2 mg/dL   Alkaline phosphatase (APISO) 52 33 - 130 U/L   AST 15 10 - 35 U/L   ALT 15 6 - 29 U/L      Assessment & Plan:   Problem List Items Addressed This Visit    Dyslipidemia    Controlled cholesterol on statin and lifestyle Last lipid panel 05/2017  Plan: 1. Continue current meds - Simvastatin 40mg  daily 2. Continue ASA 81mg  for primary ASCVD risk reduction 3. Encourage improved lifestyle - low carb/cholesterol, reduce portion size, continue improving regular exercise 4. Follow-up 12 mo fasting lipids yearly      Relevant Medications   simvastatin (ZOCOR) 40 MG tablet   Essential hypertension, benign    Well controlled HTN, mild improved but better on re-check Home readings normal Without known complication. No known CAD/MI Off BB Metoprolol  Plan:  1. Continue current BP regimen Amlodipine 5mg , HCTZ 25mg , Lisinopril 40mg  2. Encourage improved lifestyle - low sodium diet, regular exercise 3. Continue monitor BP outside office, bring readings to next visit, if persistently >140/90  or new symptoms notify office sooner 4. Follow-up 6 months  Relevant Medications   amLODipine (NORVASC) 5 MG tablet   hydrochlorothiazide (HYDRODIURIL) 25 MG tablet   lisinopril (PRINIVIL,ZESTRIL) 40 MG tablet   simvastatin (ZOCOR) 40 MG tablet   Insomnia    Stable chronic insomnia, unclear if underlying etiology, without improvement off SSRI - No history of Depression or Anxiety  Plan: 1. Start new med Trazodone 50mg  nightly - reviewed benefits, risk side effect, and dosing strategy gradual increase after 4-6 week to 1.5 or 75mg  may need higher up to 2 pills for 100mg  - in future can consider other meds if ineffective 2. Sleep hygiene briefly reviewed again, last time handout given 3. Follow-up as needed      Relevant Medications   traZODone (DESYREL) 50 MG tablet   Mixed hyperlipidemia   Relevant Medications   amLODipine (NORVASC) 5 MG tablet   hydrochlorothiazide (HYDRODIURIL) 25 MG tablet   lisinopril (PRINIVIL,ZESTRIL) 40 MG tablet   simvastatin (ZOCOR) 40 MG tablet   Paget's disease of vulva    Last seen by Dr Mellody Drown GYN Oncology 09/2016 Stable without known complication, but has history of prior dx since 1980s with recurrences and surgical excisions, as well as 2009 vulvectomy and ultimately further recurrence. She has opted out of surgical or other therapy at last visit.      Pre-diabetes    Well-controlled Pre-DM with A1c 5.7 (improved from 5.9) Concern with HTN, HLD  Plan:  1. Not on any therapy currently - remain off therapy 2. Encourage improved lifestyle - low carb, low sugar diet, reduce portion size, continue improving regular exercise 3. Follow-up q 6-12 months, may do yearly A1c       Other Visit Diagnoses    Annual physical exam    -  Primary      Meds ordered this encounter  Medications  . amLODipine (NORVASC) 5 MG tablet    Sig: Take 1 tablet (5 mg total) by mouth daily.    Dispense:  90 tablet    Refill:  3  . hydrochlorothiazide  (HYDRODIURIL) 25 MG tablet    Sig: Take 1 tablet (25 mg total) by mouth daily.    Dispense:  90 tablet    Refill:  3  . lisinopril (PRINIVIL,ZESTRIL) 40 MG tablet    Sig: Take 1 tablet (40 mg total) by mouth daily.    Dispense:  90 tablet    Refill:  3  . simvastatin (ZOCOR) 40 MG tablet    Sig: Take 1 tablet (40 mg total) by mouth daily.    Dispense:  90 tablet    Refill:  3  . traZODone (DESYREL) 50 MG tablet    Sig: Take 1 tablet (50 mg total) by mouth at bedtime as needed for sleep.    Dispense:  90 tablet    Refill:  3    Follow up plan: Return in about 6 months (around 12/28/2017) for HTN, Insomnia meds.  Nobie Putnam, DO [facility] 06/29/2017, 1:03 PM

## 2017-06-29 NOTE — Assessment & Plan Note (Signed)
Last seen by Dr Mellody Drown GYN Oncology 09/2016 Stable without known complication, but has history of prior dx since 1980s with recurrences and surgical excisions, as well as 2009 vulvectomy and ultimately further recurrence. She has opted out of surgical or other therapy at last visit.

## 2017-06-29 NOTE — Assessment & Plan Note (Signed)
Well controlled HTN, mild improved but better on re-check Home readings normal Without known complication. No known CAD/MI Off BB Metoprolol  Plan:  1. Continue current BP regimen Amlodipine 5mg , HCTZ 25mg , Lisinopril 40mg  2. Encourage improved lifestyle - low sodium diet, regular exercise 3. Continue monitor BP outside office, bring readings to next visit, if persistently >140/90 or new symptoms notify office sooner 4. Follow-up 6 months

## 2017-06-29 NOTE — Patient Instructions (Addendum)
Thank you for coming to the clinic today.  1. May check BP every other week  2. Start new sleeping medicine Trazodone 50mg  tablets - take one at bedtime every night - after 4-6 weeks if not helping enough, then may increase dose to ONE AND HALF tablet, and ultimately  May try 2 tablets for dose 100mg  at night.  Also if still waking up, you may even try to NOT take it nightly before bed and ONLY take ONE if you wake up.  3. Refilled all meds, lab results are great, keep up the good work  Please schedule a Follow-up Appointment to: Return in about 6 months (around 12/28/2017) for HTN, Insomnia meds.  If you have any other questions or concerns, please feel free to call the clinic or send a message through [facility]. You may also schedule an earlier appointment if necessary.  Additionally, you may be receiving a survey about your experience at our clinic within a few days to 1 week by e-mail or mail. We value your feedback.  Nobie Putnam, DO [place]

## 2017-06-29 NOTE — Assessment & Plan Note (Signed)
Controlled cholesterol on statin and lifestyle Last lipid panel 05/2017  Plan: 1. Continue current meds - Simvastatin 40mg  daily 2. Continue ASA 81mg  for primary ASCVD risk reduction 3. Encourage improved lifestyle - low carb/cholesterol, reduce portion size, continue improving regular exercise 4. Follow-up 12 mo fasting lipids yearly

## 2017-06-29 NOTE — Assessment & Plan Note (Signed)
Stable chronic insomnia, unclear if underlying etiology, without improvement off SSRI - No history of Depression or Anxiety  Plan: 1. Start new med Trazodone 50mg  nightly - reviewed benefits, risk side effect, and dosing strategy gradual increase after 4-6 week to 1.5 or 75mg  may need higher up to 2 pills for 100mg  - in future can consider other meds if ineffective 2. Sleep hygiene briefly reviewed again, last time handout given 3. Follow-up as needed

## 2017-06-29 NOTE — Assessment & Plan Note (Signed)
Well-controlled Pre-DM with A1c 5.7 (improved from 5.9) Concern with HTN, HLD  Plan:  1. Not on any therapy currently - remain off therapy 2. Encourage improved lifestyle - low carb, low sugar diet, reduce portion size, continue improving regular exercise 3. Follow-up q 6-12 months, may do yearly A1c

## 2017-08-05 ENCOUNTER — Ambulatory Visit
Admission: RE | Admit: 2017-08-05 | Discharge: 2017-08-05 | Disposition: A | Discharge status: Home / Self Care | Payer: Medicare PPO | Source: Ambulatory Visit | Attending: Obstetrics and Gynecology | Admitting: Obstetrics and Gynecology

## 2017-08-05 DIAGNOSIS — Z1231 Encounter for screening mammogram for malignant neoplasm of breast: Secondary | ICD-10-CM | POA: Insufficient documentation

## 2017-10-12 ENCOUNTER — Inpatient Hospital Stay: Payer: Medicare PPO | Attending: Obstetrics and Gynecology | Admitting: Obstetrics and Gynecology

## 2017-10-12 ENCOUNTER — Encounter: Payer: Self-pay | Admitting: Obstetrics and Gynecology

## 2017-10-12 VITALS — BP 123/85 | HR 94 | Temp 97.3°F | Ht 65.0 in | Wt 168.0 lb

## 2017-10-12 DIAGNOSIS — C519 Malignant neoplasm of vulva, unspecified: Secondary | ICD-10-CM | POA: Diagnosis not present

## 2017-10-12 DIAGNOSIS — C4499 Other specified malignant neoplasm of skin, unspecified: Secondary | ICD-10-CM

## 2017-10-12 NOTE — Progress Notes (Signed)
No new Gyn changes noted

## 2017-10-12 NOTE — Progress Notes (Signed)
Gynecologic Oncology Interval Note  Referring physician: Dr. Ellene Route  Chief Complaint: Paget's disease of the vulva and urethra  Subjective:  Carla Sparks is a 84 y.o. woman who presents today for continued surveillance for history of Paget's disease of the vulva and urethra.   She does not report and itching or irritation in the vulva.  She applies Aquaphor to the urethral area for comfort.  History of SUI and uses a pad.   Oncology Treatment History:  The patient is a pleasant patient with a long history of Paget's disease of the vulva. Her history dates back to the 1980s when she recurrent episodes of Paget's disease of the vulva with multiple resections, and in 2009 a vulvectomy with Dr. Clarene Essex.  On 12/05/2012 she underwent vulvar biopsy that demonstrated recurrent Paget's. Pap was negative. On 02/07/2013 she underwent partial vulvectomy of 2 lesions by Dr. Sabra Heck at [facility] with pathology demonstrating Paget's disease.  At a surveillance visit in 2015 she was noted to have a abnormal appearing urethral lesion and she was referred to Dr. Erlene Quan. She underwent cystoscopy, bilateral retrograde pyelograms, bladder biopsy, urethral biopsy, and interpretation of fluoroscopy less than 30 minutes. The urethral biopsy was positive for Paget's disease. Cystoscopy and bladder biopsies were consistent with cystitis. She was referred to Dr. Bjorn Loser for evaluation. He specializes in reconstructive incontinence procedures. He recommended a second opinion with Dr. Tresa Moore which she declined. She opted not to proceed with surgical intervention.  She did try a trial of topical Efudex therapy. She was able to use this treatment for 5 weeks but stated that her skin became very erythematous. She stop using the medication at that time and it took about 2 weeks for the skin to recover.   She saw Dr. Erlene Quan on 06/26/2015 and had a stable exam with negative cystoscopy for follicular cystitis.    Dr  Theora Gianotti thought she might feel a mass above the vagina at visit 1/17, but pelvic US negative.   Problem List: Patient Active Problem List   Diagnosis Date Noted  . Knee pain 03/19/2016  . Pre-diabetes 08/12/2015  . Paget's disease of vulva 06/26/2015  . Dyslipidemia 06/26/2015  . Lesion of bladder 06/26/2015  . Arthritis of knee, degenerative 06/26/2015  . Post menopausal syndrome 06/26/2015  . Skin lesion 06/26/2015  . Insomnia 04/29/2015  . Paget disease, extra mammary 12/18/2014  . Primary osteoarthritis of both knees 11/04/2014  . Preoperative cardiovascular examination 03/21/2014  . Essential hypertension, benign 03/21/2014  . Mixed hyperlipidemia 03/21/2014    Past Medical History: Past Medical History:  Diagnosis Date  . Arthritis   . Bleeding from the urethra   . Essential hypertension, benign   . HTN (hypertension)   . Insomnia   . Lesion of urethra   . Mixed hyperlipidemia   . Osteoarthritis   . Paget's disease of vulva     Past Surgical History: Past Surgical History:  Procedure Laterality Date  . ABDOMINAL HYSTERECTOMY    . CHOLECYSTECTOMY    . External hemorrhoid surgery    . GALLBLADDER SURGERY    . TONSILLECTOMY    . TOTAL ABDOMINAL HYSTERECTOMY W/ BILATERAL SALPINGOOPHORECTOMY    . VULVECTOMY      Family History: Family History  Problem Relation Age of Onset  . Heart disease Father        Diagnosed age 26  . Kidney cancer Father   . Heart failure Mother   . Cancer Brother   . Breast cancer Neg  Hx     Social History: Social History   Socioeconomic History  . Marital status: Married    Spouse name: Not on file  . Number of children: Not on file  . Years of education: 11  . Highest education level: 12th grade  Social Needs  . Financial resource strain: Not hard at all  . Food insecurity - worry: Never true  . Food insecurity - inability: Never true  . Transportation needs - medical: No  . Transportation needs - non-medical: No   Occupational History  . Not on file  Tobacco Use  . Smoking status: Never Smoker  . Smokeless tobacco: Never Used  Substance and Sexual Activity  . Alcohol use: No  . Drug use: No  . Sexual activity: Not on file  Other Topics Concern  . Not on file  Social History Narrative  . Not on file    Allergies: Allergies  Allergen Reactions  . Eggs Or Egg-Derived Products   . Percocet [Oxycodone-Acetaminophen] Rash    And darvocet    Current Medications: Current Outpatient Medications  Medication Sig Dispense Refill  . amLODipine (NORVASC) 5 MG tablet Take 1 tablet (5 mg total) by mouth daily. 90 tablet 3  . hydrochlorothiazide (HYDRODIURIL) 25 MG tablet Take 1 tablet (25 mg total) by mouth daily. 90 tablet 3  . lisinopril (PRINIVIL,ZESTRIL) 40 MG tablet Take 1 tablet (40 mg total) by mouth daily. 90 tablet 3  . simvastatin (ZOCOR) 40 MG tablet Take 1 tablet (40 mg total) by mouth daily. 90 tablet 3  . traZODone (DESYREL) 50 MG tablet Take 1 tablet (50 mg total) by mouth at bedtime as needed for sleep. 90 tablet 3  . aspirin 81 MG tablet Take 81 mg by mouth daily.      No current facility-administered medications for this visit.     Review of Systems General: no complaints  HEENT: no complaints  Lungs: no complaints  Cardiac: no complaints  GI: no complaints  GU: no complaints  Musculoskeletal: no complaints  Extremities: no complaints  Skin: no complaints  Neuro: no complaints  Endocrine: no complaints  Psych: no complaints         Objective:  Physical Examination:  BP 123/85 (BP Location: Right Arm, Patient Position: Sitting)   Pulse 94   Temp (!) 97.3 F (36.3 C) (Tympanic)   Ht 5\' 5"  (1.651 m)   Wt 168 lb (76.2 kg)   BMI 27.96 kg/m   ECOG Performance Status: 0 - Asymptomatic  General appearance: alert, cooperative and appears stated age HEENT: ATNC Lymph node survey: negative inguinal nodes Abdomen: lower abdomen soft, nontender, nondistended without  masses or ascites Neurological exam reveals alert, oriented, normal speech, no focal findings or movement disorder noted  Pelvic: Vulva: The labial structures are not present due to prior surgery. The vulva appears abnormal due to all the prior surgeries but there are no abnormal areas on the vulva concerning for cancer or obvious Paget's.  The areas of erythema surrounding the urethral meatus and clitoris are present as well as two areas on the right vulva which the patient states is stable. The urethral meatus still appears abnormal with increased erythema. Vagina: normal vagina; BME: 2-3 cm firm "mass" area most likely stool; Uterus: surgically absent, vaginal cuff well healed; Cervix: absent;  Rectal: confirmatory. Likely represents stool in sigmoid colon   See photo archived on 08/05/2014   Assessment:   Carla Sparks is a 84 y.o. female with Paget's  disease of the vulva and urethra since the 1980s s/p resections.  Not symptomatic presently and no active disease in the vulvar area.  There is likely some persistent Paget's disease in the urethral area and has been evaluated and followed by Urology.        Plan:   Problem List Items Addressed This Visit      Musculoskeletal and Integument   Paget disease, extra mammary - Primary     Vaginal "mass" most likely stool, prior imaging negative.  Suggested she return to clinic in1 year, or sooner if symptoms such as itching, irritation or bleeding arise.   Aldara has been discussed as an option in the future if she has recurrences of Paget's disease.    Gillis Ends, MD  CC:  Referring physician: Dr. Ellene Route  Dr. Hollice Espy

## 2017-10-31 DIAGNOSIS — M170 Bilateral primary osteoarthritis of knee: Secondary | ICD-10-CM | POA: Diagnosis not present

## 2017-12-27 ENCOUNTER — Ambulatory Visit: Payer: Medicare PPO | Admitting: Family Medicine

## 2018-01-18 ENCOUNTER — Encounter: Payer: Self-pay | Admitting: Family Medicine

## 2018-01-18 ENCOUNTER — Other Ambulatory Visit: Payer: Self-pay | Admitting: Family Medicine

## 2018-01-18 ENCOUNTER — Ambulatory Visit: Payer: Medicare PPO | Admitting: Family Medicine

## 2018-01-18 VITALS — BP 137/67 | HR 79 | Temp 98.5°F | Resp 16 | Ht 65.0 in | Wt 162.0 lb

## 2018-01-18 DIAGNOSIS — I10 Essential (primary) hypertension: Secondary | ICD-10-CM

## 2018-01-18 DIAGNOSIS — R413 Other amnesia: Secondary | ICD-10-CM

## 2018-01-18 DIAGNOSIS — R233 Spontaneous ecchymoses: Secondary | ICD-10-CM | POA: Insufficient documentation

## 2018-01-18 DIAGNOSIS — R7303 Prediabetes: Secondary | ICD-10-CM

## 2018-01-18 DIAGNOSIS — F5101 Primary insomnia: Secondary | ICD-10-CM

## 2018-01-18 DIAGNOSIS — M170 Bilateral primary osteoarthritis of knee: Secondary | ICD-10-CM | POA: Diagnosis not present

## 2018-01-18 DIAGNOSIS — E782 Mixed hyperlipidemia: Secondary | ICD-10-CM

## 2018-01-18 DIAGNOSIS — R238 Other skin changes: Secondary | ICD-10-CM | POA: Diagnosis not present

## 2018-01-18 DIAGNOSIS — Z0000 Encounter for general adult medical examination without abnormal findings: Secondary | ICD-10-CM

## 2018-01-18 NOTE — Assessment & Plan Note (Signed)
Clinically stable mild memory loss consistent with age 84, without other evidence of cognitive decline or other advanced dementia - She remains very functional  Reassurance given Discussed memory and aging again today Recommend may try Prevagen or other memory supplement OTC - decision is up to her, may not necessarily help as much but it can be tried No recommend rx med at this time or other advanced imaging or referral Future f/u 6 months annual - will add some other labs for TSH, Vitamin B12

## 2018-01-18 NOTE — Assessment & Plan Note (Signed)
Relatively stable, chronic problem Followed by [facility] Regarding her request to try CBD cream, I advised that this is reasonable to consider, discussed how it is not regulate as herbal or supplement, and she should consider the benefits/risk of this medicine

## 2018-01-18 NOTE — Patient Instructions (Addendum)
Thank you for coming to the office today.  Lungs are clear! No other treatment needed.  Discussion on Aspirin 81mg  - I think this is causing your bruising. I am not worried about anything else at this time. We will check blood work as planned next time to rule out other causes. - I recommend to stick with Aspirin daily for prevention - however understand that this is a difficult decision and may increase your risk of bleeding as we discussed. Ultimately though you are in good health overall and I think that this will still benefit you. If you get any significant bleeding or other problem, we will need to stop Aspirin, and at any point if you decide to stop that is fine, and I would say we re-consider this about once a year or at least by age 89  It should be fine to try the CBD topical cream for knee pain arthritis. I cannot definitively say that this will work or if it is recommended treatment.  Mild memory loss is normal for age. Consider the Prevagen supplement if you are interested. Again it may not be the answer or may not work, but it may be worth a trial. There are not other good rx options at this time, which is good news because your problem is more mild at this time and not meeting criteria for dementia.  DUE for FASTING BLOOD WORK (no food or drink after midnight before the lab appointment, only water or coffee without cream/sugar on the morning of)  SCHEDULE "Lab Only" visit in the morning at the clinic for lab draw in 6.5 MONTHS   - Make sure Lab Only appointment is at about 1 week before your next appointment, so that results will be available  For Lab Results, once available within 2-3 days of blood draw, you can can log in to MyChart online to view your results and a brief explanation. Also, we can discuss results at next follow-up visit.   Please schedule a Follow-up Appointment to: Return in about 7 months (around 08/20/2018) for Annual Physical.  If you have any other  questions or concerns, please feel free to call the office or send a message through [place]. You may also schedule an earlier appointment if necessary.  Additionally, you may be receiving a survey about your experience at our office within a few days to 1 week by e-mail or mail. We value your feedback.  Nobie Putnam, DO [place]

## 2018-01-18 NOTE — Assessment & Plan Note (Signed)
Suspect secondary to ASA 81mg  use and age with thinning skin minor injuries arms. Reassurance, long discussionon ASCVD risk and benefits of ASA vs risk of bleeding. Ultimately I advised that newer guidelines and recommendations advise caution with ASA age >60, especially without documented or known CAD/CVA, however given her otherwise good health and long history of ASA use, we come to mutual shared decision that she may continue ASA 81mg  daily for prevention for at least 3 more years, by age 12 or sooner if she chooses I would support her to discontinue this med and just continue statin for prevention risk in future, due to concerns with potential inc bleed risk, advised return criteria if any serious injury or symptoms

## 2018-01-18 NOTE — Assessment & Plan Note (Signed)
Well controlled HTN Home readings normal Without known complication. No known CAD/MI Off BB Metoprolol  Plan:  1. Continue current BP regimen Amlodipine 5mg , HCTZ 25mg , Lisinopril 40mg  2. Encourage improved lifestyle - low sodium diet, regular exercise 3. Continue monitor BP outside office, bring readings to next visit, if persistently >140/90 or new symptoms notify office sooner 4. Follow-up 6 months - future labs

## 2018-01-18 NOTE — Progress Notes (Signed)
Subjective:    Patient ID: Carla Sparks, female    DOB: 02/20/1934, 84 y.o.   MRN: [id number]  Carla Sparks is a 84 y.o. female presenting on 01/18/2018 for Hypertension (6 months )   HPI   CHRONIC HTN: Reports no new concerns, thinks her BP is doing well. She has home log avg 120-130 / 70s Current Meds - Amlodipine 5mg  daily, HCTZ 25mg  daily, Lisinopril 40mg  daily   Reports good compliance, took meds today. Tolerating well, w/o complaints. Denies CP, dyspnea, HA, edema, dizziness / lightheadedness  Easy Bruising She is taking ASA 81mg  daily, chronically for past >20+ years for cardiovascular prevention. She has fam history of this but no personal history of CVD or prior event MI / CVA. She would like to keep taking ASA, but now noticing increased small bruises or spots on skin, over past few weeks to months. She was told it is due to "thin skin" by someone else. She is asking if should keep ASA or not. She takes a statin as well for lipids.  Osteoarthritis and Pain Bilateral Knees Followed by [facility], has received steroid injections and management. She is asking today about potential CBD Oil benefits, states her sister uses it and improved pain from other conditions.  Mild Memory Loss - Last visit with me for same problem 12/2016. treated with reassurance and discussion on memory function and age, she was encouraged to stay mentally active and try OTC supplement if preferred, see prior notes for background information. - Interval update with tried "Focus Factor" but did not think it helped, she stopped this. - Today patient reports still same problem, easy forgetful or memory loss, mostly with recall, forget why she went into room or recall name of someone, this is self limited and she usually remembers promptly 15-30 seconds or later. - No new concerns or behavioral symptoms, no wandering or difficulty with daily tasks or activities - Family history with  Maternal uncle and aunt with Alzheimer's Dementia - She is asking about Prevagen or other rx medications   Additional questions: - Recent URI / Cough - she is asking to "check her lungs" at her husband's request, she feels better overall and states back to normal. Denies fever chills cough or other symptoms. Not on medicine.   No flowsheet data found.  6CIT Screen 06/21/2017  What Year? 0 points  What month? 0 points  What time? 0 points  Count back from 20 0 points  Months in reverse 0 points  Repeat phrase 0 points  Total Score 0    Family History  Problem Relation Age of Onset  . Heart disease Father        Diagnosed age 68  . Kidney cancer Father   . Heart failure Mother   . Cancer Brother   . Alzheimer's disease Maternal Aunt   . Alzheimer's disease Maternal Uncle   . Breast cancer Neg Hx     Depression screen [facility] 2/9 01/18/2018 06/29/2017 06/21/2017  Decreased Interest 0 0 0  Down, Depressed, Hopeless 0 0 0  PHQ - 2 Score 0 0 0    Social History   Tobacco Use  . Smoking status: Never Smoker  . Smokeless tobacco: Never Used  Substance Use Topics  . Alcohol use: No  . Drug use: No    Review of Systems Per HPI unless specifically indicated above     Objective:    BP 137/67   Pulse 79   Temp  98.5 F (36.9 C) (Oral)   Resp 16   Ht 5\' 5"  (1.651 m)   Wt 162 lb (73.5 kg)   BMI 26.96 kg/m   Wt Readings from Last 3 Encounters:  01/18/18 162 lb (73.5 kg)  10/12/17 168 lb (76.2 kg)  06/29/17 164 lb (74.4 kg)    Physical Exam  Constitutional: She is oriented to person, place, and time. She appears well-developed and well-nourished. No distress.  Well-appearing 84 yr old female, comfortable, cooperative  HENT:  Head: Normocephalic and atraumatic.  Mouth/Throat: Oropharynx is clear and moist.  Eyes: Conjunctivae are normal. Right eye exhibits no discharge. Left eye exhibits no discharge.  Neck: Normal range of motion. Neck supple. No thyromegaly  present.  Cardiovascular: Normal rate, regular rhythm, normal heart sounds and intact distal pulses.  No murmur heard. Pulmonary/Chest: Effort normal and breath sounds normal. No respiratory distress. She has no wheezes. She has no rales.  Musculoskeletal: Normal range of motion. She exhibits no edema.  Lymphadenopathy:    She has no cervical adenopathy.  Neurological: She is alert and oriented to person, place, and time.  Skin: Skin is warm and dry. No rash noted. She is not diaphoretic. No erythema.  Scattered various stages patchy areas of small ecchymosis bilateral forearm and some lower extremity  Psychiatric: She has a normal mood and affect. Her behavior is normal.  Well groomed, good eye contact, normal speech and thoughts  Nursing note and vitals reviewed.  Results for orders placed or performed in visit on 06/21/17  Hemoglobin A1c  Result Value Ref Range   Hgb A1c MFr Bld 5.7 (H) <5.7 % of total Hgb   Mean Plasma Glucose 117 (calc)   eAG (mmol/L) 6.5 (calc)  CBC with Differential/Platelet  Result Value Ref Range   WBC 8.3 3.8 - 10.8 Thousand/uL   RBC 4.68 3.80 - 5.10 Million/uL   Hemoglobin 14.4 11.7 - 15.5 g/dL   HCT 42.2 35.0 - 45.0 %   MCV 90.2 80.0 - 100.0 fL   MCH 30.8 27.0 - 33.0 pg   MCHC 34.1 32.0 - 36.0 g/dL   RDW 12.7 11.0 - 15.0 %   Platelets 257 140 - 400 Thousand/uL   MPV 9.7 7.5 - 12.5 fL   Neutro Abs 4,698 1,500 - 7,800 cells/uL   Lymphs Abs 2,673 850 - 3,900 cells/uL   WBC mixed population 813 200 - 950 cells/uL   Eosinophils Absolute 58 15 - 500 cells/uL   Basophils Absolute 58 0 - 200 cells/uL   Neutrophils Relative % 56.6 %   Total Lymphocyte 32.2 %   Monocytes Relative 9.8 %   Eosinophils Relative 0.7 %   Basophils Relative 0.7 %  Lipid panel  Result Value Ref Range   Cholesterol 164 <200 mg/dL   HDL 59 >50 mg/dL   Triglycerides 111 <150 mg/dL   LDL Cholesterol (Calc) 84 mg/dL (calc)   Total CHOL/HDL Ratio 2.8 <5.0 (calc)   Non-HDL  Cholesterol (Calc) 105 <130 mg/dL (calc)  COMPLETE METABOLIC PANEL WITH GFR  Result Value Ref Range   Glucose, Bld 83 65 - 99 mg/dL   BUN 15 7 - 25 mg/dL   Creat 0.82 0.60 - 0.88 mg/dL   GFR, Est Non African American 67 > OR = 60 mL/min/1.27m2   GFR, Est African American 78 > OR = 60 mL/min/1.100m2   BUN/Creatinine Ratio NOT APPLICABLE 6 - 22 (calc)   Sodium 140 135 - 146 mmol/L   Potassium 3.9 3.5 -  5.3 mmol/L   Chloride 102 98 - 110 mmol/L   CO2 30 20 - 32 mmol/L   Calcium 9.6 8.6 - 10.4 mg/dL   Total Protein 6.8 6.1 - 8.1 g/dL   Albumin 4.3 3.6 - 5.1 g/dL   Globulin 2.5 1.9 - 3.7 g/dL (calc)   AG Ratio 1.7 1.0 - 2.5 (calc)   Total Bilirubin 1.0 0.2 - 1.2 mg/dL   Alkaline phosphatase (APISO) 52 33 - 130 U/L   AST 15 10 - 35 U/L   ALT 15 6 - 29 U/L      Assessment & Plan:   Problem List Items Addressed This Visit    Arthritis of knee, degenerative    Relatively stable, chronic problem Followed by [facility] Regarding her request to try CBD cream, I advised that this is reasonable to consider, discussed how it is not regulate as herbal or supplement, and she should consider the benefits/risk of this medicine      Easy bruising    Suspect secondary to ASA 81mg  use and age with thinning skin minor injuries arms. Reassurance, long discussionon ASCVD risk and benefits of ASA vs risk of bleeding. Ultimately I advised that newer guidelines and recommendations advise caution with ASA age >1, especially without documented or known CAD/CVA, however given her otherwise good health and long history of ASA use, we come to mutual shared decision that she may continue ASA 81mg  daily for prevention for at least 3 more years, by age 24 or sooner if she chooses I would support her to discontinue this med and just continue statin for prevention risk in future, due to concerns with potential inc bleed risk, advised return criteria if any serious injury or symptoms      Essential hypertension, benign  - Primary    Well controlled HTN Home readings normal Without known complication. No known CAD/MI Off BB Metoprolol  Plan:  1. Continue current BP regimen Amlodipine 5mg , HCTZ 25mg , Lisinopril 40mg  2. Encourage improved lifestyle - low sodium diet, regular exercise 3. Continue monitor BP outside office, bring readings to next visit, if persistently >140/90 or new symptoms notify office sooner 4. Follow-up 6 months - future labs      Memory loss    Clinically stable mild memory loss consistent with age 84, without other evidence of cognitive decline or other advanced dementia - She remains very functional  Reassurance given Discussed memory and aging again today Recommend may try Prevagen or other memory supplement OTC - decision is up to her, may not necessarily help as much but it can be tried No recommend rx med at this time or other advanced imaging or referral Future f/u 6 months annual - will add some other labs for TSH, Vitamin B12          No orders of the defined types were placed in this encounter.   Follow up plan: Return in about 7 months (around 08/20/2018) for Annual Physical.  Future labs ordered for 08/16/18  A total of 25 minutes was spent face-to-face with this patient. Greater than 50% of this time (approximately 15 minutes) was spent in counseling on ASA use in ASCVD risk reduction benefit/risk and also Memory and cognitive decline normal with aging and comparison to dementia, treatment, testing, and lifestyle changes.  Nobie Putnam, [facility] 01/18/2018, 1:33 PM

## 2018-03-17 DIAGNOSIS — G8929 Other chronic pain: Secondary | ICD-10-CM | POA: Diagnosis not present

## 2018-03-17 DIAGNOSIS — M25562 Pain in left knee: Secondary | ICD-10-CM | POA: Diagnosis not present

## 2018-03-17 DIAGNOSIS — M25561 Pain in right knee: Secondary | ICD-10-CM | POA: Diagnosis not present

## 2018-03-17 DIAGNOSIS — M170 Bilateral primary osteoarthritis of knee: Secondary | ICD-10-CM | POA: Diagnosis not present

## 2018-08-13 DIAGNOSIS — H66001 Acute suppurative otitis media without spontaneous rupture of ear drum, right ear: Secondary | ICD-10-CM | POA: Diagnosis not present

## 2018-08-13 DIAGNOSIS — H6121 Impacted cerumen, right ear: Secondary | ICD-10-CM | POA: Diagnosis not present

## 2018-08-16 ENCOUNTER — Other Ambulatory Visit: Payer: Medicare PPO

## 2018-08-16 DIAGNOSIS — R413 Other amnesia: Secondary | ICD-10-CM | POA: Diagnosis not present

## 2018-08-16 DIAGNOSIS — R7303 Prediabetes: Secondary | ICD-10-CM

## 2018-08-16 DIAGNOSIS — F5101 Primary insomnia: Secondary | ICD-10-CM

## 2018-08-16 DIAGNOSIS — E782 Mixed hyperlipidemia: Secondary | ICD-10-CM | POA: Diagnosis not present

## 2018-08-16 DIAGNOSIS — I10 Essential (primary) hypertension: Secondary | ICD-10-CM | POA: Diagnosis not present

## 2018-08-16 DIAGNOSIS — Z0000 Encounter for general adult medical examination without abnormal findings: Secondary | ICD-10-CM | POA: Diagnosis not present

## 2018-08-17 LAB — CBC WITH DIFFERENTIAL/PLATELET
Absolute Monocytes: 656 cells/uL (ref 200–950)
Basophils Absolute: 90 cells/uL (ref 0–200)
Basophils Relative: 1.3 %
Eosinophils Absolute: 297 cells/uL (ref 15–500)
Eosinophils Relative: 4.3 %
HEMATOCRIT: 44.6 % (ref 35.0–45.0)
Hemoglobin: 15 g/dL (ref 11.7–15.5)
Lymphs Abs: 2450 cells/uL (ref 850–3900)
MCH: 30.9 pg (ref 27.0–33.0)
MCHC: 33.6 g/dL (ref 32.0–36.0)
MCV: 92 fL (ref 80.0–100.0)
MPV: 10 fL (ref 7.5–12.5)
Monocytes Relative: 9.5 %
Neutro Abs: 3409 cells/uL (ref 1500–7800)
Neutrophils Relative %: 49.4 %
Platelets: 264 10*3/uL (ref 140–400)
RBC: 4.85 10*6/uL (ref 3.80–5.10)
RDW: 12 % (ref 11.0–15.0)
Total Lymphocyte: 35.5 %
WBC: 6.9 10*3/uL (ref 3.8–10.8)

## 2018-08-17 LAB — COMPLETE METABOLIC PANEL WITH GFR
AG Ratio: 1.6 (calc) (ref 1.0–2.5)
ALBUMIN MSPROF: 4.4 g/dL (ref 3.6–5.1)
ALT: 24 U/L (ref 6–29)
AST: 22 U/L (ref 10–35)
Alkaline phosphatase (APISO): 58 U/L (ref 33–130)
BILIRUBIN TOTAL: 1 mg/dL (ref 0.2–1.2)
BUN: 13 mg/dL (ref 7–25)
CO2: 32 mmol/L (ref 20–32)
Calcium: 10 mg/dL (ref 8.6–10.4)
Chloride: 103 mmol/L (ref 98–110)
Creat: 0.82 mg/dL (ref 0.60–0.88)
GFR, Est African American: 77 mL/min/{1.73_m2} (ref >=60)
GFR, Est Non African American: 67 mL/min/{1.73_m2} (ref >=60)
Globulin: 2.8 g/dL (calc) (ref 1.9–3.7)
Glucose, Bld: 101 mg/dL — ABNORMAL HIGH (ref 65–99)
Potassium: 4.4 mmol/L (ref 3.5–5.3)
Sodium: 142 mmol/L (ref 135–146)
TOTAL PROTEIN: 7.2 g/dL (ref 6.1–8.1)

## 2018-08-17 LAB — LIPID PANEL
CHOLESTEROL: 161 mg/dL (ref <200)
HDL: 51 mg/dL (ref >50)
LDL Cholesterol (Calc): 81 mg/dL (calc)
Non-HDL Cholesterol (Calc): 110 mg/dL (calc) (ref <130)
Total CHOL/HDL Ratio: 3.2 (calc) (ref <5.0)
Triglycerides: 191 mg/dL — ABNORMAL HIGH (ref <150)

## 2018-08-17 LAB — VITAMIN B12: Vitamin B-12: 888 pg/mL (ref 200–1100)

## 2018-08-17 LAB — TSH: TSH: 1.94 mIU/L (ref 0.40–4.50)

## 2018-08-17 LAB — HEMOGLOBIN A1C
Hgb A1c MFr Bld: 5.3 % of total Hgb (ref <5.7)
Mean Plasma Glucose: 105 (calc)
eAG (mmol/L): 5.8 (calc)

## 2018-08-23 ENCOUNTER — Encounter: Payer: Self-pay | Admitting: Family Medicine

## 2018-08-23 ENCOUNTER — Ambulatory Visit (INDEPENDENT_AMBULATORY_CARE_PROVIDER_SITE_OTHER): Payer: Medicare PPO | Admitting: Family Medicine

## 2018-08-23 VITALS — BP 121/60 | HR 88 | Temp 98.3°F | Resp 16 | Ht 65.0 in | Wt 159.2 lb

## 2018-08-23 DIAGNOSIS — C519 Malignant neoplasm of vulva, unspecified: Secondary | ICD-10-CM

## 2018-08-23 DIAGNOSIS — Z0000 Encounter for general adult medical examination without abnormal findings: Secondary | ICD-10-CM | POA: Diagnosis not present

## 2018-08-23 DIAGNOSIS — R238 Other skin changes: Secondary | ICD-10-CM | POA: Diagnosis not present

## 2018-08-23 DIAGNOSIS — F5101 Primary insomnia: Secondary | ICD-10-CM | POA: Diagnosis not present

## 2018-08-23 DIAGNOSIS — C4499 Other specified malignant neoplasm of skin, unspecified: Secondary | ICD-10-CM

## 2018-08-23 DIAGNOSIS — I10 Essential (primary) hypertension: Secondary | ICD-10-CM

## 2018-08-23 DIAGNOSIS — R7303 Prediabetes: Secondary | ICD-10-CM

## 2018-08-23 DIAGNOSIS — R233 Spontaneous ecchymoses: Secondary | ICD-10-CM

## 2018-08-23 DIAGNOSIS — E782 Mixed hyperlipidemia: Secondary | ICD-10-CM | POA: Diagnosis not present

## 2018-08-23 MED ORDER — TRAZODONE HCL 100 MG PO TABS: 100.0000 mg | ORAL_TABLET | Freq: Every day | ORAL | 3 refills | Status: DC

## 2018-08-23 MED ORDER — AMLODIPINE BESYLATE 5 MG PO TABS: 5.0000 mg | ORAL_TABLET | Freq: Every day | ORAL | 3 refills | Status: DC

## 2018-08-23 MED ORDER — SIMVASTATIN 40 MG PO TABS: 40.0000 mg | ORAL_TABLET | Freq: Every day | ORAL | 3 refills | Status: DC

## 2018-08-23 MED ORDER — LISINOPRIL 40 MG PO TABS: 40.0000 mg | ORAL_TABLET | Freq: Every day | ORAL | 3 refills | Status: DC

## 2018-08-23 MED ORDER — HYDROCHLOROTHIAZIDE 25 MG PO TABS: 25.0000 mg | ORAL_TABLET | Freq: Every day | ORAL | 3 refills | Status: DC

## 2018-08-23 NOTE — Patient Instructions (Addendum)
Thank you for coming to the office today.  New higher dose Trazodone '100mg'$  sent to pharmacy - start with HALF pill for about 6 days or 1 week, then go up to 1 whole pill every night.  All other meds refilled.  Stop Aspirin '81mg'$ . Due to bruising and bleeding. You can continue Simvastatin cholesterol medicine instead.  Your A1c sugar is improved. No longer in Pre-Diabetic range.  Vitamin B12 is normal. Just continue Multivitamin daily.  Colon Cancer Screening: - For all adults age 93+ routine colon cancer screening is highly recommended.     - Recent guidelines from [place] recommend starting age of 69 - Early detection of colon cancer is important, because often there are no warning signs or symptoms, also if found early usually it can be cured. Late stage is hard to treat.  - If you are not interested in Colonoscopy screening (if done and normal you could be cleared for 5 to 10 years until next due), then Cologuard is an excellent alternative for screening test for Colon Cancer. It is highly sensitive for detecting DNA of colon cancer from even the earliest stages. Also, there is NO bowel prep required. - If Cologuard is NEGATIVE, then it is good for 3 years before next due - If Cologuard is POSITIVE, then it is strongly advised to get a Colonoscopy, which allows the GI doctor to locate the source of the cancer or polyp (even very early stage) and treat it by removing it. ------------------------- If you would like to proceed with Cologuard (stool DNA test)  Medicare covers the test 100%  It can be done up until age 67  - If you want to proceed, you can notify us by phone and we will order it for you. The test kit will be delivered to you house within about 1 week. Follow instructions to collect sample, you may call the company for any help or questions, 24/7 telephone support at [phone].   Please schedule a Follow-up Appointment to: Return in about 6 months  (around 02/21/2019) for Follow-up 6 months for PreDM A1c, HTN, Insomnia, Bruising.  If you have any other questions or concerns, please feel free to call the office or send a message through [place]. You may also schedule an earlier appointment if necessary.  Additionally, you may be receiving a survey about your experience at our office within a few days to 1 week by e-mail or mail. We value your feedback.  Nobie Putnam, DO [place]

## 2018-08-23 NOTE — Progress Notes (Signed)
Subjective:    Patient ID: Carla Sparks, female    DOB: 01/07/1934, 85 y.o.   MRN: [id number]  Carla Sparks is a 85 y.o. female presenting on 08/23/2018 for Annual Exam   HPI   Here for Annual Physical and Lab Review.  Pre-Diabetes / Hyperlipidemia Reports no concerns. Previously A1c 5.7 to 5.9. Now lab shows 5.3 Lab with controlled LDL 81. Normal HDL 51. Elevated TG 191. CBGs: Not checking Meds: None Currently on ACEi, Statin simvastatin Lifestyle: - Diet (balanced diet)  - Exercise (regular exercise) Denies hypoglycemia  CHRONIC HTN: Reports no new concerns, thinks her BP is doing well. She has home log avg 120-130 / 70s Current Meds - Amlodipine 5mg  daily, HCTZ 25mg  daily, Lisinopril 40mg  daily   Reports good compliance, took meds today. Tolerating well, w/o complaints.  Paget's Disease of Vulva Previous followed by Oncology, has been discharged essentially without recurrence or complication, did not have vulvectomy or surgical therapy in past.  Easy Bruising Chronic use ASA 81. Easy bruise with bump or scrape.   Insomnia, Chronic Previously on Trazodone 1.5 for dose 75mg  nightly was helpful, came off for several months ran out, interested to resume now. - Describes sleep onset insomnia often, some difficulty "shutting off mind" - No coffee, some tea  Mild Memory Loss Normal lab review from recent lab. Vitamin B12, TSH - No new concerns or behavioral symptoms, no wandering or difficulty with daily tasks or activities -Family history withMaternal uncle and aunt with Alzheimer's Dementia   Additional concern Impacted R ear cerumen Recently about 2 weeks ago went to [facility], on 08/13/18, they flushed R impacted ear wax, also gave Augmentin, finished this, gave her some diarrhea. She uses heating pad PRN on side of head. Now improved pain. Wanted her ear checked. She has history of some ear pain and sensitivity if wind blowing on R side  Health  Maintenance:  Colon CA Screening: Last Colonoscopy (done by out of state in [place]), results without polyps about >10+ years. Currently asymptomatic. No known family history of colon CA. Due for screening test -offered cologuard w/ counseling up until 87 and covered but she is not ready at this time, will reconsider.   Depression screen [facility] 2/9 08/23/2018 01/18/2018 06/29/2017  Decreased Interest 0 0 0  Down, Depressed, Hopeless 0 0 0  PHQ - 2 Score 0 0 0    Past Medical History:  Diagnosis Date  . Arthritis   . Bleeding from the urethra   . Essential hypertension, benign   . HTN (hypertension)   . Insomnia   . Lesion of urethra   . Mixed hyperlipidemia   . Osteoarthritis   . Paget's disease of vulva    Past Surgical History:  Procedure Laterality Date  . ABDOMINAL HYSTERECTOMY    . CHOLECYSTECTOMY    . External hemorrhoid surgery    . GALLBLADDER SURGERY    . TONSILLECTOMY    . TOTAL ABDOMINAL HYSTERECTOMY W/ BILATERAL SALPINGOOPHORECTOMY    . VULVECTOMY     Social History   Socioeconomic History  . Marital status: Married    Spouse name: Not on file  . Number of children: Not on file  . Years of education: 57  . Highest education level: 12th grade  Occupational History  . Not on file  Social Needs  . Financial resource strain: Not hard at all  . Food insecurity:    Worry: Never true    Inability: Never true  .  Transportation needs:    Medical: No    Non-medical: No  Tobacco Use  . Smoking status: Never Smoker  . Smokeless tobacco: Never Used  Substance and Sexual Activity  . Alcohol use: No  . Drug use: No  . Sexual activity: Not on file  Lifestyle  . Physical activity:    Days per week: 5 days    Minutes per session: 20 min  . Stress: Not at all  Relationships  . Social connections:    Talks on phone: More than three times a week    Gets together: Once a week    Attends religious service: Never    Active member of club or organization: No     Attends meetings of clubs or organizations: Never    Relationship status: Married  . Intimate partner violence:    Fear of current or ex partner: No    Emotionally abused: No    Physically abused: No    Forced sexual activity: No  Other Topics Concern  . Not on file  Social History Narrative  . Not on file   Family History  Problem Relation Age of Onset  . Heart disease Father        Diagnosed age 41  . Kidney cancer Father   . Heart failure Mother   . Cancer Brother   . Alzheimer's disease Maternal Aunt   . Alzheimer's disease Maternal Uncle   . Breast cancer Neg Hx    No current outpatient medications on file prior to visit.   No current facility-administered medications on file prior to visit.     Review of Systems  Constitutional: Negative for activity change, appetite change, chills, diaphoresis, fatigue and fever.  HENT: Negative for congestion and hearing loss.   Eyes: Negative for visual disturbance.  Respiratory: Negative for apnea, cough, chest tightness, shortness of breath and wheezing.   Cardiovascular: Negative for chest pain, palpitations and leg swelling.  Gastrointestinal: Negative for abdominal pain, constipation, diarrhea, nausea and vomiting.  Endocrine: Negative for cold intolerance.  Genitourinary: Negative for difficulty urinating, dysuria, frequency and hematuria.  Musculoskeletal: Negative for arthralgias and neck pain.  Skin: Negative for rash.  Allergic/Immunologic: Negative for environmental allergies.  Neurological: Negative for dizziness, weakness, light-headedness, numbness and headaches.  Hematological: Negative for adenopathy.  Psychiatric/Behavioral: Negative for behavioral problems, dysphoric mood and sleep disturbance.   Per HPI unless specifically indicated above     Objective:    BP 121/60   Pulse 88   Temp 98.3 F (36.8 C) (Oral)   Resp 16   Ht 5\' 5"  (1.651 m)   Wt 159 lb 3.2 oz (72.2 kg)   BMI 26.49 kg/m   Wt Readings  from Last 3 Encounters:  08/23/18 159 lb 3.2 oz (72.2 kg)  01/18/18 162 lb (73.5 kg)  10/12/17 168 lb (76.2 kg)    Physical Exam Vitals signs and nursing note reviewed.  Constitutional:      General: She is not in acute distress.    Appearance: She is well-developed. She is not diaphoretic.     Comments: Well-appearing, comfortable, cooperative  HENT:     Head: Normocephalic and atraumatic.     Comments: Frontal / maxillary sinuses non-tender. Nares patent without purulence or edema. Bilateral TMs clear without erythema, effusion or bulging. Oropharynx clear without erythema, exudates, edema or asymmetry. Eyes:     General:        Right eye: No discharge.        Left  eye: No discharge.     Conjunctiva/sclera: Conjunctivae normal.     Pupils: Pupils are equal, round, and reactive to light.  Neck:     Musculoskeletal: Normal range of motion and neck supple.     Thyroid: No thyromegaly.  Cardiovascular:     Rate and Rhythm: Normal rate and regular rhythm.     Heart sounds: Normal heart sounds. No murmur.  Pulmonary:     Effort: Pulmonary effort is normal. No respiratory distress.     Breath sounds: Normal breath sounds. No wheezing or rales.  Abdominal:     General: Bowel sounds are normal. There is no distension.     Palpations: Abdomen is soft. There is no mass.     Tenderness: There is no abdominal tenderness.  Musculoskeletal: Normal range of motion.        General: No tenderness.     Comments: Upper / Lower Extremities: - Normal muscle tone, strength bilateral upper extremities 5/5, lower extremities 5/5  Lymphadenopathy:     Cervical: No cervical adenopathy.  Skin:    General: Skin is warm and dry.     Findings: No erythema or rash.  Neurological:     Mental Status: She is alert and oriented to person, place, and time.     Comments: Distal sensation intact to light touch all extremities  Psychiatric:        Behavior: Behavior normal.     Comments: Well groomed, good  eye contact, normal speech and thoughts    Results for orders placed or performed in visit on 08/16/18  TSH  Result Value Ref Range   TSH 1.94 0.40 - 4.50 mIU/L  Vitamin B12  Result Value Ref Range   Vitamin B-12 888 200 - 1,100 pg/mL  Lipid panel  Result Value Ref Range   Cholesterol 161 <200 mg/dL   HDL 51 >50 mg/dL   Triglycerides 191 (H) <150 mg/dL   LDL Cholesterol (Calc) 81 mg/dL (calc)   Total CHOL/HDL Ratio 3.2 <5.0 (calc)   Non-HDL Cholesterol (Calc) 110 <130 mg/dL (calc)  COMPLETE METABOLIC PANEL WITH GFR  Result Value Ref Range   Glucose, Bld 101 (H) 65 - 99 mg/dL   BUN 13 7 - 25 mg/dL   Creat 0.82 0.60 - 0.88 mg/dL   GFR, Est Non African American 67 > OR = 60 mL/min/1.43m2   GFR, Est African American 77 > OR = 60 mL/min/1.47m2   BUN/Creatinine Ratio NOT APPLICABLE 6 - 22 (calc)   Sodium 142 135 - 146 mmol/L   Potassium 4.4 3.5 - 5.3 mmol/L   Chloride 103 98 - 110 mmol/L   CO2 32 20 - 32 mmol/L   Calcium 10.0 8.6 - 10.4 mg/dL   Total Protein 7.2 6.1 - 8.1 g/dL   Albumin 4.4 3.6 - 5.1 g/dL   Globulin 2.8 1.9 - 3.7 g/dL (calc)   AG Ratio 1.6 1.0 - 2.5 (calc)   Total Bilirubin 1.0 0.2 - 1.2 mg/dL   Alkaline phosphatase (APISO) 58 33 - 130 U/L   AST 22 10 - 35 U/L   ALT 24 6 - 29 U/L  CBC with Differential/Platelet  Result Value Ref Range   WBC 6.9 3.8 - 10.8 Thousand/uL   RBC 4.85 3.80 - 5.10 Million/uL   Hemoglobin 15.0 11.7 - 15.5 g/dL   HCT 44.6 35.0 - 45.0 %   MCV 92.0 80.0 - 100.0 fL   MCH 30.9 27.0 - 33.0 pg   MCHC 33.6 32.0 -  36.0 g/dL   RDW 12.0 11.0 - 15.0 %   Platelets 264 140 - 400 Thousand/uL   MPV 10.0 7.5 - 12.5 fL   Neutro Abs 3,409 1,500 - 7,800 cells/uL   Lymphs Abs 2,450 850 - 3,900 cells/uL   Absolute Monocytes 656 200 - 950 cells/uL   Eosinophils Absolute 297 15 - 500 cells/uL   Basophils Absolute 90 0 - 200 cells/uL   Neutrophils Relative % 49.4 %   Total Lymphocyte 35.5 %   Monocytes Relative 9.5 %   Eosinophils Relative 4.3 %     Basophils Relative 1.3 %  Hemoglobin A1c  Result Value Ref Range   Hgb A1c MFr Bld 5.3 <5.7 % of total Hgb   Mean Plasma Glucose 105 (calc)   eAG (mmol/L) 5.8 (calc)      Assessment & Plan:   Problem List Items Addressed This Visit    Easy bruising Discontinue ASA 81, given symptomatic and age, reviewed risk Continue on STatin for ASCVD    Essential hypertension, benign  Well-controlled HTN - Home BP readings near normal  No known complications   Plan:  1. Continue current BP regimen Amlodipine 5mg  daily, HCTZ 25mg  daily, Lisinopril 40mg  daily   2. Encourage improved lifestyle - low sodium diet, regular exercise 3. Continue monitor BP outside office, bring readings to next visit, if persistently >140/90 or new symptoms notify office sooner     Relevant Medications   amLODipine (NORVASC) 5 MG tablet   hydrochlorothiazide (HYDRODIURIL) 25 MG tablet   lisinopril (PRINIVIL,ZESTRIL) 40 MG tablet   simvastatin (ZOCOR) 40 MG tablet   Insomnia Persistent symptoms Restart Trazodone 100mg  nightly - start half tab 50mg  nightly for 1 week then increase    Relevant Medications   traZODone (DESYREL) 100 MG tablet   Mixed hyperlipidemia Mostly controlled on Statin Providing ASCVD risk reduction STOP ASA due to bruising/bleeding    Relevant Medications   amLODipine (NORVASC) 5 MG tablet   hydrochlorothiazide (HYDRODIURIL) 25 MG tablet   lisinopril (PRINIVIL,ZESTRIL) 40 MG tablet   simvastatin (ZOCOR) 40 MG tablet   Paget's disease of vulva Stable without known recurrence Return to Onc if indicated in future    Pre-diabetes Stable, well controlled on lifestyle intervention Not on med Follow-up q 6 mo A1c     Other Visit Diagnoses    Annual physical exam    -  Primary      Updated Health Maintenance information Offered Cologuard - notify when ready Reviewed recent lab results with patient Encouraged improvement to lifestyle with diet and exercise  Meds ordered this  encounter  Medications  . amLODipine (NORVASC) 5 MG tablet    Sig: Take 1 tablet (5 mg total) by mouth daily.    Dispense:  90 tablet    Refill:  3  . hydrochlorothiazide (HYDRODIURIL) 25 MG tablet    Sig: Take 1 tablet (25 mg total) by mouth daily.    Dispense:  90 tablet    Refill:  3  . lisinopril (PRINIVIL,ZESTRIL) 40 MG tablet    Sig: Take 1 tablet (40 mg total) by mouth daily.    Dispense:  90 tablet    Refill:  3  . simvastatin (ZOCOR) 40 MG tablet    Sig: Take 1 tablet (40 mg total) by mouth daily.    Dispense:  90 tablet    Refill:  3  . traZODone (DESYREL) 100 MG tablet    Sig: Take 1 tablet (100 mg total) by mouth at  bedtime.    Dispense:  90 tablet    Refill:  3    Follow up plan: Return in about 6 months (around 02/21/2019) for Follow-up 6 months for PreDM A1c, HTN, Insomnia, Bruising.  Nobie Putnam, [facility] 08/23/2018, 10:18 AM

## 2018-09-29 DIAGNOSIS — M170 Bilateral primary osteoarthritis of knee: Secondary | ICD-10-CM | POA: Diagnosis not present

## 2018-10-16 ENCOUNTER — Telehealth: Payer: Self-pay | Admitting: Nurse Practitioner

## 2018-10-16 NOTE — Telephone Encounter (Signed)
Called patient to advise that in response to COVID-19 Pandemic and to ensure the continuous provision of safe, quality care for our patients, communities, staff, and providers, [facility] line has advised [place] to temporarily alter scheduling and appointments of our patients. We discussed that patient is currently being seen for surveillance of Paget's Disease.   We discussed specific symptoms that would warrant returning to clinic sooner and patient advised that she is currently asymptomatic, feels at baseline, and displays none of these symptoms. Given this, I recommended rescheduling for approximately 8 weeks but patient advises that she prefers to hold off on scheduling appointment at this time and if she has symptoms she will call for an appointment. Therefore, no follow-up scheduled at this time and she can follow up with her PCP and return to gyn-onc clinic as needed.   We also discussed general [facility] recommendations for identifying COVID-19 risk and what to do if low risk such as handwashing, social distancing, coughing or sneezing into shirt or elbow, and staying at home. Advised patient that if she develops fever, cough, shortness of breath, to contact their healthcare provider before going to clinic and as always contact 911 for medical emergencies.

## 2018-10-18 ENCOUNTER — Inpatient Hospital Stay: Payer: Medicare PPO

## 2019-01-24 ENCOUNTER — Other Ambulatory Visit: Payer: Self-pay | Admitting: Family Medicine

## 2019-01-24 ENCOUNTER — Encounter: Payer: Self-pay | Admitting: Family Medicine

## 2019-01-24 ENCOUNTER — Ambulatory Visit (INDEPENDENT_AMBULATORY_CARE_PROVIDER_SITE_OTHER): Payer: Medicare PPO | Admitting: Family Medicine

## 2019-01-24 ENCOUNTER — Other Ambulatory Visit: Payer: Self-pay

## 2019-01-24 VITALS — BP 126/65 | HR 76

## 2019-01-24 DIAGNOSIS — F5101 Primary insomnia: Secondary | ICD-10-CM

## 2019-01-24 DIAGNOSIS — I10 Essential (primary) hypertension: Secondary | ICD-10-CM | POA: Diagnosis not present

## 2019-01-24 DIAGNOSIS — R7309 Other abnormal glucose: Secondary | ICD-10-CM

## 2019-01-24 DIAGNOSIS — E782 Mixed hyperlipidemia: Secondary | ICD-10-CM

## 2019-01-24 DIAGNOSIS — Z0000 Encounter for general adult medical examination without abnormal findings: Secondary | ICD-10-CM

## 2019-01-24 NOTE — Progress Notes (Signed)
Virtual Visit via Telephone The purpose of this virtual visit is to provide medical care while limiting exposure to the novel coronavirus (COVID19) for both patient and office staff.  Consent was obtained for phone visit:  Yes.   Answered questions that patient had about telehealth interaction:  Yes.   I discussed the limitations, risks, security and privacy concerns of performing an evaluation and management service by telephone. I also discussed with the patient that there may be a patient responsible charge related to this service. The patient expressed understanding and agreed to proceed.  Patient Location: Home Provider Location: [facility])  ---------------------------------------------------------------------- Chief Complaint  Patient presents with  . Hypertension    averaging 126/131-65/68    S: Reviewed CMA documentation. I have called patient and gathered additional HPI as follows:  Elevated A1c Previous A1c 5.7 to 5.9, now most recent 07/2018 down to 5.3, with improvement Lab with controlled LDL 81. Normal HDL 51. Elevated TG 191. CBGs: Not checking Meds: None Currently on ACEi, Statin simvastatin Lifestyle: - Diet (balanced diet, reduced appetite in morning)  - Exercise (regular exercise) Denies hypoglycemia  CHRONIC HTN: Reportsno new concerns, thinks her BP is doing well. She has home log avg 120-130 / 60-70s Current Meds -Amlodipine 5mg  daily, HCTZ 25mg  daily, Lisinopril 40mg  daily Reports good compliance, took meds today. Tolerating well, w/o complaints. Denies CP, dyspnea, HA, edema, dizziness / lightheadedness  Insomnia, Chronic Continues on Trazodone 100mg  nightly, doing well, helps her initiate sleep and helps maintain and fall back asleep. Still wakes up overnight to void / urinate as per usual. No new concerns.  Denies any high risk travel to areas of current concern for COVID19. Denies any known or suspected exposure to  person with or possibly with COVID19.  Denies any fevers, chills, sweats, body ache, cough, shortness of breath, sinus pain or pressure, headache, abdominal pain, diarrhea  Past Medical History:  Diagnosis Date  . Arthritis   . Bleeding from the urethra   . Essential hypertension, benign   . HTN (hypertension)   . Insomnia   . Lesion of urethra   . Mixed hyperlipidemia   . Osteoarthritis   . Paget's disease of vulva    Social History   Tobacco Use  . Smoking status: Never Smoker  . Smokeless tobacco: Never Used  Substance Use Topics  . Alcohol use: No  . Drug use: No    Current Outpatient Medications:  .  amLODipine (NORVASC) 5 MG tablet, Take 1 tablet (5 mg total) by mouth daily., Disp: 90 tablet, Rfl: 3 .  hydrochlorothiazide (HYDRODIURIL) 25 MG tablet, Take 1 tablet (25 mg total) by mouth daily., Disp: 90 tablet, Rfl: 3 .  lisinopril (PRINIVIL,ZESTRIL) 40 MG tablet, Take 1 tablet (40 mg total) by mouth daily., Disp: 90 tablet, Rfl: 3 .  simvastatin (ZOCOR) 40 MG tablet, Take 1 tablet (40 mg total) by mouth daily., Disp: 90 tablet, Rfl: 3 .  traZODone (DESYREL) 100 MG tablet, Take 1 tablet (100 mg total) by mouth at bedtime., Disp: 90 tablet, Rfl: 3  Depression screen [facility] 2/9 08/23/2018 01/18/2018 06/29/2017  Decreased Interest 0 0 0  Down, Depressed, Hopeless 0 0 0  PHQ - 2 Score 0 0 0    No flowsheet data found.  -------------------------------------------------------------------------- O: No physical exam performed due to remote telephone encounter.  Lab results reviewed.  No results found for this or any previous visit (from the past 2160 hour(s)).  -------------------------------------------------------------------------- A&P:  Problem List  Items Addressed This Visit    Elevated hemoglobin A1c - Primary    Well-controlled now with last A1c 5.3, improved, previously PreDM 5.7 to 5.9 Concern with HTN, HLD  Plan:  1. Not on any therapy currently - remain off  therapy 2. Encourage improved lifestyle - low carb, low sugar diet, reduce portion size, continue improving regular exercise  A1c 12 months now      Essential hypertension, benign    Well controlled HTN Home readings normal Without known complication. No known CAD/MI Off BB Metoprolol  Plan:  1. Continue current BP regimen Amlodipine 5mg , HCTZ 25mg , Lisinopril 40mg  - already has refills 2. Encourage improved lifestyle - low sodium diet, regular exercise 3. Continue monitor BP outside office, bring readings to next visit, if persistently >140/90 or new symptoms notify office sooner 4. Follow-up 6 months - annual      Insomnia    Improved chronic insomnia on trazodone - No history of Depression or Anxiety endorsed  Plan: 1. Continue current dose Trazodone 100mg  nightly         No orders of the defined types were placed in this encounter.   Follow-up: - Return in 6-7 months for Annual Physical - Future labs ordered for 08/22/19  Patient verbalizes understanding with the above medical recommendations including the limitation of remote medical advice.  Specific follow-up and call-back criteria were given for patient to follow-up or seek medical care more urgently if needed.   - Time spent in direct consultation with patient on phone: 12 minutes   Nobie Putnam, [facility] 01/24/2019, 10:51 AM

## 2019-01-24 NOTE — Assessment & Plan Note (Signed)
Well controlled HTN Home readings normal Without known complication. No known CAD/MI Off BB Metoprolol  Plan:  1. Continue current BP regimen Amlodipine 5mg , HCTZ 25mg , Lisinopril 40mg  - already has refills 2. Encourage improved lifestyle - low sodium diet, regular exercise 3. Continue monitor BP outside office, bring readings to next visit, if persistently >140/90 or new symptoms notify office sooner 4. Follow-up 6 months - annual

## 2019-01-24 NOTE — Patient Instructions (Signed)
AVS info given by phone. 

## 2019-01-24 NOTE — Assessment & Plan Note (Signed)
Improved chronic insomnia on trazodone - No history of Depression or Anxiety endorsed  Plan: 1. Continue current dose Trazodone 100mg  nightly

## 2019-01-24 NOTE — Assessment & Plan Note (Signed)
Well-controlled now with last A1c 5.3, improved, previously PreDM 5.7 to 5.9 Concern with HTN, HLD  Plan:  1. Not on any therapy currently - remain off therapy 2. Encourage improved lifestyle - low carb, low sugar diet, reduce portion size, continue improving regular exercise  A1c 12 months now

## 2019-03-02 DIAGNOSIS — M170 Bilateral primary osteoarthritis of knee: Secondary | ICD-10-CM | POA: Diagnosis not present

## 2019-06-13 DIAGNOSIS — M170 Bilateral primary osteoarthritis of knee: Secondary | ICD-10-CM | POA: Diagnosis not present

## 2019-06-13 DIAGNOSIS — M25462 Effusion, left knee: Secondary | ICD-10-CM | POA: Diagnosis not present

## 2019-06-13 DIAGNOSIS — M25561 Pain in right knee: Secondary | ICD-10-CM | POA: Diagnosis not present

## 2019-06-13 DIAGNOSIS — M25562 Pain in left knee: Secondary | ICD-10-CM | POA: Diagnosis not present

## 2019-06-13 DIAGNOSIS — M25461 Effusion, right knee: Secondary | ICD-10-CM | POA: Diagnosis not present

## 2019-06-13 DIAGNOSIS — G8929 Other chronic pain: Secondary | ICD-10-CM | POA: Diagnosis not present

## 2019-06-28 DIAGNOSIS — H43812 Vitreous degeneration, left eye: Secondary | ICD-10-CM | POA: Diagnosis not present

## 2019-06-28 DIAGNOSIS — H2513 Age-related nuclear cataract, bilateral: Secondary | ICD-10-CM | POA: Diagnosis not present

## 2019-07-02 ENCOUNTER — Telehealth: Payer: Self-pay | Admitting: Family Medicine

## 2019-07-02 ENCOUNTER — Other Ambulatory Visit: Payer: Self-pay

## 2019-07-02 DIAGNOSIS — F5101 Primary insomnia: Secondary | ICD-10-CM

## 2019-07-02 DIAGNOSIS — E782 Mixed hyperlipidemia: Secondary | ICD-10-CM

## 2019-07-02 DIAGNOSIS — I10 Essential (primary) hypertension: Secondary | ICD-10-CM

## 2019-07-02 MED ORDER — TRAZODONE HCL 100 MG PO TABS: 100.0000 mg | ORAL_TABLET | Freq: Every day | ORAL | 3 refills | Status: DC

## 2019-07-02 MED ORDER — AMLODIPINE BESYLATE 5 MG PO TABS: 5.0000 mg | ORAL_TABLET | Freq: Every day | ORAL | 3 refills | Status: DC

## 2019-07-02 MED ORDER — SIMVASTATIN 40 MG PO TABS: 40.0000 mg | ORAL_TABLET | Freq: Every day | ORAL | 3 refills | Status: DC

## 2019-07-02 MED ORDER — LISINOPRIL 40 MG PO TABS: 40.0000 mg | ORAL_TABLET | Freq: Every day | ORAL | 3 refills | Status: DC

## 2019-07-02 MED ORDER — HYDROCHLOROTHIAZIDE 25 MG PO TABS: 25.0000 mg | ORAL_TABLET | Freq: Every day | ORAL | 3 refills | Status: DC

## 2019-07-02 NOTE — Telephone Encounter (Signed)
Pt  Husband stopped by requesting refill on  ALL pt  med's ordered thru  Mail order

## 2019-07-02 NOTE — Telephone Encounter (Signed)
Refills all ordered.  Nobie Putnam, DO [facility] 07/02/2019, 3:08 PM

## 2019-08-22 ENCOUNTER — Other Ambulatory Visit: Payer: Medicare PPO

## 2019-08-22 ENCOUNTER — Other Ambulatory Visit: Payer: Self-pay

## 2019-08-22 DIAGNOSIS — R7309 Other abnormal glucose: Secondary | ICD-10-CM

## 2019-08-22 DIAGNOSIS — I10 Essential (primary) hypertension: Secondary | ICD-10-CM

## 2019-08-22 DIAGNOSIS — E782 Mixed hyperlipidemia: Secondary | ICD-10-CM

## 2019-08-22 DIAGNOSIS — Z0000 Encounter for general adult medical examination without abnormal findings: Secondary | ICD-10-CM | POA: Diagnosis not present

## 2019-08-23 LAB — CBC WITH DIFFERENTIAL/PLATELET
Absolute Monocytes: 697 cells/uL (ref 200–950)
Basophils Absolute: 69 cells/uL (ref 0–200)
Basophils Relative: 1 %
Eosinophils Absolute: 207 cells/uL (ref 15–500)
Eosinophils Relative: 3 %
HCT: 43.2 % (ref 35.0–45.0)
Hemoglobin: 14.6 g/dL (ref 11.7–15.5)
Lymphs Abs: 2422 cells/uL (ref 850–3900)
MCH: 31.1 pg (ref 27.0–33.0)
MCHC: 33.8 g/dL (ref 32.0–36.0)
MCV: 91.9 fL (ref 80.0–100.0)
MPV: 10.2 fL (ref 7.5–12.5)
Monocytes Relative: 10.1 %
Neutro Abs: 3505 cells/uL (ref 1500–7800)
Neutrophils Relative %: 50.8 %
Platelets: 284 10*3/uL (ref 140–400)
RBC: 4.7 10*6/uL (ref 3.80–5.10)
RDW: 12.1 % (ref 11.0–15.0)
Total Lymphocyte: 35.1 %
WBC: 6.9 10*3/uL (ref 3.8–10.8)

## 2019-08-23 LAB — COMPLETE METABOLIC PANEL WITH GFR
AG Ratio: 2.1 (calc) (ref 1.0–2.5)
ALT: 16 U/L (ref 6–29)
AST: 16 U/L (ref 10–35)
Albumin: 4.7 g/dL (ref 3.6–5.1)
Alkaline phosphatase (APISO): 53 U/L (ref 37–153)
BUN: 14 mg/dL (ref 7–25)
CO2: 31 mmol/L (ref 20–32)
Calcium: 10.1 mg/dL (ref 8.6–10.4)
Chloride: 102 mmol/L (ref 98–110)
Creat: 0.85 mg/dL (ref 0.60–0.88)
GFR, Est African American: 73 mL/min/{1.73_m2} (ref >=60)
GFR, Est Non African American: 63 mL/min/{1.73_m2} (ref >=60)
Globulin: 2.2 g/dL (calc) (ref 1.9–3.7)
Glucose, Bld: 98 mg/dL (ref 65–99)
Potassium: 4.3 mmol/L (ref 3.5–5.3)
Sodium: 142 mmol/L (ref 135–146)
Total Bilirubin: 0.9 mg/dL (ref 0.2–1.2)
Total Protein: 6.9 g/dL (ref 6.1–8.1)

## 2019-08-23 LAB — LIPID PANEL
Cholesterol: 142 mg/dL (ref <200)
HDL: 49 mg/dL — ABNORMAL LOW (ref >=50)
LDL Cholesterol (Calc): 70 mg/dL (calc)
Non-HDL Cholesterol (Calc): 93 mg/dL (calc) (ref <130)
Total CHOL/HDL Ratio: 2.9 (calc) (ref <5.0)
Triglycerides: 152 mg/dL — ABNORMAL HIGH (ref <150)

## 2019-08-23 LAB — HEMOGLOBIN A1C
Hgb A1c MFr Bld: 5.5 % of total Hgb (ref <5.7)
Mean Plasma Glucose: 111 (calc)
eAG (mmol/L): 6.2 (calc)

## 2019-08-23 LAB — TSH: TSH: 2.7 mIU/L (ref 0.40–4.50)

## 2019-08-29 ENCOUNTER — Other Ambulatory Visit: Payer: Self-pay

## 2019-08-29 ENCOUNTER — Encounter: Payer: Self-pay | Admitting: Family Medicine

## 2019-08-29 ENCOUNTER — Ambulatory Visit (INDEPENDENT_AMBULATORY_CARE_PROVIDER_SITE_OTHER): Payer: Medicare PPO | Admitting: Family Medicine

## 2019-08-29 VITALS — BP 132/60 | HR 82 | Temp 97.8°F | Ht 65.0 in | Wt 157.8 lb

## 2019-08-29 DIAGNOSIS — F5101 Primary insomnia: Secondary | ICD-10-CM

## 2019-08-29 DIAGNOSIS — M19041 Primary osteoarthritis, right hand: Secondary | ICD-10-CM | POA: Diagnosis not present

## 2019-08-29 DIAGNOSIS — E782 Mixed hyperlipidemia: Secondary | ICD-10-CM

## 2019-08-29 DIAGNOSIS — M170 Bilateral primary osteoarthritis of knee: Secondary | ICD-10-CM | POA: Diagnosis not present

## 2019-08-29 DIAGNOSIS — Z0000 Encounter for general adult medical examination without abnormal findings: Secondary | ICD-10-CM | POA: Diagnosis not present

## 2019-08-29 DIAGNOSIS — I10 Essential (primary) hypertension: Secondary | ICD-10-CM

## 2019-08-29 DIAGNOSIS — R7309 Other abnormal glucose: Secondary | ICD-10-CM | POA: Diagnosis not present

## 2019-08-29 MED ORDER — DICLOFENAC SODIUM 1 % EX GEL: 2.0000 g | Freq: Three times a day (TID) | CUTANEOUS | 2 refills | Status: DC | PRN

## 2019-08-29 NOTE — Progress Notes (Signed)
Subjective:    Patient ID: Carla Sparks, female    DOB: Sep 11, 1933, 87 y.o.   MRN: [id number]  Carla Sparks is a 86 y.o. female presenting on 08/29/2019 for Annual Exam (intermittent pain in the right middle finger, that worsen with touch x 3 weeks. Pt want to discuss possibly taking diclofenac. )   HPI  Here for Annual Physical and lab Review.  Elevated A1c Previous A1c 5.3 to 5.9, now most recent 07/2019 result is 5.5 - improved diet Lab with controlled LDL 81. Normal HDL 51. Elevated TG 191. CBGs:Not checking Meds:None Currently on ACEi, Statin simvastatin Lifestyle: - Diet (balanced diet, reduced appetite in morning)  - Exercise (regular exercise) Denies hypoglycemia  CHRONIC HTN: Reportsno new concerns. Home BP log is normal. Current Meds -Amlodipine 5mg  daily, HCTZ 25mg  daily, Lisinopril 40mg  daily Reports good compliance, took meds today. Tolerating well, w/o complaints. Denies CP, dyspnea, HA, edema, dizziness / lightheadedness  Insomnia, Chronic Continues on Trazodone 100mg  nightly, doing well, helps her initiate sleep and helps maintain and fall back asleep. No new concerns.  HYPERLIPIDEMIA: - Reports no concerns. Last lipid panel 07/2019, mostly controlled mild elevated TG but overall much improved 191 down to 152, and HDL is 49. LDL improved 81 to 70 - Currently taking Simvastatin 40mg , Flax Seed Oil, tolerating well without side effects or myalgias  Osteoarthritis, multiple joints / knees bilateral Followed by [facility] Dr Roland Rack and Dr Candelaria Stagers, has had prior recurrent steroid cortisone knee injections, now limited relief, she wants to avoid surgery, she has chronic bilateral knee pain stiffness and swelling moderate with arthritis, affects her if increased activity at times or if rest can get more stiff. She also now has new issue flare up R middle finger pain arthritis pain - She is asking about next steps with x-ray imaging and Ortho  offered visco injection series, vs she is asking about QC Kinetics therapy?   Health Maintenance: Did not get flu vaccine this season. Asking about COVID19 vaccine - recommended to get it, and given information on how to sign up as she is currently eligible.  Depression screen [facility] 2/9 08/29/2019 08/23/2018 01/18/2018  Decreased Interest 0 0 0  Down, Depressed, Hopeless 0 0 0  PHQ - 2 Score 0 0 0    Past Medical History:  Diagnosis Date  . Arthritis   . Bleeding from the urethra   . Essential hypertension, benign   . HTN (hypertension)   . Insomnia   . Lesion of urethra   . Mixed hyperlipidemia   . Osteoarthritis   . Paget's disease of vulva    Past Surgical History:  Procedure Laterality Date  . ABDOMINAL HYSTERECTOMY    . CHOLECYSTECTOMY    . External hemorrhoid surgery    . GALLBLADDER SURGERY    . TONSILLECTOMY    . TOTAL ABDOMINAL HYSTERECTOMY W/ BILATERAL SALPINGOOPHORECTOMY    . VULVECTOMY     Social History   Socioeconomic History  . Marital status: Married    Spouse name: Not on file  . Number of children: Not on file  . Years of education: 62  . Highest education level: 12th grade  Occupational History  . Not on file  Tobacco Use  . Smoking status: Never Smoker  . Smokeless tobacco: Never Used  Substance and Sexual Activity  . Alcohol use: No  . Drug use: No  . Sexual activity: Not on file  Other Topics Concern  . Not on file  Social History Narrative  . Not on file   Social Determinants of Health   Financial Resource Strain:   . Difficulty of Paying Living Expenses: Not on file  Food Insecurity:   . Worried About Charity fundraiser in the Last Year: Not on file  . Ran Out of Food in the Last Year: Not on file  Transportation Needs:   . Lack of Transportation (Medical): Not on file  . Lack of Transportation (Non-Medical): Not on file  Physical Activity:   . Days of Exercise per Week: Not on file  . Minutes of Exercise per Session: Not on file   Stress:   . Feeling of Stress : Not on file  Social Connections:   . Frequency of Communication with Friends and Family: Not on file  . Frequency of Social Gatherings with Friends and Family: Not on file  . Attends Religious Services: Not on file  . Active Member of Clubs or Organizations: Not on file  . Attends Archivist Meetings: Not on file  . Marital Status: Not on file  Intimate Partner Violence:   . Fear of Current or Ex-Partner: Not on file  . Emotionally Abused: Not on file  . Physically Abused: Not on file  . Sexually Abused: Not on file   Family History  Problem Relation Age of Onset  . Heart disease Father        Diagnosed age 75  . Kidney cancer Father   . Heart failure Mother   . Cancer Brother   . Alzheimer's disease Maternal Aunt   . Alzheimer's disease Maternal Uncle   . Breast cancer Neg Hx    Current Outpatient Medications on File Prior to Visit  Medication Sig  . amLODipine (NORVASC) 5 MG tablet Take 1 tablet (5 mg total) by mouth daily.  . Cholecalciferol (VITAMIN D3) 125 MCG (5000 UT) CAPS Take by mouth.  . Flaxseed, Linseed, (FLAX SEED OIL) 1000 MG CAPS Take by mouth.  . hydrochlorothiazide (HYDRODIURIL) 25 MG tablet Take 1 tablet (25 mg total) by mouth daily.  Marland Kitchen lisinopril (ZESTRIL) 40 MG tablet Take 1 tablet (40 mg total) by mouth daily.  . Multiple Vitamin (MULTIVITAMIN) tablet Take 1 tablet by mouth daily.  . simvastatin (ZOCOR) 40 MG tablet Take 1 tablet (40 mg total) by mouth daily.  . traZODone (DESYREL) 100 MG tablet Take 1 tablet (100 mg total) by mouth at bedtime.   No current facility-administered medications on file prior to visit.    Review of Systems  Constitutional: Negative for activity change, appetite change, chills, diaphoresis, fatigue and fever.  HENT: Negative for congestion and hearing loss.   Eyes: Negative for visual disturbance.  Respiratory: Negative for apnea, cough, chest tightness, shortness of breath and  wheezing.   Cardiovascular: Negative for chest pain, palpitations and leg swelling.  Gastrointestinal: Negative for abdominal pain, constipation, diarrhea, nausea and vomiting.  Endocrine: Negative for cold intolerance.  Genitourinary: Negative for difficulty urinating, dysuria, frequency and hematuria.  Musculoskeletal: Positive for arthralgias and joint swelling. Negative for neck pain.  Skin: Negative for rash.  Allergic/Immunologic: Negative for environmental allergies.  Neurological: Negative for dizziness, weakness, light-headedness, numbness and headaches.  Hematological: Negative for adenopathy.  Psychiatric/Behavioral: Negative for behavioral problems, dysphoric mood and sleep disturbance. The patient is not nervous/anxious.    Per HPI unless specifically indicated above      Objective:    BP 132/60 (BP Location: Left Arm, Patient Position: Sitting, Cuff Size: Normal)  Pulse 82   Temp 97.8 F (36.6 C) (Oral)   Ht 5\' 5"  (1.651 m)   Wt 157 lb 12.8 oz (71.6 kg)   BMI 26.26 kg/m   Wt Readings from Last 3 Encounters:  08/29/19 157 lb 12.8 oz (71.6 kg)  08/23/18 159 lb 3.2 oz (72.2 kg)  01/18/18 162 lb (73.5 kg)    Physical Exam Vitals and nursing note reviewed.  Constitutional:      General: She is not in acute distress.    Appearance: She is well-developed. She is not diaphoretic.     Comments: Well-appearing, comfortable, cooperative  HENT:     Head: Normocephalic and atraumatic.  Eyes:     General:        Right eye: No discharge.        Left eye: No discharge.     Conjunctiva/sclera: Conjunctivae normal.     Pupils: Pupils are equal, round, and reactive to light.  Neck:     Thyroid: No thyromegaly.  Cardiovascular:     Rate and Rhythm: Normal rate and regular rhythm.     Heart sounds: Normal heart sounds. No murmur.  Pulmonary:     Effort: Pulmonary effort is normal. No respiratory distress.     Breath sounds: Normal breath sounds. No wheezing or rales.   Abdominal:     General: Bowel sounds are normal. There is no distension.     Palpations: Abdomen is soft. There is no mass.     Tenderness: There is no abdominal tenderness.  Musculoskeletal:        General: No tenderness. Normal range of motion.     Cervical back: Normal range of motion and neck supple.     Comments: Upper / Lower Extremities: - Normal muscle tone, strength bilateral upper extremities 5/5, lower extremities 5/5  Bilateral knees mild bulky appearance, no significant effusion R>L some mild medial swelling, general mild tender joint line bilateral.  R middle finger, some stiffness on ROM, tender generalized with grip provoked, otherwise no deformity no significant bulkiness or swelling or erythema or ecchymosis   Lymphadenopathy:     Cervical: No cervical adenopathy.  Skin:    General: Skin is warm and dry.     Findings: No erythema or rash.  Neurological:     Mental Status: She is alert and oriented to person, place, and time.     Comments: Distal sensation intact to light touch all extremities  Psychiatric:        Behavior: Behavior normal.     Comments: Well groomed, good eye contact, normal speech and thoughts      Results for orders placed or performed in visit on 08/22/19  TSH  Result Value Ref Range   TSH 2.70 0.40 - 4.50 mIU/L  Lipid panel  Result Value Ref Range   Cholesterol 142 <200 mg/dL   HDL 49 (L) > OR = 50 mg/dL   Triglycerides 152 (H) <150 mg/dL   LDL Cholesterol (Calc) 70 mg/dL (calc)   Total CHOL/HDL Ratio 2.9 <5.0 (calc)   Non-HDL Cholesterol (Calc) 93 <130 mg/dL (calc)  COMPLETE METABOLIC PANEL WITH GFR  Result Value Ref Range   Glucose, Bld 98 65 - 99 mg/dL   BUN 14 7 - 25 mg/dL   Creat 0.85 0.60 - 0.88 mg/dL   GFR, Est Non African American 63 > OR = 60 mL/min/1.32m2   GFR, Est African American 73 > OR = 60 mL/min/1.47m2   BUN/Creatinine Ratio NOT APPLICABLE 6 -  22 (calc)   Sodium 142 135 - 146 mmol/L   Potassium 4.3 3.5 - 5.3  mmol/L   Chloride 102 98 - 110 mmol/L   CO2 31 20 - 32 mmol/L   Calcium 10.1 8.6 - 10.4 mg/dL   Total Protein 6.9 6.1 - 8.1 g/dL   Albumin 4.7 3.6 - 5.1 g/dL   Globulin 2.2 1.9 - 3.7 g/dL (calc)   AG Ratio 2.1 1.0 - 2.5 (calc)   Total Bilirubin 0.9 0.2 - 1.2 mg/dL   Alkaline phosphatase (APISO) 53 37 - 153 U/L   AST 16 10 - 35 U/L   ALT 16 6 - 29 U/L  CBC with Differential/Platelet  Result Value Ref Range   WBC 6.9 3.8 - 10.8 Thousand/uL   RBC 4.70 3.80 - 5.10 Million/uL   Hemoglobin 14.6 11.7 - 15.5 g/dL   HCT 43.2 35.0 - 45.0 %   MCV 91.9 80.0 - 100.0 fL   MCH 31.1 27.0 - 33.0 pg   MCHC 33.8 32.0 - 36.0 g/dL   RDW 12.1 11.0 - 15.0 %   Platelets 284 140 - 400 Thousand/uL   MPV 10.2 7.5 - 12.5 fL   Neutro Abs 3,505 1,500 - 7,800 cells/uL   Lymphs Abs 2,422 850 - 3,900 cells/uL   Absolute Monocytes 697 200 - 950 cells/uL   Eosinophils Absolute 207 15 - 500 cells/uL   Basophils Absolute 69 0 - 200 cells/uL   Neutrophils Relative % 50.8 %   Total Lymphocyte 35.1 %   Monocytes Relative 10.1 %   Eosinophils Relative 3.0 %   Basophils Relative 1.0 %  Hemoglobin A1c  Result Value Ref Range   Hgb A1c MFr Bld 5.5 <5.7 % of total Hgb   Mean Plasma Glucose 111 (calc)   eAG (mmol/L) 6.2 (calc)      Assessment & Plan:   Problem List Items Addressed This Visit    Primary osteoarthritis of both knees   Relevant Medications   diclofenac Sodium (VOLTAREN) 1 % GEL   Mixed hyperlipidemia    Controlled cholesterol on statin and improved lifestyle Last lipid panel 07/2019  Plan: 1. Continue current meds - Simvastatin 40mg , Flaxseed oil 2. Encourage improved lifestyle - low carb/cholesterol, reduce portion size, continue improving regular exercise      Insomnia    Improved chronic insomnia on trazodone - No history of Depression or Anxiety endorsed  Plan: 1. Continue current dose Trazodone 100mg  nightly      Essential hypertension, benign    Well controlled HTN Home  readings normal Without known complication. No known CAD/MI Off BB Metoprolol  Plan:  1. Continue current BP regimen Amlodipine 5mg , HCTZ 25mg , Lisinopril 40mg  2. Encourage improved lifestyle - low sodium diet, regular exercise 3. Continue monitor BP outside office, bring readings to next visit, if persistently >140/90 or new symptoms notify office sooner 4. Follow-up 6 months      Elevated hemoglobin A1c    Controlled with A1c 5.5, below range PreDM had higher sugar in past now controlled Concern with HTN, HLD  Plan:  1. Not on any therapy currently - remain off therapy 2. Encourage improved lifestyle - low carb, low sugar diet, reduce portion size, continue improving regular exercise  Repeat A1c q 12 months still for now      Arthritis of finger of right hand   Relevant Medications   diclofenac Sodium (VOLTAREN) 1 % GEL    Other Visit Diagnoses    Annual physical exam    -  Primary      Updated Health Maintenance information Reviewed recent lab results with patient Encouraged improvement to lifestyle with diet and exercise Goal maintain healthy weight  #Arthritis multiple joints, bilateral knees / R hand/finger Chronic problem, known multiple joint osteoarthritis, with recent flare R middle finger w/o injury, consistent with stiffness pain with arthritis in hand on exam Followed by [facility] Dr Candelaria Stagers Dr Roland Rack S/p steroid knee injection series in past, limited improvement On conservative therapy Now will start Diclofenac topical gel TID PRN for knee/hand arthritis She can return to [facility] ortho to discuss visco injections and x-rays, I advised her that is my recommended route, compared to the QC Kinetic option for now and she can discuss further with ortho   Meds ordered this encounter  Medications  . diclofenac Sodium (VOLTAREN) 1 % GEL    Sig: Apply 2 g topically 3 (three) times daily as needed. For knee and hand arthritis pain    Dispense:  100 g    Refill:  2       Follow up plan: Return in about 6 months (around 02/26/2020) for 6 month follow-up arthritis.  Nobie Putnam, DO [facility] 08/29/2019, 10:45 AM

## 2019-08-29 NOTE — Patient Instructions (Addendum)
Thank you for coming to the office today.  Diclofenac topical gel/cream use as needed on finger/hand and knees if you need.  Can use [email or web address]  1. Chemistry - Normal results, including electrolytes, kidney and liver function. Normal fasting blood sugar   2. Hemoglobin A1c (Diabetes screening) - 5.5, normal not in range of Pre-Diabetes (>5.7 to 6.4)   3. TSH Thyroid Function Tests - Normal.  4. Cholesterol - Significantly improved cholesterol, now nearly normal range. Triglycerides down from 191 down to 152, goal is < 150. LDL down to 70, controlled on SImvastatin 40mg .  5. CBC Blood Counts - Normal, no anemia, other abnormality  Please schedule a Follow-up Appointment to: Return in about 6 months (around 02/26/2020) for 6 month follow-up arthritis.  If you have any other questions or concerns, please feel free to call the office or send a message through [place]. You may also schedule an earlier appointment if necessary.  Additionally, you may be receiving a survey about your experience at our office within a few days to 1 week by e-mail or mail. We value your feedback.  Nobie Putnam, DO [place]

## 2019-08-29 NOTE — Assessment & Plan Note (Signed)
Well controlled HTN Home readings normal Without known complication. No known CAD/MI Off BB Metoprolol  Plan:  1. Continue current BP regimen Amlodipine 5mg , HCTZ 25mg , Lisinopril 40mg  2. Encourage improved lifestyle - low sodium diet, regular exercise 3. Continue monitor BP outside office, bring readings to next visit, if persistently >140/90 or new symptoms notify office sooner 4. Follow-up 6 months

## 2019-08-29 NOTE — Assessment & Plan Note (Signed)
Controlled cholesterol on statin and improved lifestyle Last lipid panel 07/2019  Plan: 1. Continue current meds - Simvastatin 40mg , Flaxseed oil 2. Encourage improved lifestyle - low carb/cholesterol, reduce portion size, continue improving regular exercise

## 2019-08-29 NOTE — Assessment & Plan Note (Signed)
Controlled with A1c 5.5, below range PreDM had higher sugar in past now controlled Concern with HTN, HLD  Plan:  1. Not on any therapy currently - remain off therapy 2. Encourage improved lifestyle - low carb, low sugar diet, reduce portion size, continue improving regular exercise  Repeat A1c q 12 months still for now

## 2019-08-29 NOTE — Assessment & Plan Note (Signed)
Improved chronic insomnia on trazodone - No history of Depression or Anxiety endorsed  Plan: 1. Continue current dose Trazodone 100mg  nightly

## 2019-09-17 DIAGNOSIS — M25561 Pain in right knee: Secondary | ICD-10-CM | POA: Diagnosis not present

## 2019-09-17 DIAGNOSIS — M25462 Effusion, left knee: Secondary | ICD-10-CM | POA: Diagnosis not present

## 2019-09-17 DIAGNOSIS — G8929 Other chronic pain: Secondary | ICD-10-CM | POA: Diagnosis not present

## 2019-09-17 DIAGNOSIS — M170 Bilateral primary osteoarthritis of knee: Secondary | ICD-10-CM | POA: Diagnosis not present

## 2019-09-17 DIAGNOSIS — M25562 Pain in left knee: Secondary | ICD-10-CM | POA: Diagnosis not present

## 2019-09-17 DIAGNOSIS — M25461 Effusion, right knee: Secondary | ICD-10-CM | POA: Diagnosis not present

## 2019-09-21 DIAGNOSIS — G8929 Other chronic pain: Secondary | ICD-10-CM | POA: Diagnosis not present

## 2019-09-21 DIAGNOSIS — M170 Bilateral primary osteoarthritis of knee: Secondary | ICD-10-CM | POA: Diagnosis not present

## 2019-09-21 DIAGNOSIS — M25462 Effusion, left knee: Secondary | ICD-10-CM | POA: Diagnosis not present

## 2019-09-21 DIAGNOSIS — M25562 Pain in left knee: Secondary | ICD-10-CM | POA: Diagnosis not present

## 2019-09-21 DIAGNOSIS — M25561 Pain in right knee: Secondary | ICD-10-CM | POA: Diagnosis not present

## 2019-09-21 DIAGNOSIS — M25461 Effusion, right knee: Secondary | ICD-10-CM | POA: Diagnosis not present

## 2019-09-23 ENCOUNTER — Emergency Department (HOSPITAL_COMMUNITY): Payer: Medicare PPO

## 2019-09-23 ENCOUNTER — Encounter (HOSPITAL_COMMUNITY): Payer: Self-pay | Admitting: Obstetrics and Gynecology

## 2019-09-23 ENCOUNTER — Emergency Department (HOSPITAL_COMMUNITY)
Admission: EM | Admit: 2019-09-23 | Discharge: 2019-09-23 | Disposition: A | Discharge status: Home / Self Care | Payer: Medicare PPO | Attending: Emergency Medicine | Admitting: Emergency Medicine

## 2019-09-23 DIAGNOSIS — Z23 Encounter for immunization: Secondary | ICD-10-CM | POA: Diagnosis not present

## 2019-09-23 DIAGNOSIS — S43004A Unspecified dislocation of right shoulder joint, initial encounter: Secondary | ICD-10-CM | POA: Diagnosis not present

## 2019-09-23 DIAGNOSIS — I959 Hypotension, unspecified: Secondary | ICD-10-CM | POA: Diagnosis not present

## 2019-09-23 DIAGNOSIS — S0181XA Laceration without foreign body of other part of head, initial encounter: Secondary | ICD-10-CM | POA: Diagnosis not present

## 2019-09-23 DIAGNOSIS — W19XXXA Unspecified fall, initial encounter: Secondary | ICD-10-CM | POA: Diagnosis not present

## 2019-09-23 DIAGNOSIS — Z79899 Other long term (current) drug therapy: Secondary | ICD-10-CM | POA: Diagnosis not present

## 2019-09-23 DIAGNOSIS — D329 Benign neoplasm of meninges, unspecified: Secondary | ICD-10-CM | POA: Diagnosis not present

## 2019-09-23 DIAGNOSIS — Y999 Unspecified external cause status: Secondary | ICD-10-CM | POA: Diagnosis not present

## 2019-09-23 DIAGNOSIS — I10 Essential (primary) hypertension: Secondary | ICD-10-CM | POA: Insufficient documentation

## 2019-09-23 DIAGNOSIS — S42251A Displaced fracture of greater tuberosity of right humerus, initial encounter for closed fracture: Secondary | ICD-10-CM

## 2019-09-23 DIAGNOSIS — S0990XA Unspecified injury of head, initial encounter: Secondary | ICD-10-CM

## 2019-09-23 DIAGNOSIS — S01412A Laceration without foreign body of left cheek and temporomandibular area, initial encounter: Secondary | ICD-10-CM | POA: Diagnosis not present

## 2019-09-23 DIAGNOSIS — Y92009 Unspecified place in unspecified non-institutional (private) residence as the place of occurrence of the external cause: Secondary | ICD-10-CM | POA: Diagnosis not present

## 2019-09-23 DIAGNOSIS — G44309 Post-traumatic headache, unspecified, not intractable: Secondary | ICD-10-CM | POA: Diagnosis not present

## 2019-09-23 DIAGNOSIS — S42211A Unspecified displaced fracture of surgical neck of right humerus, initial encounter for closed fracture: Secondary | ICD-10-CM | POA: Diagnosis not present

## 2019-09-23 DIAGNOSIS — S42214A Unspecified nondisplaced fracture of surgical neck of right humerus, initial encounter for closed fracture: Secondary | ICD-10-CM | POA: Diagnosis not present

## 2019-09-23 DIAGNOSIS — S4291XA Fracture of right shoulder girdle, part unspecified, initial encounter for closed fracture: Secondary | ICD-10-CM | POA: Diagnosis not present

## 2019-09-23 DIAGNOSIS — S0993XA Unspecified injury of face, initial encounter: Secondary | ICD-10-CM | POA: Diagnosis not present

## 2019-09-23 DIAGNOSIS — S43014A Anterior dislocation of right humerus, initial encounter: Secondary | ICD-10-CM | POA: Diagnosis not present

## 2019-09-23 DIAGNOSIS — S199XXA Unspecified injury of neck, initial encounter: Secondary | ICD-10-CM | POA: Diagnosis not present

## 2019-09-23 DIAGNOSIS — R000 Tachycardia, unspecified: Secondary | ICD-10-CM | POA: Diagnosis not present

## 2019-09-23 DIAGNOSIS — Z20822 Contact with and (suspected) exposure to covid-19: Secondary | ICD-10-CM | POA: Insufficient documentation

## 2019-09-23 DIAGNOSIS — Y9389 Activity, other specified: Secondary | ICD-10-CM | POA: Insufficient documentation

## 2019-09-23 DIAGNOSIS — W109XXA Fall (on) (from) unspecified stairs and steps, initial encounter: Secondary | ICD-10-CM | POA: Insufficient documentation

## 2019-09-23 DIAGNOSIS — R52 Pain, unspecified: Secondary | ICD-10-CM | POA: Diagnosis not present

## 2019-09-23 LAB — RESPIRATORY PANEL BY RT PCR (FLU A&B, COVID)
Influenza A by PCR: NEGATIVE
Influenza B by PCR: NEGATIVE
SARS Coronavirus 2 by RT PCR: NEGATIVE

## 2019-09-23 LAB — BASIC METABOLIC PANEL
Anion gap: 15 (ref 5–15)
BUN: 19 mg/dL (ref 8–23)
CO2: 23 mmol/L (ref 22–32)
Calcium: 10 mg/dL (ref 8.9–10.3)
Chloride: 103 mmol/L (ref 98–111)
Creatinine, Ser: 0.76 mg/dL (ref 0.44–1.00)
GFR calc Af Amer: >60 mL/min (ref >60)
GFR calc non Af Amer: >60 mL/min (ref >60)
Glucose, Bld: 134 mg/dL — ABNORMAL HIGH (ref 70–99)
Potassium: 3.4 mmol/L — ABNORMAL LOW (ref 3.5–5.1)
Sodium: 141 mmol/L (ref 135–145)

## 2019-09-23 LAB — CBC
HCT: 42.2 % (ref 36.0–46.0)
Hemoglobin: 14.3 g/dL (ref 12.0–15.0)
MCH: 31.2 pg (ref 26.0–34.0)
MCHC: 33.9 g/dL (ref 30.0–36.0)
MCV: 92.1 fL (ref 80.0–100.0)
Platelets: 225 10*3/uL (ref 150–400)
RBC: 4.58 MIL/uL (ref 3.87–5.11)
RDW: 12.6 % (ref 11.5–15.5)
WBC: 17.3 10*3/uL — ABNORMAL HIGH (ref 4.0–10.5)
nRBC: 0 % (ref 0.0–0.2)

## 2019-09-23 LAB — PROTIME-INR
INR: 1 (ref 0.8–1.2)
Prothrombin Time: 13.1 seconds (ref 11.4–15.2)

## 2019-09-23 MED ORDER — ONDANSETRON HCL 4 MG/2ML IJ SOLN
4.0000 mg | Freq: Once | INTRAMUSCULAR | Status: AC
  Administered 2019-09-23: 15:00:00 4 mg via INTRAVENOUS
  Filled 2019-09-23: qty 2

## 2019-09-23 MED ORDER — KETAMINE HCL 10 MG/ML IJ SOLN
1.0000 mg/kg | Freq: Once | INTRAMUSCULAR | Status: DC
  Filled 2019-09-23: qty 1

## 2019-09-23 MED ORDER — ONDANSETRON HCL 4 MG/2ML IJ SOLN
4.0000 mg | Freq: Once | INTRAMUSCULAR | Status: DC
  Filled 2019-09-23: qty 2

## 2019-09-23 MED ORDER — TETANUS-DIPHTH-ACELL PERTUSSIS 5-2.5-18.5 LF-MCG/0.5 IM SUSP
0.5000 mL | Freq: Once | INTRAMUSCULAR | Status: AC
  Administered 2019-09-23: 15:00:00 0.5 mL via INTRAMUSCULAR
  Filled 2019-09-23: qty 0.5

## 2019-09-23 MED ORDER — MORPHINE SULFATE (PF) 4 MG/ML IV SOLN
4.0000 mg | Freq: Once | INTRAVENOUS | Status: AC
  Administered 2019-09-23: 15:00:00 4 mg via INTRAVENOUS
  Filled 2019-09-23: qty 1

## 2019-09-23 MED ORDER — KETAMINE HCL 10 MG/ML IJ SOLN
INTRAMUSCULAR | Status: AC | PRN
  Administered 2019-09-23: 72.9 mg via INTRAVENOUS

## 2019-09-23 MED ORDER — ONDANSETRON HCL 4 MG/2ML IJ SOLN: 4.0000 mg | Freq: Once | INTRAMUSCULAR | Status: DC

## 2019-09-23 MED ORDER — IBUPROFEN 600 MG PO TABS: 600.0000 mg | ORAL_TABLET | Freq: Four times a day (QID) | ORAL | 0 refills | Status: DC | PRN

## 2019-09-23 MED ORDER — HYDROMORPHONE HCL 1 MG/ML IJ SOLN
1.0000 mg | Freq: Once | INTRAMUSCULAR | Status: AC
  Administered 2019-09-23: 15:00:00 1 mg via INTRAVENOUS
  Filled 2019-09-23: qty 1

## 2019-09-23 MED ORDER — ONDANSETRON HCL 4 MG/2ML IJ SOLN
INTRAMUSCULAR | Status: AC | PRN
  Administered 2019-09-23: 4 mg via INTRAVENOUS

## 2019-09-23 MED ORDER — OXYCODONE HCL 5 MG PO TABS: 5.0000 mg | ORAL_TABLET | Freq: Four times a day (QID) | ORAL | 0 refills | Status: DC | PRN

## 2019-09-23 NOTE — ED Provider Notes (Signed)
.  Sedation  Date/Time: 09/23/2019 6:26 PM Performed by: Ezequiel Essex, MD Authorized by: Ezequiel Essex, MD   Consent:    Consent obtained:  Verbal, written and emergent situation   Consent given by:  Patient   Risks discussed:  Allergic reaction, dysrhythmia, inadequate sedation, nausea, prolonged hypoxia resulting in organ damage, prolonged sedation necessitating reversal, respiratory compromise necessitating ventilatory assistance and intubation and vomiting   Alternatives discussed:  Analgesia without sedation, anxiolysis and regional anesthesia Universal protocol:    Procedure explained and questions answered to patient or proxy's satisfaction: yes     Relevant documents present and verified: yes     Test results available and properly labeled: yes     Imaging studies available: yes     Required blood products, implants, devices, and special equipment available: yes     Site/side marked: yes     Immediately prior to procedure a time out was called: yes     Patient identity confirmation method:  Verbally with patient and anonymous protocol, patient vented/unresponsive Indications:    Procedure performed:  Fracture reduction   Procedure necessitating sedation performed by:  Physician performing sedation Pre-sedation assessment:    Time since last food or drink:  6 hours   ASA classification: class 2 - patient with mild systemic disease     Neck mobility: normal     Mouth opening:  3 or more finger widths   Thyromental distance:  4 finger widths   Mallampati score:  I - soft palate, uvula, fauces, pillars visible   Pre-sedation assessments completed and reviewed: airway patency, cardiovascular function, hydration status, mental status, nausea/vomiting, pain level, respiratory function and temperature     Pre-sedation assessment completed:  09/23/2019 6:00 PM Immediate pre-procedure details:    Reassessment: Patient reassessed immediately prior to procedure     Reviewed: vital  signs, relevant labs/tests and NPO status     Verified: bag valve mask available, emergency equipment available, intubation equipment available, IV patency confirmed, oxygen available and suction available   Procedure details (see MAR for exact dosages):    Preoxygenation:  Nasal cannula   Sedation:  Ketamine   Intended level of sedation: deep   Analgesia:  Fentanyl   Intra-procedure monitoring:  Blood pressure monitoring, cardiac monitor, continuous pulse oximetry, frequent LOC assessments, frequent vital sign checks and continuous capnometry   Intra-procedure events: none     Intra-procedure management:  Supplemental oxygen   Total Provider sedation time (minutes):  15 Post-procedure details:    Post-sedation assessment completed:  09/23/2019 6:27 PM   Attendance: Constant attendance by certified staff until patient recovered     Recovery: Patient returned to pre-procedure baseline     Post-sedation assessments completed and reviewed: airway patency, cardiovascular function, hydration status, mental status, nausea/vomiting, pain level, respiratory function and temperature     Patient is stable for discharge or admission: yes     Patient tolerance:  Tolerated well, no immediate complications       Ezequiel Essex, MD 09/23/19 [facility]

## 2019-09-23 NOTE — ED Triage Notes (Signed)
Per EMS: Patient fell and hit her arm and face after missing a step. Patient has good peripheral pulses but reports she cannot move her arm and is having severe pain. Patient has her arm splinted currently and has had 100 of fentanyl.  Pt's VSS

## 2019-09-23 NOTE — ED Provider Notes (Signed)
[facility] DEPT Provider Note   CSN: [id number] Arrival date & time: 09/23/19  1428     History Chief Complaint  Patient presents with  . Fall  . Laceration    Carla Sparks is a 86 y.o. female.  Patient arrives via EMS from family members home, where by had mechanical fall. Indicates missed a step, and fell onto right arm. EMS noted right upper arm deformity, and contusion laceration to head/face. Last tetanus unknown. Symptoms acute onset, episodic. No faintness or dizziness prior to fall. No loc w fall, but dazed. Dull headache post hitting head. Other than head injury and shoulder pain, denies other pain or injury. No numbness/wekakness. Denies cp or sob. No abd pain or vomiting. No anticoag use.   The history is provided by the patient.  Fall Pertinent negatives include no chest pain, no abdominal pain and no shortness of breath.  Laceration Associated symptoms: no fever        Past Medical History:  Diagnosis Date  . Arthritis   . Bleeding from the urethra   . Essential hypertension, benign   . HTN (hypertension)   . Insomnia   . Lesion of urethra   . Mixed hyperlipidemia   . Osteoarthritis   . Paget's disease of vulva     Patient Active Problem List   Diagnosis Date Noted  . Arthritis of finger of right hand 08/29/2019  . Memory loss 01/18/2018  . Easy bruising 01/18/2018  . Knee pain 03/19/2016  . Elevated hemoglobin A1c 08/12/2015  . Paget's disease of vulva 06/26/2015  . Dyslipidemia 06/26/2015  . Lesion of bladder 06/26/2015  . Arthritis of knee, degenerative 06/26/2015  . Post menopausal syndrome 06/26/2015  . Skin lesion 06/26/2015  . Insomnia 04/29/2015  . Paget disease, extra mammary 12/18/2014  . Primary osteoarthritis of both knees 11/04/2014  . Essential hypertension, benign 03/21/2014  . Mixed hyperlipidemia 03/21/2014    Past Surgical History:  Procedure Laterality Date  . ABDOMINAL HYSTERECTOMY      . CHOLECYSTECTOMY    . External hemorrhoid surgery    . GALLBLADDER SURGERY    . TONSILLECTOMY    . TOTAL ABDOMINAL HYSTERECTOMY W/ BILATERAL SALPINGOOPHORECTOMY    . VULVECTOMY       OB History    Gravida  2   Para  2   Term      Preterm      AB      Living        SAB      TAB      Ectopic      Multiple      Live Births           Obstetric Comments  AGE AT [place] AGE 68        Family History  Problem Relation Age of Onset  . Heart disease Father        Diagnosed age 86  . Kidney cancer Father   . Heart failure Mother   . Cancer Brother   . Alzheimer's disease Maternal Aunt   . Alzheimer's disease Maternal Uncle   . Breast cancer Neg Hx     Social History   Tobacco Use  . Smoking status: Never Smoker  . Smokeless tobacco: Never Used  Substance Use Topics  . Alcohol use: No  . Drug use: No    Home Medications Prior to Admission medications   Medication Sig Start Date End Date Taking? Authorizing Provider  amLODipine (  NORVASC) 5 MG tablet Take 1 tablet (5 mg total) by mouth daily. 07/02/19   Karamalegos, Devonne Doughty, DO  Cholecalciferol (VITAMIN D3) 125 MCG (5000 UT) CAPS Take by mouth.    [provider]  diclofenac Sodium (VOLTAREN) 1 % GEL Apply 2 g topically 3 (three) times daily as needed. For knee and hand arthritis pain 08/29/19   Karamalegos, Devonne Doughty, DO  Flaxseed, Linseed, (FLAX SEED OIL) 1000 MG CAPS Take by mouth.    [provider]  hydrochlorothiazide (HYDRODIURIL) 25 MG tablet Take 1 tablet (25 mg total) by mouth daily. 07/02/19   Karamalegos, Devonne Doughty, DO  lisinopril (ZESTRIL) 40 MG tablet Take 1 tablet (40 mg total) by mouth daily. 07/02/19   Karamalegos, Devonne Doughty, DO  Multiple Vitamin (MULTIVITAMIN) tablet Take 1 tablet by mouth daily.    [provider]  simvastatin (ZOCOR) 40 MG tablet Take 1 tablet (40 mg total) by mouth daily. 07/02/19   Karamalegos, Devonne Doughty, DO  traZODone (DESYREL) 100  MG tablet Take 1 tablet (100 mg total) by mouth at bedtime. 07/02/19   Olin Hauser, DO    Allergies    Eggs or egg-derived products and Percocet [oxycodone-acetaminophen]  Review of Systems   Review of Systems  Constitutional: Negative for fever.  HENT: Negative for nosebleeds.   Eyes: Negative for pain and visual disturbance.  Respiratory: Negative for shortness of breath.   Cardiovascular: Negative for chest pain.  Gastrointestinal: Negative for abdominal pain and vomiting.  Genitourinary: Negative for flank pain.  Musculoskeletal: Negative for back pain and neck pain.  Skin: Positive for wound.  Neurological: Negative for weakness and numbness.  Hematological: Does not bruise/bleed easily.  Psychiatric/Behavioral: Negative for confusion.    Physical Exam Updated Vital Signs BP (!) 158/75 (BP Location: Left Arm)   Pulse 76   Temp 98.7 F (37.1 C) (Oral)   Resp 16   SpO2 100%   Physical Exam Vitals and nursing note reviewed.  Constitutional:      Appearance: Normal appearance. She is well-developed.  HENT:     Head:     Comments: Contusion, tenderness left cheek. 3.5 cm laceration left cheek.     Nose: Nose normal.     Mouth/Throat:     Mouth: Mucous membranes are moist.  Eyes:     General: No scleral icterus.    Conjunctiva/sclera: Conjunctivae normal.     Pupils: Pupils are equal, round, and reactive to light.  Neck:     Trachea: No tracheal deviation.  Cardiovascular:     Rate and Rhythm: Normal rate and regular rhythm.     Pulses: Normal pulses.     Heart sounds: Normal heart sounds. No murmur. No friction rub. No gallop.   Pulmonary:     Effort: Pulmonary effort is normal. No respiratory distress.     Breath sounds: Normal breath sounds.  Chest:     Chest wall: No tenderness.  Abdominal:     General: Bowel sounds are normal. There is no distension.     Palpations: Abdomen is soft.     Tenderness: There is no abdominal tenderness. There is  no guarding.     Comments: No abd wall bruising or contusion.   Genitourinary:    Comments: No cva tenderness.  Musculoskeletal:        General: No swelling.     Cervical back: Normal range of motion and neck supple. No rigidity. No muscular tenderness.     Comments: Mid cervical  tenderness, otherwise, CTLS spine, non tender, aligned, no step off. Patient holding RUE extended above head as if 'raising hand' position. Radial pulse 2+. No other focal bony tenderness on bil extremity exam. Good rom.   Skin:    General: Skin is warm and dry.     Findings: No rash.  Neurological:     Mental Status: She is alert.     Comments: Alert, speech normal. GCS 15. Motor intact bil, stre 5/5. Sens grossly intact. RUE rad/med/uln fxn, motor and sensory intact.   Psychiatric:        Mood and Affect: Mood normal.     ED Results / Procedures / Treatments   Labs (all labs ordered are listed, but only abnormal results are displayed) Labs Reviewed - No data to display  EKG None  Radiology No results found.  Procedures .Marland KitchenLaceration Repair  Date/Time: 09/23/2019 3:14 PM Performed by: Lajean Saver, MD Authorized by: Lajean Saver, MD   Consent:    Consent given by:  Patient Anesthesia (see MAR for exact dosages):    Anesthesia method:  Local infiltration   Local anesthetic:  Lidocaine 2% w/o epi Laceration details:    Location:  Face   Face location:  L cheek   Length (cm):  3.5 Repair type:    Repair type:  Intermediate Pre-procedure details:    Preparation:  Patient was prepped and draped in usual sterile fashion and imaging obtained to evaluate for foreign bodies Exploration:    Wound exploration: entire depth of wound probed and visualized     Wound extent: no foreign bodies/material noted   Treatment:    Area cleansed with:  Saline   Amount of cleaning:  Standard   Irrigation solution:  Sterile saline   Irrigation method:  Syringe   Visualized foreign bodies/material removed:  no   Subcutaneous repair:    Suture size:  5-0   Suture material:  Vicryl   Number of sutures:  2 Skin repair:    Repair method:  Sutures   Suture size:  6-0   Suture material:  Prolene   Number of sutures:  6 Approximation:    Approximation:  Close Post-procedure details:    Dressing:  Antibiotic ointment   Patient tolerance of procedure:  Tolerated well, no immediate complications   (including critical care time)  Medications Ordered in ED Medications  morphine 4 MG/ML injection 4 mg (4 mg Intravenous Given 09/23/19 1453)  ondansetron (ZOFRAN) injection 4 mg (4 mg Intravenous Given 09/23/19 1453)  Tdap (BOOSTRIX) injection 0.5 mL (0.5 mLs Intramuscular Given 09/23/19 1457)    ED Course  I have reviewed the triage vital signs and the nursing notes.  Pertinent labs & imaging results that were available during my care of the patient were reviewed by me and considered in my medical decision making (see chart for details).  Clinical Course as of Sep 22 1538  Sun Sep 23, 2019  1535 Pt signed out to me by dr Ashok Cordia.  86 yo female presenting with mechanical fall, struck head, pending CT scan head and face and shoulder.  Facial lac sutured.   [MT]    Clinical Course User Index [MT] Trifan, Carola Rhine, MD   MDM Rules/Calculators/A&P                      Iv ns. Morphine 4 mg iv. zofran iv. Continuous pulse ox and monitor. Imaging ordered. Wound repaired. Tetanus IM.  Kept in c collar precautions, back  board removed.   RUE moved into more anatomic position, with improved pain - radial pulse remains 2+, and arm NVI, no numbness.   Reviewed nursing notes and prior charts for additional history.   1530, xrays and ct pending - signed patient out to Dr  Langston Masker to check xrays and cts, recheck pt, and dispo appropriately.      Final Clinical Impression(s) / ED Diagnoses Final diagnoses:  None    Rx / DC Orders ED Discharge Orders    None       Lajean Saver, MD 09/23/19  1540

## 2019-09-23 NOTE — ED Notes (Signed)
Pt ambulated in room. She was able to stand with minimal assistance and walk around the room. Pt denies any dizziness or feeling of being unsteady on her feet.

## 2019-09-23 NOTE — Discharge Instructions (Addendum)
You have several injuries from your fall today that need attention.  1.) Your shoulder (right) was fractured in two places and also dislocated.  We reduced the dislocation and put it back into place, but you still have 2 fractures.  You may need surgery.  We recommended that you follow up with Dr. Stann Mainland (our orthopedist) in the office tomorrow.  Please call their office in the morning and ask what time you can be seen.  Wear your shoulder sling at ALL times.  If you'd like to, you can reach out to your own orthopedic surgeon.  I included a copy of your CT report for his review.  But if you cannot be seen in his office this week, you should come see our specialist.  2.)  You have a laceration to your face.  We put stitches here (2 dissolvable stitches under the skin, and 6 non-dissolvable stitches through the top of the skin).  These 6 top stitches should be removed in 4-5 days.  Try to avoid direct sunlight to reduce the risk of scarring.  There will likely be some level of scarring.  You can visit an urgent care or your doctors' office for suture removal  3.)  Your CT scan of your brain showed a likely meningioma.  This is usually a benign type of brain tumor, but it requires attention.  Please call our neurosurgeon's office to schedule a follow up with them as soon as possible

## 2019-09-24 NOTE — ED Provider Notes (Signed)
Clinical Course as of Sep 24 122  Sun Sep 23, 2019  1535 Pt signed out to me by dr Ashok Cordia.  86 yo female presenting with mechanical fall, struck head, pending CT scan head and face and shoulder.  Facial lac sutured.   [MT]  1600 Fracture dislocation of right shoulder.  She is neurovascularly intact, with arm extended.  We won't be able to get a CT shoulder with this positioning, but can get CT head and face, possibly neck done.  I'm paging orthopedics   [MT]  1712 IMPRESSION: 1. No acute intracranial pathology. 2. Right parietal meningioma. 3. No acute/traumatic cervical spine pathology. 4. No acute facial bone fractures.   [MT]  1712 FINDINGS: Bones/Joint/Cartilage  Anterior inferior dislocation of the right glenoid. Displaced fracture of the greater tuberosity with 3 cm of posterolateral displacement located along mid glenoid. Mildly comminuted fracture of the surgical neck of right proximal humerus.    [MT]  [id number] I spoke to Dr Roxy Manns of orthopedics who requests that we attempt a single bedside reduction of dislocation.  If we are unsuccessful, we can place her in a sling and have her follow up in the office on Monday with Dr. Stann Mainland.  Dr Ricard Dillon is aware of both fractures and states it is okay to proceed with an attempted reduction.  The patient was consented for procedural sedation.  She has egg allergies, we will use ketamine for sedation and analgesia.     [MT]  1930 Patient was briefly in A Fib during sedation, likely 2/2 ketamine.  She has since returned to sinus rhythm HR 80's and regular for the past 50 minutes.  No prior hx of A Fib.  I suspect this was medication induced, and she remains stable for discharge   [MT]  1939 Son updated by phone, will come pick her up   [MT]  2001 Stable, ambulating, maintaining sinus rhythm, will discharge   [MT]    Clinical Course User Index [MT] Tilden Broz, Carola Rhine, MD   .Ortho Injury Treatment  Date/Time: 09/24/2019 1:23 AM Performed by:  Wyvonnia Dusky, MD Authorized by: Wyvonnia Dusky, MD   Consent:    Consent obtained:  Written   Consent given by:  Patient   Risks discussed:  Fracture, irreducible dislocation, nerve damage and recurrent dislocation   Alternatives discussed:  Delayed treatmentInjury location: shoulder Location details: right shoulder Injury type: fracture-dislocation Dislocation type: anterior Fracture type: surgical neck Pre-procedure neurovascular assessment: neurovascularly intact Pre-procedure distal perfusion: normal Pre-procedure neurological function: normal Pre-procedure range of motion: normal  Anesthesia: Local anesthesia used: no  Patient sedated: Yes. Refer to sedation procedure documentation for details of sedation. Manipulation performed: yes Skin traction used: yes Reduction successful: yes X-ray confirmed reduction: yes Immobilization: sling Post-procedure neurovascular assessment: post-procedure neurovascularly intact Post-procedure distal perfusion: normal Post-procedure neurological function: normal Post-procedure range of motion: normal Patient tolerance: patient tolerated the procedure well with no immediate complications       Wyvonnia Dusky, MD 09/24/19 [phone]

## 2019-09-25 DIAGNOSIS — S42201A Unspecified fracture of upper end of right humerus, initial encounter for closed fracture: Secondary | ICD-10-CM | POA: Diagnosis not present

## 2019-09-25 DIAGNOSIS — M25511 Pain in right shoulder: Secondary | ICD-10-CM | POA: Diagnosis not present

## 2019-09-26 ENCOUNTER — Other Ambulatory Visit (HOSPITAL_COMMUNITY)
Admission: RE | Admit: 2019-09-26 | Discharge: 2019-09-26 | Disposition: A | Discharge status: Home / Self Care | Payer: Medicare PPO | Source: Ambulatory Visit | Attending: Orthopedic Surgery | Admitting: Orthopedic Surgery

## 2019-09-26 ENCOUNTER — Encounter (HOSPITAL_COMMUNITY): Payer: Self-pay | Admitting: Orthopedic Surgery

## 2019-09-26 ENCOUNTER — Other Ambulatory Visit: Payer: Self-pay

## 2019-09-26 DIAGNOSIS — Z20822 Contact with and (suspected) exposure to covid-19: Secondary | ICD-10-CM | POA: Insufficient documentation

## 2019-09-26 DIAGNOSIS — Z01812 Encounter for preprocedural laboratory examination: Secondary | ICD-10-CM | POA: Insufficient documentation

## 2019-09-26 LAB — SARS CORONAVIRUS 2 (TAT 6-24 HRS): SARS Coronavirus 2: NEGATIVE

## 2019-09-26 NOTE — Progress Notes (Signed)
Patient denies shortness of breath, fever, cough and chest pain.  PCP - Dr Nobie Putnam Cardiologist - Denies  Chest x-ray - denies EKG - 09/23/19 Stress Test - denies ECHO - denies Cardiac Cath - denies   Anesthesia review: No  ERAS - Clears til 7:45 am DOS, no drink.  STOP now taking any Aspirin (unless otherwise instructed by your surgeon), Aleve, Naproxen, Ibuprofen, Motrin, Advil, Goody's, BC's, all herbal medications, fish oil, and all vitamins.   Coronavirus Screening Covid test at [facility] on 09/23/19 was negative.   However, someone called patient and told her she needed another covid test.  Patient was retested on 09/26/19 at the [place].  Results pending from 09/26/19 covid test.  Patient verbalized understanding of instructions that were given via phone.

## 2019-09-27 ENCOUNTER — Ambulatory Visit (HOSPITAL_COMMUNITY): Payer: Medicare PPO | Admitting: Anesthesiology

## 2019-09-27 ENCOUNTER — Encounter (HOSPITAL_COMMUNITY): Payer: Self-pay | Admitting: Orthopedic Surgery

## 2019-09-27 ENCOUNTER — Observation Stay (HOSPITAL_COMMUNITY): Payer: Medicare PPO

## 2019-09-27 ENCOUNTER — Encounter (HOSPITAL_COMMUNITY)
Admission: AD | Disposition: A | Discharge status: Home / Self Care | Payer: Self-pay | Source: Home / Self Care | Attending: Orthopedic Surgery

## 2019-09-27 ENCOUNTER — Observation Stay (HOSPITAL_COMMUNITY)
Admission: AD | Admit: 2019-09-27 | Discharge: 2019-09-28 | Disposition: A | Discharge status: Home / Self Care | Payer: Medicare PPO | Attending: Orthopedic Surgery | Admitting: Orthopedic Surgery

## 2019-09-27 DIAGNOSIS — Z885 Allergy status to narcotic agent status: Secondary | ICD-10-CM | POA: Insufficient documentation

## 2019-09-27 DIAGNOSIS — M1990 Unspecified osteoarthritis, unspecified site: Secondary | ICD-10-CM | POA: Diagnosis not present

## 2019-09-27 DIAGNOSIS — G8918 Other acute postprocedural pain: Secondary | ICD-10-CM | POA: Diagnosis not present

## 2019-09-27 DIAGNOSIS — E782 Mixed hyperlipidemia: Secondary | ICD-10-CM | POA: Insufficient documentation

## 2019-09-27 DIAGNOSIS — Z471 Aftercare following joint replacement surgery: Secondary | ICD-10-CM | POA: Diagnosis not present

## 2019-09-27 DIAGNOSIS — Z90722 Acquired absence of ovaries, bilateral: Secondary | ICD-10-CM | POA: Insufficient documentation

## 2019-09-27 DIAGNOSIS — Z8249 Family history of ischemic heart disease and other diseases of the circulatory system: Secondary | ICD-10-CM | POA: Diagnosis not present

## 2019-09-27 DIAGNOSIS — W19XXXA Unspecified fall, initial encounter: Secondary | ICD-10-CM | POA: Insufficient documentation

## 2019-09-27 DIAGNOSIS — I10 Essential (primary) hypertension: Secondary | ICD-10-CM | POA: Diagnosis not present

## 2019-09-27 DIAGNOSIS — G4700 Insomnia, unspecified: Secondary | ICD-10-CM | POA: Diagnosis not present

## 2019-09-27 DIAGNOSIS — Z90710 Acquired absence of both cervix and uterus: Secondary | ICD-10-CM | POA: Diagnosis not present

## 2019-09-27 DIAGNOSIS — Z96611 Presence of right artificial shoulder joint: Secondary | ICD-10-CM

## 2019-09-27 DIAGNOSIS — Z79899 Other long term (current) drug therapy: Secondary | ICD-10-CM | POA: Diagnosis not present

## 2019-09-27 DIAGNOSIS — S42201A Unspecified fracture of upper end of right humerus, initial encounter for closed fracture: Secondary | ICD-10-CM | POA: Diagnosis not present

## 2019-09-27 DIAGNOSIS — Z419 Encounter for procedure for purposes other than remedying health state, unspecified: Secondary | ICD-10-CM

## 2019-09-27 HISTORY — PX: REVERSE SHOULDER ARTHROPLASTY: SHX5054

## 2019-09-27 HISTORY — DX: Nausea with vomiting, unspecified: R11.2

## 2019-09-27 HISTORY — DX: Other specified postprocedural states: Z98.890

## 2019-09-27 SURGERY — ARTHROPLASTY, SHOULDER, TOTAL, REVERSE
Anesthesia: General | Site: Arm Upper | Laterality: Right

## 2019-09-27 MED ORDER — SUGAMMADEX SODIUM 200 MG/2ML IV SOLN
INTRAVENOUS | Status: DC | PRN
  Administered 2019-09-27: 200 mg via INTRAVENOUS

## 2019-09-27 MED ORDER — BUPIVACAINE LIPOSOME 1.3 % IJ SUSP
INTRAMUSCULAR | Status: DC | PRN
  Administered 2019-09-27: 10 mL via PERINEURAL

## 2019-09-27 MED ORDER — FENTANYL CITRATE (PF) 250 MCG/5ML IJ SOLN
INTRAMUSCULAR | Status: AC
  Filled 2019-09-27: qty 5

## 2019-09-27 MED ORDER — BUPIVACAINE-EPINEPHRINE (PF) 0.5% -1:200000 IJ SOLN
INTRAMUSCULAR | Status: DC | PRN
  Administered 2019-09-27: 15 mL via PERINEURAL

## 2019-09-27 MED ORDER — CEFAZOLIN SODIUM-DEXTROSE 1-4 GM/50ML-% IV SOLN
1.0000 g | Freq: Four times a day (QID) | INTRAVENOUS | Status: AC
  Administered 2019-09-27 – 2019-09-28 (×3): 1 g via INTRAVENOUS
  Filled 2019-09-27 (×3): qty 50

## 2019-09-27 MED ORDER — PHENYLEPHRINE HCL (PRESSORS) 10 MG/ML IV SOLN
INTRAVENOUS | Status: AC
  Filled 2019-09-27: qty 1

## 2019-09-27 MED ORDER — NAPROXEN 250 MG PO TABS
250.0000 mg | ORAL_TABLET | Freq: Two times a day (BID) | ORAL | Status: DC
  Administered 2019-09-27 – 2019-09-28 (×2): 250 mg via ORAL
  Filled 2019-09-27 (×3): qty 1

## 2019-09-27 MED ORDER — 0.9 % SODIUM CHLORIDE (POUR BTL) OPTIME
TOPICAL | Status: DC | PRN
  Administered 2019-09-27: 1000 mL

## 2019-09-27 MED ORDER — FENTANYL CITRATE (PF) 250 MCG/5ML IJ SOLN
INTRAMUSCULAR | Status: DC | PRN
  Administered 2019-09-27: 50 ug via INTRAVENOUS

## 2019-09-27 MED ORDER — CEFAZOLIN SODIUM-DEXTROSE 2-4 GM/100ML-% IV SOLN
INTRAVENOUS | Status: AC
  Filled 2019-09-27: qty 100

## 2019-09-27 MED ORDER — DOCUSATE SODIUM 100 MG PO CAPS
100.0000 mg | ORAL_CAPSULE | Freq: Two times a day (BID) | ORAL | Status: DC
  Administered 2019-09-27 – 2019-09-28 (×2): 100 mg via ORAL
  Filled 2019-09-27 (×2): qty 1

## 2019-09-27 MED ORDER — LACTATED RINGERS IV SOLN: INTRAVENOUS | Status: DC

## 2019-09-27 MED ORDER — METOCLOPRAMIDE HCL 5 MG/ML IJ SOLN: 5.0000 mg | Freq: Three times a day (TID) | INTRAMUSCULAR | Status: DC | PRN

## 2019-09-27 MED ORDER — CHLORHEXIDINE GLUCONATE 4 % EX LIQD: 60.0000 mL | Freq: Once | CUTANEOUS | Status: DC

## 2019-09-27 MED ORDER — LISINOPRIL 20 MG PO TABS
40.0000 mg | ORAL_TABLET | Freq: Every day | ORAL | Status: DC
  Administered 2019-09-27 – 2019-09-28 (×2): 40 mg via ORAL
  Filled 2019-09-27 (×2): qty 2

## 2019-09-27 MED ORDER — DEXAMETHASONE SODIUM PHOSPHATE 10 MG/ML IJ SOLN
INTRAMUSCULAR | Status: DC | PRN
  Administered 2019-09-27: 10 mg via INTRAVENOUS

## 2019-09-27 MED ORDER — HYDROCODONE-ACETAMINOPHEN 5-325 MG PO TABS: 1.0000 | ORAL_TABLET | ORAL | Status: DC | PRN

## 2019-09-27 MED ORDER — TRAZODONE HCL 50 MG PO TABS: 100.0000 mg | ORAL_TABLET | Freq: Every evening | ORAL | Status: DC | PRN

## 2019-09-27 MED ORDER — FENTANYL CITRATE (PF) 100 MCG/2ML IJ SOLN
INTRAMUSCULAR | Status: AC
  Administered 2019-09-27: 50 ug via INTRAVENOUS
  Filled 2019-09-27: qty 2

## 2019-09-27 MED ORDER — TRANEXAMIC ACID-NACL 1000-0.7 MG/100ML-% IV SOLN
INTRAVENOUS | Status: AC
  Filled 2019-09-27: qty 100

## 2019-09-27 MED ORDER — PROPOFOL 500 MG/50ML IV EMUL
INTRAVENOUS | Status: DC | PRN
  Administered 2019-09-27: 20 ug/kg/min via INTRAVENOUS

## 2019-09-27 MED ORDER — ONDANSETRON HCL 4 MG PO TABS: 4.0000 mg | ORAL_TABLET | Freq: Four times a day (QID) | ORAL | Status: DC | PRN

## 2019-09-27 MED ORDER — PROPOFOL 10 MG/ML IV BOLUS
INTRAVENOUS | Status: DC | PRN
  Administered 2019-09-27: 100 mg via INTRAVENOUS

## 2019-09-27 MED ORDER — ACETAMINOPHEN 500 MG PO TABS: 1000.0000 mg | ORAL_TABLET | Freq: Four times a day (QID) | ORAL | Status: DC | PRN

## 2019-09-27 MED ORDER — FENTANYL CITRATE (PF) 100 MCG/2ML IJ SOLN: 50.0000 ug | Freq: Once | INTRAMUSCULAR | Status: AC

## 2019-09-27 MED ORDER — METHOCARBAMOL 500 MG PO TABS: 500.0000 mg | ORAL_TABLET | Freq: Four times a day (QID) | ORAL | Status: DC | PRN

## 2019-09-27 MED ORDER — TRANEXAMIC ACID-NACL 1000-0.7 MG/100ML-% IV SOLN
1000.0000 mg | Freq: Once | INTRAVENOUS | Status: AC
  Administered 2019-09-27: 1000 mg via INTRAVENOUS
  Filled 2019-09-27: qty 100

## 2019-09-27 MED ORDER — AMLODIPINE BESYLATE 5 MG PO TABS
5.0000 mg | ORAL_TABLET | Freq: Every day | ORAL | Status: DC
  Administered 2019-09-28: 5 mg via ORAL
  Filled 2019-09-27: qty 1

## 2019-09-27 MED ORDER — METOCLOPRAMIDE HCL 5 MG PO TABS: 5.0000 mg | ORAL_TABLET | Freq: Three times a day (TID) | ORAL | Status: DC | PRN

## 2019-09-27 MED ORDER — ONDANSETRON HCL 4 MG/2ML IJ SOLN
INTRAMUSCULAR | Status: DC | PRN
  Administered 2019-09-27: 4 mg via INTRAVENOUS

## 2019-09-27 MED ORDER — ONDANSETRON HCL 4 MG/2ML IJ SOLN: 4.0000 mg | Freq: Four times a day (QID) | INTRAMUSCULAR | Status: DC | PRN

## 2019-09-27 MED ORDER — LIDOCAINE 2% (20 MG/ML) 5 ML SYRINGE
INTRAMUSCULAR | Status: DC | PRN
  Administered 2019-09-27: 100 mg via INTRAVENOUS

## 2019-09-27 MED ORDER — ACETAMINOPHEN 325 MG PO TABS: 325.0000 mg | ORAL_TABLET | Freq: Four times a day (QID) | ORAL | Status: DC | PRN

## 2019-09-27 MED ORDER — MORPHINE SULFATE (PF) 2 MG/ML IV SOLN: 0.5000 mg | INTRAVENOUS | Status: DC | PRN

## 2019-09-27 MED ORDER — MENTHOL 3 MG MT LOZG: 1.0000 | LOZENGE | OROMUCOSAL | Status: DC | PRN

## 2019-09-27 MED ORDER — PHENYLEPHRINE HCL-NACL 10-0.9 MG/250ML-% IV SOLN
INTRAVENOUS | Status: DC | PRN
  Administered 2019-09-27: 30 ug/min via INTRAVENOUS

## 2019-09-27 MED ORDER — ROCURONIUM BROMIDE 10 MG/ML (PF) SYRINGE
PREFILLED_SYRINGE | INTRAVENOUS | Status: DC | PRN
  Administered 2019-09-27: 30 mg via INTRAVENOUS
  Administered 2019-09-27: 50 mg via INTRAVENOUS

## 2019-09-27 MED ORDER — TRANEXAMIC ACID-NACL 1000-0.7 MG/100ML-% IV SOLN
1000.0000 mg | INTRAVENOUS | Status: AC
  Administered 2019-09-27: 1000 mg via INTRAVENOUS

## 2019-09-27 MED ORDER — MIDAZOLAM HCL 2 MG/2ML IJ SOLN
INTRAMUSCULAR | Status: AC
  Filled 2019-09-27: qty 2

## 2019-09-27 MED ORDER — METHOCARBAMOL 1000 MG/10ML IJ SOLN
500.0000 mg | Freq: Four times a day (QID) | INTRAVENOUS | Status: DC | PRN
  Filled 2019-09-27: qty 5

## 2019-09-27 MED ORDER — EPHEDRINE SULFATE-NACL 50-0.9 MG/10ML-% IV SOSY
PREFILLED_SYRINGE | INTRAVENOUS | Status: DC | PRN
  Administered 2019-09-27 (×2): 10 mg via INTRAVENOUS

## 2019-09-27 MED ORDER — ACETAMINOPHEN 500 MG PO TABS
500.0000 mg | ORAL_TABLET | Freq: Four times a day (QID) | ORAL | Status: DC
  Administered 2019-09-27 – 2019-09-28 (×3): 500 mg via ORAL
  Filled 2019-09-27 (×4): qty 1

## 2019-09-27 MED ORDER — SIMVASTATIN 20 MG PO TABS: 40.0000 mg | ORAL_TABLET | Freq: Every day | ORAL | Status: DC

## 2019-09-27 MED ORDER — PHENOL 1.4 % MT LIQD: 1.0000 | OROMUCOSAL | Status: DC | PRN

## 2019-09-27 MED ORDER — ATORVASTATIN CALCIUM 10 MG PO TABS
20.0000 mg | ORAL_TABLET | Freq: Every day | ORAL | Status: DC
  Administered 2019-09-27: 20 mg via ORAL
  Filled 2019-09-27: qty 2

## 2019-09-27 MED ORDER — HYDROCODONE-ACETAMINOPHEN 7.5-325 MG PO TABS: 1.0000 | ORAL_TABLET | ORAL | Status: DC | PRN

## 2019-09-27 MED ORDER — HYDROCHLOROTHIAZIDE 25 MG PO TABS
25.0000 mg | ORAL_TABLET | Freq: Every day | ORAL | Status: DC
  Administered 2019-09-27 – 2019-09-28 (×2): 25 mg via ORAL
  Filled 2019-09-27 (×2): qty 1

## 2019-09-27 MED ORDER — ACETAMINOPHEN 500 MG PO TABS
ORAL_TABLET | ORAL | Status: AC
  Administered 2019-09-27: 1000 mg via ORAL
  Filled 2019-09-27: qty 2

## 2019-09-27 MED ORDER — CEFAZOLIN SODIUM-DEXTROSE 2-4 GM/100ML-% IV SOLN
2.0000 g | INTRAVENOUS | Status: AC
  Administered 2019-09-27: 2 g via INTRAVENOUS

## 2019-09-27 MED ORDER — PROPOFOL 10 MG/ML IV BOLUS
INTRAVENOUS | Status: AC
  Filled 2019-09-27: qty 20

## 2019-09-27 SURGICAL SUPPLY — 58 items
BASEPLATE GLENOSPHERE 25 (Plate) ×3 IMPLANT
BASEPLATE GLENOSPHERE 25MM (Plate) ×1 IMPLANT
BEARING HUMERAL SHLDER 36M STD (Shoulder) ×4 IMPLANT
BIT DRILL TWIST 2.7 (BIT) ×3 IMPLANT
BIT DRILL TWIST 2.7MM (BIT) ×1
CLOSURE WOUND 1/2 X4 (GAUZE/BANDAGES/DRESSINGS)
COVER SURGICAL LIGHT HANDLE (MISCELLANEOUS) ×4 IMPLANT
COVER WAND RF STERILE (DRAPES) ×4 IMPLANT
DRAPE INCISE IOBAN 66X45 STRL (DRAPES) ×4 IMPLANT
DRAPE ORTHO SPLIT 77X108 STRL (DRAPES) ×4
DRAPE SURG ORHT 6 SPLT 77X108 (DRAPES) ×4 IMPLANT
DRAPE U-SHAPE 47X51 STRL (DRAPES) ×4 IMPLANT
DRSG AQUACEL AG ADV 3.5X 4 (GAUZE/BANDAGES/DRESSINGS) IMPLANT
DRSG AQUACEL AG ADV 3.5X 6 (GAUZE/BANDAGES/DRESSINGS) IMPLANT
DRSG AQUACEL AG ADV 3.5X10 (GAUZE/BANDAGES/DRESSINGS) ×4 IMPLANT
DRSG PAD ABDOMINAL 8X10 ST (GAUZE/BANDAGES/DRESSINGS) ×4 IMPLANT
DURAPREP 26ML APPLICATOR (WOUND CARE) ×4 IMPLANT
ELECT REM PT RETURN 9FT ADLT (ELECTROSURGICAL) ×4
ELECTRODE REM PT RTRN 9FT ADLT (ELECTROSURGICAL) ×2 IMPLANT
GLENOID SPHERE 36MM CVD +3 (Orthopedic Implant) ×4 IMPLANT
GLOVE BIO SURGEON STRL SZ7.5 (GLOVE) ×4 IMPLANT
GLOVE BIOGEL PI IND STRL 8 (GLOVE) ×2 IMPLANT
GLOVE BIOGEL PI INDICATOR 8 (GLOVE) ×2
GOWN STRL REUS W/ TWL LRG LVL3 (GOWN DISPOSABLE) ×2 IMPLANT
GOWN STRL REUS W/ TWL XL LVL3 (GOWN DISPOSABLE) ×2 IMPLANT
GOWN STRL REUS W/TWL LRG LVL3 (GOWN DISPOSABLE) ×2
GOWN STRL REUS W/TWL XL LVL3 (GOWN DISPOSABLE) ×2
KIT BASIN OR (CUSTOM PROCEDURE TRAY) ×4 IMPLANT
KIT TURNOVER KIT B (KITS) ×4 IMPLANT
MANIFOLD NEPTUNE II (INSTRUMENTS) ×4 IMPLANT
NS IRRIG 1000ML POUR BTL (IV SOLUTION) ×4 IMPLANT
PACK SHOULDER (CUSTOM PROCEDURE TRAY) ×4 IMPLANT
PAD ARMBOARD 7.5X6 YLW CONV (MISCELLANEOUS) ×8 IMPLANT
PIN HUMERAL STMN 3.2MMX9IN (INSTRUMENTS) ×4 IMPLANT
SCREW BONE STRL 6.5MMX25MM (Screw) ×4 IMPLANT
SCREW LOCKING 4.75MMX15MM (Screw) ×8 IMPLANT
SCREW LOCKING STRL 4.75X25X3.5 (Screw) ×8 IMPLANT
SHOULDER HUMERAL BEAR 36M STD (Shoulder) ×8 IMPLANT
SLING ARM FOAM STRAP LRG (SOFTGOODS) IMPLANT
SLING ARM FOAM STRAP MED (SOFTGOODS) IMPLANT
SPONGE LAP 18X18 RF (DISPOSABLE) IMPLANT
SPONGE LAP 4X18 RFD (DISPOSABLE) IMPLANT
STAPLER VISISTAT 35W (STAPLE) ×4 IMPLANT
STEM SHOULDER (Stem) ×4 IMPLANT
STRIP CLOSURE SKIN 1/2X4 (GAUZE/BANDAGES/DRESSINGS) IMPLANT
SUCTION FRAZIER HANDLE 10FR (MISCELLANEOUS) ×2
SUCTION TUBE FRAZIER 10FR DISP (MISCELLANEOUS) ×2 IMPLANT
SUT FIBERWIRE #2 38 T-5 BLUE (SUTURE)
SUT MAXBRAID #2 CVD NDL (SUTURE) ×4 IMPLANT
SUT MNCRL AB 4-0 PS2 18 (SUTURE) IMPLANT
SUT VIC AB 2-0 CT1 27 (SUTURE) ×2
SUT VIC AB 2-0 CT1 TAPERPNT 27 (SUTURE) ×2 IMPLANT
SUTURE FIBERWR #2 38 T-5 BLUE (SUTURE) IMPLANT
SYR CONTROL 10ML LL (SYRINGE) IMPLANT
TOWEL GREEN STERILE (TOWEL DISPOSABLE) ×4 IMPLANT
TOWEL GREEN STERILE FF (TOWEL DISPOSABLE) ×4 IMPLANT
WATER STERILE IRR 1000ML POUR (IV SOLUTION) ×4 IMPLANT
YANKAUER SUCT BULB TIP NO VENT (SUCTIONS) ×4 IMPLANT

## 2019-09-27 NOTE — Anesthesia Postprocedure Evaluation (Signed)
Anesthesia Post Note  Patient: Carla Sparks  Procedure(s) Performed: REVERSE SHOULDER ARTHROPLASTY (Right Arm Upper)     Patient location during evaluation: PACU Anesthesia Type: General and Regional Level of consciousness: awake and alert Pain management: pain level controlled Vital Signs Assessment: post-procedure vital signs reviewed and stable Respiratory status: spontaneous breathing, nonlabored ventilation and respiratory function stable Cardiovascular status: blood pressure returned to baseline and stable Postop Assessment: no apparent nausea or vomiting Anesthetic complications: no    Last Vitals:  Vitals:   09/27/19 1432 09/27/19 1447  BP: (!) 141/58 (!) 129/56  Pulse: 76 77  Resp: 15 17  Temp:    SpO2: 100% 94%    Last Pain:  Vitals:   09/27/19 1447  TempSrc:   PainSc: 0-No pain                 Xochilt Conant,W. EDMOND

## 2019-09-27 NOTE — Anesthesia Preprocedure Evaluation (Addendum)
Anesthesia Evaluation  Patient identified by MRN, date of birth, ID band Patient awake    Reviewed: Allergy & Precautions, H&P , NPO status , Patient's Chart, lab work & pertinent test results  History of Anesthesia Complications (+) PONV  Airway Mallampati: II  TM Distance: >3 FB Neck ROM: Full    Dental no notable dental hx. (+) Teeth Intact, Dental Advisory Given   Pulmonary neg pulmonary ROS,    Pulmonary exam normal breath sounds clear to auscultation       Cardiovascular hypertension, On Medications negative cardio ROS   Rhythm:Regular Rate:Normal     Neuro/Psych negative neurological ROS  negative psych ROS   GI/Hepatic negative GI ROS, Neg liver ROS,   Endo/Other  negative endocrine ROS  Renal/GU negative Renal ROS  negative genitourinary   Musculoskeletal  (+) Arthritis ,   Abdominal   Peds  Hematology negative hematology ROS (+)   Anesthesia Other Findings   Reproductive/Obstetrics negative OB ROS                            Anesthesia Physical Anesthesia Plan  ASA: II  Anesthesia Plan: General   Post-op Pain Management:  Regional for Post-op pain   Induction: Intravenous  PONV Risk Score and Plan: 4 or greater and Ondansetron, Dexamethasone and Treatment may vary due to age or medical condition  Airway Management Planned: Oral ETT  Additional Equipment:   Intra-op Plan:   Post-operative Plan: Extubation in OR  Informed Consent: I have reviewed the patients History and Physical, chart, labs and discussed the procedure including the risks, benefits and alternatives for the proposed anesthesia with the patient or authorized representative who has indicated his/her understanding and acceptance.     Dental advisory given  Plan Discussed with: CRNA  Anesthesia Plan Comments:         Anesthesia Quick Evaluation

## 2019-09-27 NOTE — Brief Op Note (Signed)
09/27/2019  1:53 PM  PATIENT:  Carla Sparks  86 y.o. female  PRE-OPERATIVE DIAGNOSIS:  Right proximal humerus fracture  POST-OPERATIVE DIAGNOSIS:  Right proximal humerus fracture  PROCEDURE:  Procedure(s) with comments: REVERSE SHOULDER ARTHROPLASTY (Right) - 2 hrs RNFA  SURGEON:  Surgeon(s) and Role:    * Stann Mainland, Elly Modena, MD - Primary  PHYSICIAN ASSISTANT:   ASSISTANTS: Katy Apo, RNFA   ANESTHESIA:   regional and general  EBL:  100 mL   BLOOD ADMINISTERED:none  DRAINS: none   LOCAL MEDICATIONS USED:  NONE  SPECIMEN:  No Specimen  DISPOSITION OF SPECIMEN:  N/A  COUNTS:  YES  TOURNIQUET:  * No tourniquets in log *  DICTATION: .Note written in EPIC  PLAN OF CARE: Admit for overnight observation  PATIENT DISPOSITION:  PACU - hemodynamically stable.   Delay start of Pharmacological VTE agent (>24hrs) due to surgical blood loss or risk of bleeding: not applicable

## 2019-09-27 NOTE — Anesthesia Procedure Notes (Signed)
Procedure Name: Intubation Date/Time: 09/27/2019 12:22 PM Performed by: Alain Marion, CRNA Pre-anesthesia Checklist: Patient identified, Emergency Drugs available, Suction available and Patient being monitored Patient Re-evaluated:Patient Re-evaluated prior to induction Oxygen Delivery Method: Circle System Utilized Preoxygenation: Pre-oxygenation with 100% oxygen Induction Type: IV induction Ventilation: Mask ventilation without difficulty Laryngoscope Size: Miller and 2 Grade View: Grade I Tube type: Oral Number of attempts: 1 Airway Equipment and Method: Stylet Placement Confirmation: ETT inserted through vocal cords under direct vision,  positive ETCO2 and breath sounds checked- equal and bilateral Secured at: 21 cm Tube secured with: Tape Dental Injury: Teeth and Oropharynx as per pre-operative assessment

## 2019-09-27 NOTE — Transfer of Care (Signed)
Immediate Anesthesia Transfer of Care Note  Patient: Carla Sparks  Procedure(s) Performed: REVERSE SHOULDER ARTHROPLASTY (Right Arm Upper)  Patient Location: PACU  Anesthesia Type:General, regional  Level of Consciousness: awake, alert  and oriented  Airway & Oxygen Therapy: Patient Spontanous Breathing and Patient connected to face mask oxygen  Post-op Assessment: Report given to RN and Post -op Vital signs reviewed and stable  Post vital signs: Reviewed and stable  Last Vitals:  Vitals Value Taken Time  BP 138/57 09/27/19 1417  Temp 36.7 C 09/27/19 1417  Pulse 79 09/27/19 1422  Resp 23 09/27/19 1422  SpO2 100 % 09/27/19 1422  Vitals shown include unvalidated device data.  Last Pain:  Vitals:   09/27/19 1417  TempSrc:   PainSc: Asleep      Patients Stated Pain Goal: 3 ([id number])  Complications: No apparent anesthesia complications

## 2019-09-27 NOTE — Op Note (Signed)
Date: 09/27/2019    PRE-OPERATIVE DIAGNOSIS:  Right proximal humerus fracture   POST-OPERATIVE DIAGNOSIS:  Same   PROCEDURE:  1. REVERSE SHOULDER ARTHROPLASTY    SURGEON:  Nicholes Stairs, MD   ASSISTANT: Katy Apo, RNFA   ANESTHESIA:   General with a block   ESTIMATED BLOOD LOSS: See anesthesia record   PREOPERATIVE INDICATIONS:  Carla Sparks is a right handed 86 year old who sustained a Right proximal humerus fracture  following a fall.  In dislocation over the weekend.  She had a reduction performed in the emergency department which was successful.  She did however have persistence of greater tuberosity displacement that was more than acceptable.  She was recommended for open reduction and internal fixation to control the greater tuberosity fracture fragment, and its associated rotator cuff attachments.  We discussed this procedure in detail as well as potential need for arthroplasty in the future.  We discussed the risk of bleeding, infection, dislocation, prosthetic fracture, avascular necrosis, stiffness, pain, weakness, and the risk of anesthesia.  She has provided informed consent to proceed   OPERATIVE IMPLANTS: Biomet size 10 mini stem press fitted  with a 17mm standard  liner and a 36 mm +3 glenosphere with a 25 mm (mini) baseplate and 4, 624THL locking screws and one central 6.5 mm nonlocking screw.   OPERATIVE FINDINGS:   At the onset of the procedure we performed our standard deltopectoral approach.  We had intended on performing an open reduction.  As we entered the rotator interval, we encountered the humeral head to be completely devoid of any rotator cuff attachment.  In fact the humeral head was flipped 90 with the articular surface facing anterior.  This was consistent with an anatomic neck fracture.  The humeral head had no soft tissue attachment.  At this juncture, we elected to convert the procedure to a reverse shoulder arthroplasty, given her age, and high  likelihood of avascular necrosis of this denuded humeral head component.  We were able to press-fit humeral stem and there were no issues with the reverse arthroplasty.   OPERATIVE PROCEDURE: The patient was brought to the operating room and placed in the supine position. General anesthesia was administered. IV antibiotics were given. Time out was performed. The upper extremity was prepped and draped in usual sterile fashion. The patient was in a beachchair position. Deltopectoral approach was carried out. After dissection through skin and subcutaneous fat, the cephalic vein was identified with the deltopectoral interval.  This was mobilized and taken Lateral.  Next, we opened the clavipectoral fascia just lateral to the conjoined tendon.  We entered this bluntly.  We then released the transverse humeral ligament and mobilized the biceps.  As we entered the rotator interval proximally, the humeral head was noted to be rotated 90, and denuded of any soft tissue attachment.  The humeral head freely fell from the wound.  At this juncture we elected to convert to a reverse shoulder arthroplasty.   The fracture was identified and working through the fracture in the biceps groove the lesser and greater tuberosities were freed up and tagged with #2 Fiber wire sutures.  The humeral head was removed from the wound.       I then performed circumferential releases of the humerus.  I then moved to sizing the humerus.  There was no calcar bone loss and this was used to reference the height of the stem, as was the upper border of the pectoralis major tendon.  The  canal was reamed and found to fit best with a 10 mm fracture stem.   We next turned to the glenoid.  Deep retractors were placed, and I resected the labrum as well as the residual long head of biceps, and then placed a guidepin into the center position on the glenoid, with slight inferior declination. I then reamed over the guidepin, and this created a small  metaphyseal cancellus blush inferiorly, removing just the cartilage to the subchondral bone superiorly. The base plate was selected and impacted place, and then I secured it centrally with a nonlocking screw, and I had excellent purchase both inferiorly and superiorly. I placed a short locking screws on anterior aspect,  And posterior aspect.   I then turned my attention to the glenosphere, and impacted this into place, placing slight inferior offset.    The glenosphere was completely seated, and had engagement of the [facility] taper. I then turned my attention back to the humerus.    The 10 mm mini stem was seated to the appropriate height to allow approximate 5.6 cm from the top of the Pectoralis major tendon to the top of the glenosphere.  The stem was placed in 30 degrees of retroversion.   Once the stem was impacted into place we trialed poly liners.  Using the standard humeral metaglen,  The shoulder had excellent motion, and was stable.  The final poly was impacted and again showed good motion and stability.  Next, I irrigated the wounds copiously.    The greater tuberosity was brought back to the humeral stem suture holes and secured with bone graft from the humeral head as augment.  The lesser tuberosity was unfortunately not able to be brought back to the stem due to lateralization from the +3 glenosphere, but the deltoid was noted to have excellent tension.  The axillary nerve was palpated at the end of implanting, and found to be in continuity and not under undo tension.   I then irrigated the shoulder copiously once more, repaired the deltopectoral interval with  #2 FiberWire followed by subcutaneous Vicryl and then staples for the skin. The patient was awakened and returned back in stable and satisfactory condition. There no complications and he tolerated the procedure well.  All counts were correct.  The patient awakened from general anesthesia with no complications and transferred to PACU in  stable condition.   Postoperative Plan: Carla Sparks Will remain in her sling at all times, unless doing activities of daily living or elbow, hand, and wrist range of motion.  No weightbearing with the right arm.  She will be in the sling for a proximally 4 weeks.  The first 2 weeks she will be quiet.  We will then begin physical therapy working on passive range motion only.  I will see her in the office in 2 weeks she will be admitted postoperatively for overnight observation.

## 2019-09-27 NOTE — Anesthesia Procedure Notes (Signed)
Anesthesia Regional Block: Interscalene brachial plexus block   Pre-Anesthetic Checklist: ,, timeout performed, Correct Patient, Correct Site, Correct Laterality, Correct Procedure, Correct Position, site marked, Risks and benefits discussed, pre-op evaluation,  At surgeon's request and post-op pain management  Laterality: Right  Prep: Maximum Sterile Barrier Precautions used, chloraprep       Needles:  Injection technique: Single-shot  Needle Type: Echogenic Stimulator Needle     Needle Length: 5cm  Needle Gauge: 22     Additional Needles:   Procedures:,,,, ultrasound used (permanent image in chart),,,,  Narrative:  Start time: 09/27/2019 9:20 AM End time: 09/27/2019 9:30 AM Injection made incrementally with aspirations every 5 mL. Anesthesiologist: Roderic Palau, MD  Additional Notes: 2% Lidocaine skin wheel.

## 2019-09-27 NOTE — H&P (Signed)
ORTHOPAEDIC H and P  REQUESTING PHYSICIAN: Nicholes Stairs, MD  PCP:  Olin Hauser, DO  Chief Complaint: Right humerus fracture  HPI: Carla Sparks is a 86 y.o. female who complains of right arm pain and dysfunction following a fall.  She sustained a right proximal humerus fracture and dislocation following a fall.  She had the shoulder reduced but persistent greater tuberosity fracture with displacement.  She is here today for ORIF.  Past Medical History:  Diagnosis Date  . Arthritis   . Bleeding from the urethra   . Essential hypertension, benign   . HTN (hypertension)   . Insomnia   . Lesion of urethra   . Mixed hyperlipidemia   . Osteoarthritis   . Paget's disease of vulva   . PONV (postoperative nausea and vomiting)    Past Surgical History:  Procedure Laterality Date  . ABDOMINAL HYSTERECTOMY    . CHOLECYSTECTOMY    . External hemorrhoid surgery    . GALLBLADDER SURGERY    . TONSILLECTOMY    . TOTAL ABDOMINAL HYSTERECTOMY W/ BILATERAL SALPINGOOPHORECTOMY    . VULVECTOMY     several surgeries   Social History   Socioeconomic History  . Marital status: Married    Spouse name: Not on file  . Number of children: Not on file  . Years of education: 10  . Highest education level: 12th grade  Occupational History  . Not on file  Tobacco Use  . Smoking status: Never Smoker  . Smokeless tobacco: Never Used  Substance and Sexual Activity  . Alcohol use: No  . Drug use: No  . Sexual activity: Not on file    Comment: Hysterectomy  Other Topics Concern  . Not on file  Social History Narrative  . Not on file   Social Determinants of Health   Financial Resource Strain:   . Difficulty of Paying Living Expenses: Not on file  Food Insecurity:   . Worried About Charity fundraiser in the Last Year: Not on file  . Ran Out of Food in the Last Year: Not on file  Transportation Needs:   . Lack of Transportation (Medical): Not on file  .  Lack of Transportation (Non-Medical): Not on file  Physical Activity:   . Days of Exercise per Week: Not on file  . Minutes of Exercise per Session: Not on file  Stress:   . Feeling of Stress : Not on file  Social Connections:   . Frequency of Communication with Friends and Family: Not on file  . Frequency of Social Gatherings with Friends and Family: Not on file  . Attends Religious Services: Not on file  . Active Member of Clubs or Organizations: Not on file  . Attends Archivist Meetings: Not on file  . Marital Status: Not on file   Family History  Problem Relation Age of Onset  . Heart disease Father        Diagnosed age 37  . Kidney cancer Father   . Heart failure Mother   . Cancer Brother   . Alzheimer's disease Maternal Aunt   . Alzheimer's disease Maternal Uncle   . Breast cancer Neg Hx    Allergies  Allergen Reactions  . Eggs Or Egg-Derived Products   . Percocet [Oxycodone-Acetaminophen] Rash    And darvocet   Prior to Admission medications   Medication Sig Start Date End Date Taking? Authorizing Provider  amLODipine (NORVASC) 5 MG tablet Take 1 tablet (5  mg total) by mouth daily. 07/02/19  Yes Karamalegos, Devonne Doughty, DO  Cholecalciferol (VITAMIN D3) 125 MCG (5000 UT) CAPS Take 5,000 Units by mouth daily.    Yes [provider]  diclofenac Sodium (VOLTAREN) 1 % GEL Apply 2 g topically 3 (three) times daily as needed. For knee and hand arthritis pain 08/29/19  Yes Karamalegos, Devonne Doughty, DO  Flaxseed, Linseed, (FLAX SEED OIL) 1000 MG CAPS Take 1,000 mg by mouth daily.    Yes [provider]  hydrochlorothiazide (HYDRODIURIL) 25 MG tablet Take 1 tablet (25 mg total) by mouth daily. 07/02/19  Yes Karamalegos, Devonne Doughty, DO  ibuprofen (ADVIL) 600 MG tablet Take 1 tablet (600 mg total) by mouth every 6 (six) hours as needed for up to 30 doses for mild pain or moderate pain. 09/23/19  Yes Wyvonnia Dusky, MD  lisinopril (ZESTRIL) 40 MG tablet  Take 1 tablet (40 mg total) by mouth daily. 07/02/19  Yes Karamalegos, Devonne Doughty, DO  Multiple Vitamin (MULTIVITAMIN WITH MINERALS) TABS tablet Take 1 tablet by mouth daily.   Yes [provider]  oxyCODONE (ROXICODONE) 5 MG immediate release tablet Take 1 tablet (5 mg total) by mouth every 6 (six) hours as needed for up to 10 doses for breakthrough pain. 09/23/19  Yes Wyvonnia Dusky, MD  simvastatin (ZOCOR) 40 MG tablet Take 1 tablet (40 mg total) by mouth daily. Patient taking differently: Take 40 mg by mouth at bedtime.  07/02/19  Yes Karamalegos, Devonne Doughty, DO  traZODone (DESYREL) 100 MG tablet Take 1 tablet (100 mg total) by mouth at bedtime. Patient taking differently: Take 100 mg by mouth at bedtime as needed for sleep.  07/02/19  Yes Karamalegos, Devonne Doughty, DO   No results found.  Positive ROS: All other systems have been reviewed and were otherwise negative with the exception of those mentioned in the HPI and as above.  Physical Exam: General: Alert, no acute distress Cardiovascular: No pedal edema Respiratory: No cyanosis, no use of accessory musculature GI: No organomegaly, abdomen is soft and non-tender Skin: No lesions in the area of chief complaint Neurologic: Sensation intact distally Psychiatric: Patient is competent for consent with normal mood and affect Lymphatic: No axillary or cervical lymphadenopathy  MUSCULOSKELETAL:  RUE- ecchymosis noted.  +NVI  Assessment: 1. Right proximal humerus fracture  Plan: - The risks, benefits, and alternatives were discussed with the patient. There are risks associated with the surgery including, but not limited to, problems with anesthesia (death), infection, differences in leg length/angulation/rotation, fracture of bones, loosening or failure of implants, malunion, nonunion, hematoma (blood accumulation) which may require surgical drainage, blood clots, pulmonary embolism, nerve injury, and blood vessel injury. The  patient understands these risks and elects to proceed.   - plan for dc home post op  Nicholes Stairs, MD Cell [phone]    09/27/2019 10:09 AM

## 2019-09-28 DIAGNOSIS — Z90710 Acquired absence of both cervix and uterus: Secondary | ICD-10-CM | POA: Diagnosis not present

## 2019-09-28 DIAGNOSIS — M1990 Unspecified osteoarthritis, unspecified site: Secondary | ICD-10-CM | POA: Diagnosis not present

## 2019-09-28 DIAGNOSIS — S42201A Unspecified fracture of upper end of right humerus, initial encounter for closed fracture: Secondary | ICD-10-CM | POA: Diagnosis not present

## 2019-09-28 DIAGNOSIS — G4700 Insomnia, unspecified: Secondary | ICD-10-CM | POA: Diagnosis not present

## 2019-09-28 DIAGNOSIS — Z90722 Acquired absence of ovaries, bilateral: Secondary | ICD-10-CM | POA: Diagnosis not present

## 2019-09-28 DIAGNOSIS — I10 Essential (primary) hypertension: Secondary | ICD-10-CM | POA: Diagnosis not present

## 2019-09-28 DIAGNOSIS — Z885 Allergy status to narcotic agent status: Secondary | ICD-10-CM | POA: Diagnosis not present

## 2019-09-28 DIAGNOSIS — E782 Mixed hyperlipidemia: Secondary | ICD-10-CM | POA: Diagnosis not present

## 2019-09-28 DIAGNOSIS — Z79899 Other long term (current) drug therapy: Secondary | ICD-10-CM | POA: Diagnosis not present

## 2019-09-28 MED ORDER — ONDANSETRON 4 MG PO TBDP: 4.0000 mg | ORAL_TABLET | Freq: Three times a day (TID) | ORAL | 0 refills | Status: DC | PRN

## 2019-09-28 MED ORDER — ADULT MULTIVITAMIN W/MINERALS CH: 1.0000 | ORAL_TABLET | Freq: Every day | ORAL | Status: DC

## 2019-09-28 MED ORDER — ENSURE ENLIVE PO LIQD: 237.0000 mL | Freq: Two times a day (BID) | ORAL | Status: DC

## 2019-09-28 MED ORDER — METHOCARBAMOL 500 MG PO TABS: 500.0000 mg | ORAL_TABLET | Freq: Four times a day (QID) | ORAL | 1 refills | Status: DC | PRN

## 2019-09-28 NOTE — Evaluation (Signed)
Occupational Therapy Evaluation Patient Details Name: Carla Sparks MRN: [id number] DOB: [birth year] Today's Date: 09/28/2019    History of Present Illness Carla Sparks is a 86 y.o. female who complains of right arm pain and dysfunction following a fall.  She sustained a right proximal humerus fracture and dislocation following a fall.  She had ORIF and reverse TSA   Clinical Impression   Pt is at min A level with ADLs./selfcare and Mod I with mobility. Pt and her husband educated on sling wear, compensatory ADL techniques, R UE positioning, ROM of R elbow, wrist and digits (to tolerance). All education completed with pt and her husband verbalizing and demonstrating understanding. No further acute OT is indicated at this time.    Follow Up Recommendations  Follow surgeon's recommendation for DC plan and follow-up therapies;Outpatient OT    Equipment Recommendations  None recommended by OT    Recommendations for Other Services       Precautions / Restrictions Precautions Precautions: Fall;Shoulder Type of Shoulder Precautions: NWB, no ROM Shoulder Interventions: Shoulder sling/immobilizer;At all times;Off for dressing/bathing/exercises Precaution Booklet Issued: Yes (comment) Required Braces or Orthoses: Sling Restrictions Weight Bearing Restrictions: Yes RUE Weight Bearing: Non weight bearing      Mobility Bed Mobility Overal bed mobility: Modified Independent                Transfers Overall transfer level: Modified independent Equipment used: 1 person hand held assist;None             General transfer comment: HHA initially, then progressed to none    Balance Overall balance assessment: Mild deficits observed, not formally tested                                         ADL either performed or assessed with clinical judgement   ADL Overall ADL's : Needs assistance/impaired Eating/Feeding: Set up;Independent;Sitting    Grooming: Wash/dry hands;Wash/dry face;Supervision/safety;Set up;Standing;With caregiver independent assisting   Upper Body Bathing: Minimal assistance;With caregiver independent assisting   Lower Body Bathing: Minimal assistance;With caregiver independent assisting   Upper Body Dressing : Minimal assistance;With caregiver independent assisting   Lower Body Dressing: Minimal assistance;With caregiver independent assisting   Toilet Transfer: Modified Independent;Ambulation   Toileting- Clothing Manipulation and Hygiene: Minimal assistance;Sit to/from stand;With caregiver independent assisting       Functional mobility during ADLs: Modified independent General ADL Comments: pt and her husband educated on compensatory UB selfcare/ADL techniques, handout provided     Vision Patient Visual Report: No change from baseline       Perception     Praxis      Pertinent Vitals/Pain Pain Assessment: No/denies pain     Hand Dominance Right   Extremity/Trunk Assessment Upper Extremity Assessment Upper Extremity Assessment: Generalized weakness;RUE deficits/detail RUE: Unable to fully assess due to immobilization       Cervical / Trunk Assessment Cervical / Trunk Assessment: Normal   Communication Communication Communication: No difficulties   Cognition Arousal/Alertness: Awake/alert Behavior During Therapy: WFL for tasks assessed/performed Overall Cognitive Status: Within Functional Limits for tasks assessed                                     General Comments       Exercises     Shoulder Instructions Shoulder Instructions Donning/doffing  shirt without moving shoulder: Minimal assistance;Caregiver independent with task Method for sponge bathing under operated UE: Minimal assistance;Caregiver independent with task Donning/doffing sling/immobilizer: Minimal assistance;Caregiver independent with task Correct positioning of sling/immobilizer:  Supervision/safety;Caregiver independent with task ROM for elbow, wrist and digits of operated UE: Supervision/safety;Caregiver independent with task Sling wearing schedule (on at all times/off for ADL's): Independent;Caregiver independent with task Proper positioning of operated UE when showering: Supervision/safety;Caregiver independent with task Positioning of UE while sleeping: Supervision/safety;Caregiver independent with task    Home Living Family/patient expects to be discharged to:: Private residence Living Arrangements: Spouse/significant other Available Help at Discharge: Family;Available 24 hours/day Type of Home: House Home Access: Level entry     Home Layout: One level     Bathroom Shower/Tub: Teacher, early years/pre: Handicapped height     Home Equipment: Grab bars - tub/shower          Prior Functioning/Environment Level of Independence: Independent                 OT Problem List: Decreased strength;Decreased knowledge of use of DME or AE;Impaired UE functional use      OT Treatment/Interventions: Self-care/ADL training;DME and/or AE instruction;Therapeutic activities;Patient/family education;Therapeutic exercise    OT Goals(Current goals can be found in the care plan section) Acute Rehab OT Goals Patient Stated Goal: go home OT Goal Formulation: With patient/family  OT Frequency: Min 2X/week   Barriers to D/C:            Co-evaluation              AM-PAC OT "6 Clicks" Daily Activity     Outcome Measure Help from another person eating meals?: None Help from another person taking care of personal grooming?: None Help from another person toileting, which includes using toliet, bedpan, or urinal?: A Little Help from another person bathing (including washing, rinsing, drying)?: A Little Help from another person to put on and taking off regular upper body clothing?: A Little Help from another person to put on and taking off  regular lower body clothing?: A Little 6 Click Score: 20   End of Session Equipment Utilized During Treatment: Gait belt;Other (comment)(R UE sling)  Activity Tolerance: Patient tolerated treatment well Patient left: in chair;with call bell/phone within reach;with family/visitor present  OT Visit Diagnosis: Muscle weakness (generalized) (M62.81);History of falling (Z91.81);Pain                Time: [id number] OT Time Calculation (min): 57 min Charges:  OT General Charges $OT Visit: 1 Visit OT Evaluation $OT Eval Low Complexity: 1 Low OT Treatments $Self Care/Home Management : 8-22 mins $Therapeutic Activity: 8-22 mins $Therapeutic Exercise: 8-22 mins    Britt Bottom 09/28/2019, 10:53 AM

## 2019-09-28 NOTE — Progress Notes (Signed)
   Subjective:  Patient reports pain as mild.  No pain in shoulder.  Block still effective.  Some right knee pain and left neck pain at the shoulder strap.  No f/n/v/cp  Objective:   VITALS:   Vitals:   09/28/19 0000 09/28/19 0450 09/28/19 0724 09/28/19 1304  BP: 121/64 (!) 119/57 116/65 (!) 119/54  Pulse: 83 85 78 81  Resp: 16 17 16 16   Temp: 98.2 F (36.8 C) 98.7 F (37.1 C) 98.2 F (36.8 C) 98.3 F (36.8 C)  TempSrc:  Oral Oral Oral  SpO2: 94% 94% 97% 99%  Weight:      Height:        Neurovascular intact Sensation intact distally Incision: dressing C/D/I and scant drainage Compartment soft Decreased sensation proximally at the shoulder  Lab Results  Component Value Date   WBC 17.3 (H) 09/23/2019   HGB 14.3 09/23/2019   HCT 42.2 09/23/2019   MCV 92.1 09/23/2019   PLT 225 09/23/2019   BMET    Component Value Date/Time   NA 141 09/23/2019 1620   NA 141 04/30/2015 0827   NA 140 03/19/2014 1435   K 3.4 (L) 09/23/2019 1620   K 3.8 03/19/2014 1435   CL 103 09/23/2019 1620   CL 102 03/19/2014 1435   CO2 23 09/23/2019 1620   CO2 30 03/19/2014 1435   GLUCOSE 134 (H) 09/23/2019 1620   GLUCOSE 104 (H) 03/19/2014 1435   BUN 19 09/23/2019 1620   BUN 18 04/30/2015 0827   BUN 17 03/19/2014 1435   CREATININE 0.76 09/23/2019 1620   CREATININE 0.85 08/22/2019 0812   CALCIUM 10.0 09/23/2019 1620   CALCIUM 9.3 03/19/2014 1435   GFRNONAA >60 09/23/2019 1620   GFRNONAA 63 08/22/2019 0812   GFRAA >60 09/23/2019 1620   GFRAA 73 08/22/2019 0812     Assessment/Plan: 1 Day Post-Op   Active Problems:   Closed fracture of right proximal humerus   Advance diet Up with therapy - NWB to RUE - maintain bandage x 2 weeks  - sutures out of face  - dc home  - follow up with Dr. Stann Mainland 2 weeks   Nicholes Stairs 09/28/2019, 3:43 PM   Geralynn Rile, MD [phone]

## 2019-09-28 NOTE — Progress Notes (Signed)
Discharge paperwork and instructions given to pt. Pt not in distress and tolerated well. Pt sutures removed from left cheek before discharge. Pt's husband at bedside.

## 2019-09-28 NOTE — Progress Notes (Signed)
Initial Nutrition Assessment  DOCUMENTATION CODES:   Not applicable  INTERVENTION:   -Ensure Enlive po BID, each supplement provides 350 kcal and 20 grams of protein -MVI with minerals daily  NUTRITION DIAGNOSIS:   Increased nutrient needs related to post-op healing as evidenced by estimated needs.  GOAL:   Patient will meet greater than or equal to 90% of their needs  MONITOR:   PO intake, Supplement acceptance, Weight trends, Skin, Labs, I & O's  REASON FOR ASSESSMENT:   Malnutrition Screening Tool    ASSESSMENT:   Carla Sparks is a 86 y.o. female who complains of right arm pain and dysfunction following a fall.  She sustained a right proximal humerus fracture and dislocation following a fall.  She had the shoulder reduced but persistent greater tuberosity fracture with displacement.  She is here today for ORIF.  Pt admitted with rt proximal humerus fracture.   3/4- s/p PROCEDURE: 1. REVERSE SHOULDER ARTHROPLASTY  Reviewed I/O's: +850 ml x 24 hours  Attempted to speak with pt, however, in with therapy at times of both attempted visits. Unable to obtain further nutrition-related history at this time.   No meal intake data to assess at this time.  Reviewed wt hx; pt has experienced a 7.7% wt loss over the past year. While this is not significant for time frame, this is concerning give advanced age. Pt would benefit from addition of oral nutrition supplements.   Medications reviewed and include colace.   Labs reviewed.   Diet Order:   Diet Order            Diet regular Room service appropriate? Yes; Fluid consistency: Thin  Diet effective now              EDUCATION NEEDS:   No education needs have been identified at this time  Skin:  Skin Assessment: Skin Integrity Issues: Skin Integrity Issues:: Incisions Incisions: closed rt shoulder  Last BM:  09/26/19  Height:   Ht Readings from Last 1 Encounters:  09/27/19 5\' 5"  (1.651 m)    Weight:    Wt Readings from Last 1 Encounters:  09/27/19 70.3 kg    Ideal Body Weight:  56.8 kg  BMI:  Body mass index is 25.79 kg/m.  Estimated Nutritional Needs:   Kcal:  1550-1750  Protein:  75-90 grams  Fluid:  > 1.5 L    Loistine Chance, RD, LDN, [place] Registered Dietitian II Certified Diabetes Care and Education Specialist Please refer to [facility] for RD and/or RD on-call/weekend/after hours pager

## 2019-09-28 NOTE — Discharge Instructions (Signed)
-  No weightbearing to right upper extremity  -You may remove your sling to shower, but do not submerge incision underwater.  Otherwise sling to be worn at all times and was doing elbow and hand and wrist range of motion.  -Apply ice to the right shoulder for 30 minutes at a time throughout the day as many times as possible.-   -You may also take Tylenol and Advil around-the-clock for pain relief.  Oxycodone as needed as well as Robaxin for muscle spasms.  -Maintain your postoperative bandage until your follow-up appointment.  -Return to see Dr. Stann Mainland in 2 weeks.

## 2019-10-02 ENCOUNTER — Encounter: Payer: Self-pay | Admitting: *Deleted

## 2019-10-05 DIAGNOSIS — G8929 Other chronic pain: Secondary | ICD-10-CM | POA: Diagnosis not present

## 2019-10-05 DIAGNOSIS — M25461 Effusion, right knee: Secondary | ICD-10-CM | POA: Diagnosis not present

## 2019-10-05 DIAGNOSIS — M25462 Effusion, left knee: Secondary | ICD-10-CM | POA: Diagnosis not present

## 2019-10-05 DIAGNOSIS — M170 Bilateral primary osteoarthritis of knee: Secondary | ICD-10-CM | POA: Diagnosis not present

## 2019-10-05 DIAGNOSIS — M25561 Pain in right knee: Secondary | ICD-10-CM | POA: Diagnosis not present

## 2019-10-05 DIAGNOSIS — M25562 Pain in left knee: Secondary | ICD-10-CM | POA: Diagnosis not present

## 2019-10-11 NOTE — Discharge Summary (Signed)
Patient ID: Carla Sparks MRN: [id number] DOB/AGE: May 09, 1934 86 y.o.  Admit date: 09/27/2019 Discharge date: 09/28/2019  Primary Diagnosis: Right four-part proximal humerus fracture Admission Diagnoses:  Past Medical History:  Diagnosis Date  . Arthritis   . Bleeding from the urethra   . Essential hypertension, benign   . HTN (hypertension)   . Insomnia   . Lesion of urethra   . Mixed hyperlipidemia   . Osteoarthritis   . Paget's disease of vulva   . PONV (postoperative nausea and vomiting)    Discharge Diagnoses:   Active Problems:   Closed fracture of right proximal humerus  Estimated body mass index is 25.79 kg/m as calculated from the following:   Height as of this encounter: 5\' 5"  (1.651 m).   Weight as of this encounter: 70.3 kg.  Procedure:  Procedure(s) (LRB): REVERSE SHOULDER ARTHROPLASTY (Right)   Consults: None  HPI: Carla Sparks was admitted following a unfortunate fall.  Had a four-part proximal humerus fracture.  She was indicated for operative management. Laboratory Data: Hospital Outpatient Visit on 09/26/2019  Component Date Value Ref Range Status  . SARS Coronavirus 2 09/26/2019 NEGATIVE  NEGATIVE Final   Comment: (NOTE) SARS-CoV-2 target nucleic acids are NOT DETECTED. The SARS-CoV-2 RNA is generally detectable in upper and lower respiratory specimens during the acute phase of infection. Negative results do not preclude SARS-CoV-2 infection, do not rule out co-infections with other pathogens, and should not be used as the sole basis for treatment or other patient management decisions. Negative results must be combined with clinical observations, patient history, and epidemiological information. The expected result is Negative. Fact Sheet for Patients: SugarRoll.be Fact Sheet for Healthcare Providers: https://www.woods-mathews.com/ This test is not yet approved or cleared by the [place] FDA and  has  been authorized for detection and/or diagnosis of SARS-CoV-2 by FDA under an Emergency Use Authorization (EUA). This EUA will remain  in effect (meaning this test can be used) for the duration of the COVID-19 declaration under Section 56                          4(b)(1) of the Act, 21 U.S.C. section 360bbb-3(b)(1), unless the authorization is terminated or revoked sooner. Performed at [facility], [place] [address] [place], [place] [address]   Admission on 09/23/2019, Discharged on 09/23/2019  Component Date Value Ref Range Status  . SARS Coronavirus 2 by RT PCR 09/23/2019 NEGATIVE  NEGATIVE Final   Comment: (NOTE) SARS-CoV-2 target nucleic acids are NOT DETECTED. The SARS-CoV-2 RNA is generally detectable in upper respiratoy specimens during the acute phase of infection. The lowest concentration of SARS-CoV-2 viral copies this assay can detect is 131 copies/mL. A negative result does not preclude SARS-Cov-2 infection and should not be used as the sole basis for treatment or other patient management decisions. A negative result may occur with  improper specimen collection/handling, submission of specimen other than nasopharyngeal swab, presence of viral mutation(s) within the areas targeted by this assay, and inadequate number of viral copies (<131 copies/mL). A negative result must be combined with clinical observations, patient history, and epidemiological information. The expected result is Negative. Fact Sheet for Patients:  PinkCheek.be Fact Sheet for Healthcare Providers:  GravelBags.it This test is not yet ap                          proved or cleared by the [place] and  has been authorized for detection and/or diagnosis of SARS-CoV-2 by FDA under an Emergency Use Authorization (EUA). This EUA will remain  in effect (meaning this test can be used) for the duration of the COVID-19 declaration under  Section 564(b)(1) of the Act, 21 U.S.C. section 360bbb-3(b)(1), unless the authorization is terminated or revoked sooner.   . Influenza A by PCR 09/23/2019 NEGATIVE  NEGATIVE Final  . Influenza B by PCR 09/23/2019 NEGATIVE  NEGATIVE Final   Comment: (NOTE) The Xpert Xpress SARS-CoV-2/FLU/RSV assay is intended as an aid in  the diagnosis of influenza from Nasopharyngeal swab specimens and  should not be used as a sole basis for treatment. Nasal washings and  aspirates are unacceptable for Xpert Xpress SARS-CoV-2/FLU/RSV  testing. Fact Sheet for Patients: PinkCheek.be Fact Sheet for Healthcare Providers: GravelBags.it This test is not yet approved or cleared by the [place] FDA and  has been authorized for detection and/or diagnosis of SARS-CoV-2 by  FDA under an Emergency Use Authorization (EUA). This EUA will remain  in effect (meaning this test can be used) for the duration of the  Covid-19 declaration under Section 564(b)(1) of the Act, 21  U.S.C. section 360bbb-3(b)(1), unless the authorization is  terminated or revoked. Performed at [facility], [place] [address] [place], [place] [address]   . Prothrombin Time 09/23/2019 13.1  11.4 - 15.2 seconds Final  . INR 09/23/2019 1.0  0.8 - 1.2 Final   Comment: (NOTE) INR goal varies based on device and disease states. Performed at [facility], [place] [address] [place], [place] [address]   . Sodium 09/23/2019 141  135 - 145 mmol/L Final  . Potassium 09/23/2019 3.4* 3.5 - 5.1 mmol/L Final  . Chloride 09/23/2019 103  98 - 111 mmol/L Final  . CO2 09/23/2019 23  22 - 32 mmol/L Final  . Glucose, Bld 09/23/2019 134* 70 - 99 mg/dL Final   Glucose reference range applies only to samples taken after fasting for at least 8 hours.  . BUN 09/23/2019 19  8 - 23 mg/dL Final  . Creatinine, Ser 09/23/2019 0.76  0.44 - 1.00 mg/dL Final  . Calcium  09/23/2019 10.0  8.9 - 10.3 mg/dL Final  . GFR calc non Af Amer 09/23/2019 >60  >60 mL/min Final  . GFR calc Af Amer 09/23/2019 >60  >60 mL/min Final  . Anion gap 09/23/2019 15  5 - 15 Final   Performed at [facility], [place] [address], [place], [place] [address]  . WBC 09/23/2019 17.3* 4.0 - 10.5 K/uL Final  . RBC 09/23/2019 4.58  3.87 - 5.11 MIL/uL Final  . Hemoglobin 09/23/2019 14.3  12.0 - 15.0 g/dL Final  . HCT 09/23/2019 42.2  36.0 - 46.0 % Final  . MCV 09/23/2019 92.1  80.0 - 100.0 fL Final  . MCH 09/23/2019 31.2  26.0 - 34.0 pg Final  . MCHC 09/23/2019 33.9  30.0 - 36.0 g/dL Final  . RDW 09/23/2019 12.6  11.5 - 15.5 % Final  . Platelets 09/23/2019 225  150 - 400 K/uL Final  . nRBC 09/23/2019 0.0  0.0 - 0.2 % Final   Performed at [facility], [place] [address], [place], [place] [address]  Appointment on 08/22/2019  Component Date Value Ref Range Status  . TSH 08/22/2019 2.70  0.40 - 4.50 mIU/L Final  . Cholesterol 08/22/2019 142  <200 mg/dL Final  . HDL 08/22/2019 49* > OR = 50 mg/dL Final  .  Triglycerides 08/22/2019 152* <150 mg/dL Final  . LDL Cholesterol (Calc) 08/22/2019 70  mg/dL (calc) Final   Comment: Reference range: <100 . Desirable range <100 mg/dL for primary prevention;   <70 mg/dL for patients with CHD or diabetic patients  with > or = 2 CHD risk factors. Marland Kitchen LDL-C is now calculated using the Martin-Hopkins  calculation, which is a validated novel method providing  better accuracy than the Friedewald equation in the  estimation of LDL-C.  Cresenciano Genre et al. Annamaria Helling. [id number]): 2061-2068  (http://education.Quest[employer].com/faq/FAQ164)   . Total CHOL/HDL Ratio 08/22/2019 2.9  <5.0 (calc) Final  . Non-HDL Cholesterol (Calc) 08/22/2019 93  <130 mg/dL (calc) Final   Comment: For patients with diabetes plus 1 major ASCVD risk  factor, treating to a non-HDL-C goal of <100 mg/dL  (LDL-C of <70 mg/dL) is considered a therapeutic    option.   . Glucose, Bld 08/22/2019 98  65 - 99 mg/dL Final   Comment: .            Fasting reference interval .   . BUN 08/22/2019 14  7 - 25 mg/dL Final  . Creat 08/22/2019 0.85  0.60 - 0.88 mg/dL Final   Comment: For patients >84 years of age, the reference limit for Creatinine is approximately 13% higher for people identified as African-American. .   . GFR, Est Non African American 08/22/2019 63  > OR = 60 mL/min/1.49m2 Final  . GFR, Est African American 08/22/2019 73  > OR = 60 mL/min/1.43m2 Final  . BUN/Creatinine Ratio [id number] NOT APPLICABLE  6 - 22 (calc) Final  . Sodium 08/22/2019 142  135 - 146 mmol/L Final  . Potassium 08/22/2019 4.3  3.5 - 5.3 mmol/L Final  . Chloride 08/22/2019 102  98 - 110 mmol/L Final  . CO2 08/22/2019 31  20 - 32 mmol/L Final  . Calcium 08/22/2019 10.1  8.6 - 10.4 mg/dL Final  . Total Protein 08/22/2019 6.9  6.1 - 8.1 g/dL Final  . Albumin 08/22/2019 4.7  3.6 - 5.1 g/dL Final  . Globulin 08/22/2019 2.2  1.9 - 3.7 g/dL (calc) Final  . AG Ratio 08/22/2019 2.1  1.0 - 2.5 (calc) Final  . Total Bilirubin 08/22/2019 0.9  0.2 - 1.2 mg/dL Final  . Alkaline phosphatase (APISO) 08/22/2019 53  37 - 153 U/L Final  . AST 08/22/2019 16  10 - 35 U/L Final  . ALT 08/22/2019 16  6 - 29 U/L Final  . WBC 08/22/2019 6.9  3.8 - 10.8 Thousand/uL Final  . RBC 08/22/2019 4.70  3.80 - 5.10 Million/uL Final  . Hemoglobin 08/22/2019 14.6  11.7 - 15.5 g/dL Final  . HCT 08/22/2019 43.2  35.0 - 45.0 % Final  . MCV 08/22/2019 91.9  80.0 - 100.0 fL Final  . MCH 08/22/2019 31.1  27.0 - 33.0 pg Final  . MCHC 08/22/2019 33.8  32.0 - 36.0 g/dL Final  . RDW 08/22/2019 12.1  11.0 - 15.0 % Final  . Platelets 08/22/2019 284  140 - 400 Thousand/uL Final  . MPV 08/22/2019 10.2  7.5 - 12.5 fL Final  . Neutro Abs 08/22/2019 3,505  1,500 - 7,800 cells/uL Final  . Lymphs Abs 08/22/2019 2,422  850 - 3,900 cells/uL Final  . Absolute Monocytes 08/22/2019 697  200 - 950 cells/uL Final   . Eosinophils Absolute 08/22/2019 207  15 - 500 cells/uL Final  . Basophils Absolute 08/22/2019 69  0 - 200 cells/uL Final  . Neutrophils Relative % 08/22/2019  50.8  % Final  . Total Lymphocyte 08/22/2019 35.1  % Final  . Monocytes Relative 08/22/2019 10.1  % Final  . Eosinophils Relative 08/22/2019 3.0  % Final  . Basophils Relative 08/22/2019 1.0  % Final  . Hgb A1c MFr Bld 08/22/2019 5.5  <5.7 % of total Hgb Final   Comment: For the purpose of screening for the presence of diabetes: . <5.7%       Consistent with the absence of diabetes 5.7-6.4%    Consistent with increased risk for diabetes             (prediabetes) > or =6.5%  Consistent with diabetes . This assay result is consistent with a decreased risk of diabetes. . Currently, no consensus exists regarding use of hemoglobin A1c for diagnosis of diabetes in children. . According to American Diabetes Association (ADA) guidelines, hemoglobin A1c <7.0% represents optimal control in non-pregnant diabetic patients. Different metrics may apply to specific patient populations.  Standards of Medical Care in Diabetes(ADA). .   . Mean Plasma Glucose 08/22/2019 111  (calc) Final  . eAG (mmol/L) 08/22/2019 6.2  (calc) Final     X-Rays:DG Shoulder Right  Result Date: 09/23/2019 CLINICAL DATA:  Pain status post fall EXAM: RIGHT SHOULDER - 2+ VIEW COMPARISON:  None. FINDINGS: There is an anterior inferior glenohumeral dislocation. There is a acute displaced fracture fragment measuring approximately 3.7 cm likely rising from the humeral head. The osseous mineralization is decreased. IMPRESSION: Acute fracture dislocation as above. Electronically Signed   By: Constance Holster M.D.   On: 09/23/2019 15:39   CT HEAD WO CONTRAST  Result Date: 09/23/2019 CLINICAL DATA:  86 year old female with posttraumatic headache. EXAM: CT HEAD WITHOUT CONTRAST CT MAXILLOFACIAL WITHOUT CONTRAST CT CERVICAL SPINE WITHOUT CONTRAST TECHNIQUE:  Multidetector CT imaging of the head, cervical spine, and maxillofacial structures were performed using the standard protocol without intravenous contrast. Multiplanar CT image reconstructions of the cervical spine and maxillofacial structures were also generated. COMPARISON:  None. FINDINGS: CT HEAD FINDINGS Brain: There is mild age-related atrophy and moderate chronic microvascular ischemic changes. There is no acute intracranial hemorrhage. There is a 2.6 x 2.0 cm partially calcified dural-based mass along the right parietal calvarium most consistent with a meningioma. There is associated mild mass effect on the adjacent brain cortex. No midline shift. No extra-axial fluid collection. Vascular: No hyperdense vessel or unexpected calcification. Skull: Normal. Negative for fracture or focal lesion. Other: Mild left periorbital contusion. CT MAXILLOFACIAL FINDINGS Osseous: No fracture or mandibular dislocation. No destructive process. Orbits: Negative. No traumatic or inflammatory finding. Sinuses: Clear. Soft tissues: Mild left periorbital contusion. Focal soft tissue nodularity along the left side of the nose. Clinical correlation is recommended. No drainable fluid collection or hematoma. CT CERVICAL SPINE FINDINGS Alignment: No acute subluxation. There is straightening of normal cervical lordosis which may be positional or due to muscle spasm. Skull base and vertebrae: No acute fracture. Osteopenia. Soft tissues and spinal canal: No prevertebral fluid or swelling. No visible canal hematoma. Disc levels: Multilevel degenerative changes with endplate irregularity and disc space narrowing and spurring. Upper chest: Negative. Other: Ill-defined bilateral thyroid nodules. Further evaluation with ultrasound on a nonemergent basis recommended. IMPRESSION: 1. No acute intracranial pathology. 2. Right parietal meningioma. 3. No acute/traumatic cervical spine pathology. 4. No acute facial bone fractures. Electronically  Signed   By: Anner Crete M.D.   On: 09/23/2019 17:06   CT CERVICAL SPINE WO CONTRAST  Result Date: 09/23/2019 CLINICAL DATA:  86 year old  female with posttraumatic headache. EXAM: CT HEAD WITHOUT CONTRAST CT MAXILLOFACIAL WITHOUT CONTRAST CT CERVICAL SPINE WITHOUT CONTRAST TECHNIQUE: Multidetector CT imaging of the head, cervical spine, and maxillofacial structures were performed using the standard protocol without intravenous contrast. Multiplanar CT image reconstructions of the cervical spine and maxillofacial structures were also generated. COMPARISON:  None. FINDINGS: CT HEAD FINDINGS Brain: There is mild age-related atrophy and moderate chronic microvascular ischemic changes. There is no acute intracranial hemorrhage. There is a 2.6 x 2.0 cm partially calcified dural-based mass along the right parietal calvarium most consistent with a meningioma. There is associated mild mass effect on the adjacent brain cortex. No midline shift. No extra-axial fluid collection. Vascular: No hyperdense vessel or unexpected calcification. Skull: Normal. Negative for fracture or focal lesion. Other: Mild left periorbital contusion. CT MAXILLOFACIAL FINDINGS Osseous: No fracture or mandibular dislocation. No destructive process. Orbits: Negative. No traumatic or inflammatory finding. Sinuses: Clear. Soft tissues: Mild left periorbital contusion. Focal soft tissue nodularity along the left side of the nose. Clinical correlation is recommended. No drainable fluid collection or hematoma. CT CERVICAL SPINE FINDINGS Alignment: No acute subluxation. There is straightening of normal cervical lordosis which may be positional or due to muscle spasm. Skull base and vertebrae: No acute fracture. Osteopenia. Soft tissues and spinal canal: No prevertebral fluid or swelling. No visible canal hematoma. Disc levels: Multilevel degenerative changes with endplate irregularity and disc space narrowing and spurring. Upper chest: Negative.  Other: Ill-defined bilateral thyroid nodules. Further evaluation with ultrasound on a nonemergent basis recommended. IMPRESSION: 1. No acute intracranial pathology. 2. Right parietal meningioma. 3. No acute/traumatic cervical spine pathology. 4. No acute facial bone fractures. Electronically Signed   By: Anner Crete M.D.   On: 09/23/2019 17:06   CT Shoulder Right Wo Contrast  Result Date: 09/23/2019 CLINICAL DATA:  Status post fall, severe pain EXAM: CT OF THE UPPER RIGHT EXTREMITY WITHOUT CONTRAST TECHNIQUE: Multidetector CT imaging of the upper right extremity was performed according to the standard protocol. COMPARISON:  None. FINDINGS: Bones/Joint/Cartilage Anterior inferior dislocation of the right glenoid. Displaced fracture of the greater tuberosity with 3 cm of posterolateral displacement located along mid glenoid. Mildly comminuted fracture of the surgical neck of right proximal humerus. No other acute fracture or dislocation. No aggressive osseous lesion. Mild arthropathy of the acromioclavicular joint. Small joint effusion. Ligaments Ligaments are suboptimally evaluated by CT. Muscles and Tendons Muscles are normal. No muscle atrophy. No intramuscular fluid collection or hematoma. Soft tissue Soft tissue edema in the axillary fat. Visualized right lung is clear. No soft tissue mass. IMPRESSION: 1. Anterior inferior dislocation of the right glenoid. Displaced fracture of the greater tuberosity with 3 cm of posterolateral displacement which is located along the inferior aspect of the glenoid. Mildly comminuted fracture of the surgical neck of right proximal humerus. Electronically Signed   By: Kathreen Devoid   On: 09/23/2019 17:08   DG Shoulder Right Port  Result Date: 09/27/2019 CLINICAL DATA:  Status post reverse right total shoulder arthroplasty. EXAM: PORTABLE RIGHT SHOULDER COMPARISON:  09/23/2019 FINDINGS: Sequelae of interval reverse right total shoulder arthroplasty are identified. There  is a 4 cm bone fragment along the superior aspect of the proximal humerus component which might reflect a change in position of the previously demonstrated greater tuberosity fracture fragment although a new fracture is not excluded. The prosthetic components appear normally aligned on this single image. Skin staples are in place, and postoperative gas is noted in the soft tissues. IMPRESSION: Interval reverse  right total shoulder arthroplasty with bone fragment along the proximal aspect of the humerus. Electronically Signed   By: Logan Bores M.D.   On: 09/27/2019 15:13   DG Shoulder Right Portable  Result Date: 09/23/2019 CLINICAL DATA:  Post reduction radiographs EXAM: PORTABLE RIGHT SHOULDER COMPARISON:  X-ray from the same day FINDINGS: There is improved osseous alignment status post reduction. Again noted is a large displaced fracture fragment likely arising from the greater tuberosity. There is a nondisplaced fracture through the surgical neck of the humerus which is better visualized on prior CT. IMPRESSION: 1. Improved alignment status post reduction. 2. Again noted is a large displaced fracture fragment and a nondisplaced fracture through the surgical neck of the humerus. Electronically Signed   By: Constance Holster M.D.   On: 09/23/2019 18:36   CT Maxillofacial Wo Contrast  Result Date: 09/23/2019 CLINICAL DATA:  86 year old female with posttraumatic headache. EXAM: CT HEAD WITHOUT CONTRAST CT MAXILLOFACIAL WITHOUT CONTRAST CT CERVICAL SPINE WITHOUT CONTRAST TECHNIQUE: Multidetector CT imaging of the head, cervical spine, and maxillofacial structures were performed using the standard protocol without intravenous contrast. Multiplanar CT image reconstructions of the cervical spine and maxillofacial structures were also generated. COMPARISON:  None. FINDINGS: CT HEAD FINDINGS Brain: There is mild age-related atrophy and moderate chronic microvascular ischemic changes. There is no acute  intracranial hemorrhage. There is a 2.6 x 2.0 cm partially calcified dural-based mass along the right parietal calvarium most consistent with a meningioma. There is associated mild mass effect on the adjacent brain cortex. No midline shift. No extra-axial fluid collection. Vascular: No hyperdense vessel or unexpected calcification. Skull: Normal. Negative for fracture or focal lesion. Other: Mild left periorbital contusion. CT MAXILLOFACIAL FINDINGS Osseous: No fracture or mandibular dislocation. No destructive process. Orbits: Negative. No traumatic or inflammatory finding. Sinuses: Clear. Soft tissues: Mild left periorbital contusion. Focal soft tissue nodularity along the left side of the nose. Clinical correlation is recommended. No drainable fluid collection or hematoma. CT CERVICAL SPINE FINDINGS Alignment: No acute subluxation. There is straightening of normal cervical lordosis which may be positional or due to muscle spasm. Skull base and vertebrae: No acute fracture. Osteopenia. Soft tissues and spinal canal: No prevertebral fluid or swelling. No visible canal hematoma. Disc levels: Multilevel degenerative changes with endplate irregularity and disc space narrowing and spurring. Upper chest: Negative. Other: Ill-defined bilateral thyroid nodules. Further evaluation with ultrasound on a nonemergent basis recommended. IMPRESSION: 1. No acute intracranial pathology. 2. Right parietal meningioma. 3. No acute/traumatic cervical spine pathology. 4. No acute facial bone fractures. Electronically Signed   By: Anner Crete M.D.   On: 09/23/2019 17:06    EKG: Orders placed or performed during the hospital encounter of 09/23/19  . EKG 12-Lead  . EKG 12-Lead  . EKG     Hospital Course: Carla Sparks is a 86 y.o. who was admitted to Hospital. They were brought to the operating room on 09/27/2019 and underwent Procedure(s): Moroni.  Patient tolerated the procedure well and was  later transferred to the recovery room and then to the orthopaedic floor for postoperative care.  They were given PO and IV analgesics for pain control following their surgery.  They were given 24 hours of postoperative antibiotics of  Anti-infectives (From admission, onward)   Start     Dose/Rate Route Frequency Ordered Stop   09/27/19 1830  ceFAZolin (ANCEF) IVPB 1 g/50 mL premix     1 g 100 mL/hr over 30 Minutes Intravenous Every 6  hours 09/27/19 1627 09/28/19 0626   09/27/19 0815  ceFAZolin (ANCEF) IVPB 2g/100 mL premix     2 g 200 mL/hr over 30 Minutes Intravenous On call to O.R. 09/27/19 0800 09/27/19 1227   09/27/19 0807  ceFAZolin (ANCEF) 2-4 GM/100ML-% IVPB    Note to Pharmacy: Marga Melnick   : cabinet override      09/27/19 0807 09/27/19 1241     and started on DVT prophylaxis in the form of Aspirin.   PT and OT were ordered for total joint protocol.  Discharge planning consulted to help with postop disposition and equipment needs.  Patient had a good night on the evening of surgery.  They started to get up OOB with therapy on day one. Incision was healing well.  Patient was seen in rounds and was ready to go home.   Diet: Regular diet Activity:NWB Follow-up:in 2 weeks Disposition - Home Discharged Condition: good   Discharge Instructions    Call MD / Call 911   Complete by: As directed    If you experience chest pain or shortness of breath, CALL 911 and be transported to the hospital emergency room.  If you develope a fever above 101 F, pus (white drainage) or increased drainage or redness at the wound, or calf pain, call your surgeon's office.   Constipation Prevention   Complete by: As directed    Drink plenty of fluids.  Prune juice may be helpful.  You may use a stool softener, such as Colace (over the counter) 100 mg twice a day.  Use MiraLax (over the counter) for constipation as needed.   Diet - low sodium heart healthy   Complete by: As directed    Increase  activity slowly as tolerated   Complete by: As directed      Allergies as of 09/28/2019      Reactions   Eggs Or Egg-derived Products    Percocet [oxycodone-acetaminophen] Rash   And darvocet      Medication List    TAKE these medications   amLODipine 5 MG tablet Commonly known as: NORVASC Take 1 tablet (5 mg total) by mouth daily.   diclofenac Sodium 1 % Gel Commonly known as: VOLTAREN Apply 2 g topically 3 (three) times daily as needed. For knee and hand arthritis pain   Flax Seed Oil 1000 MG Caps Take 1,000 mg by mouth daily.   hydrochlorothiazide 25 MG tablet Commonly known as: HYDRODIURIL Take 1 tablet (25 mg total) by mouth daily.   ibuprofen 600 MG tablet Commonly known as: ADVIL Take 1 tablet (600 mg total) by mouth every 6 (six) hours as needed for up to 30 doses for mild pain or moderate pain.   lisinopril 40 MG tablet Commonly known as: ZESTRIL Take 1 tablet (40 mg total) by mouth daily.   methocarbamol 500 MG tablet Commonly known as: Robaxin Take 1 tablet (500 mg total) by mouth every 6 (six) hours as needed for muscle spasms.   multivitamin with minerals Tabs tablet Take 1 tablet by mouth daily.   ondansetron 4 MG disintegrating tablet Commonly known as: Zofran ODT Take 1 tablet (4 mg total) by mouth every 8 (eight) hours as needed.   oxyCODONE 5 MG immediate release tablet Commonly known as: Roxicodone Take 1 tablet (5 mg total) by mouth every 6 (six) hours as needed for up to 10 doses for breakthrough pain.   simvastatin 40 MG tablet Commonly known as: ZOCOR Take 1 tablet (40 mg total) by  mouth daily. What changed: when to take this   traZODone 100 MG tablet Commonly known as: DESYREL Take 1 tablet (100 mg total) by mouth at bedtime. What changed:   when to take this  reasons to take this   Vitamin D3 125 MCG (5000 UT) Caps Take 5,000 Units by mouth daily.      Follow-up Information    Nicholes Stairs, MD.   Specialty:  Orthopedic Surgery Contact information: [address] [place] [address] [id number]           Signed: Geralynn Rile, MD Orthopaedic Surgery 10/11/2019, 2:32 PM

## 2019-10-12 DIAGNOSIS — M25561 Pain in right knee: Secondary | ICD-10-CM | POA: Diagnosis not present

## 2019-10-12 DIAGNOSIS — M25461 Effusion, right knee: Secondary | ICD-10-CM | POA: Diagnosis not present

## 2019-10-12 DIAGNOSIS — M25462 Effusion, left knee: Secondary | ICD-10-CM | POA: Diagnosis not present

## 2019-10-12 DIAGNOSIS — G8929 Other chronic pain: Secondary | ICD-10-CM | POA: Diagnosis not present

## 2019-10-12 DIAGNOSIS — M25562 Pain in left knee: Secondary | ICD-10-CM | POA: Diagnosis not present

## 2019-10-12 DIAGNOSIS — M170 Bilateral primary osteoarthritis of knee: Secondary | ICD-10-CM | POA: Diagnosis not present

## 2019-10-24 DIAGNOSIS — M6281 Muscle weakness (generalized): Secondary | ICD-10-CM | POA: Diagnosis not present

## 2019-10-24 DIAGNOSIS — M25511 Pain in right shoulder: Secondary | ICD-10-CM | POA: Diagnosis not present

## 2019-10-24 DIAGNOSIS — G8929 Other chronic pain: Secondary | ICD-10-CM | POA: Diagnosis not present

## 2019-10-24 DIAGNOSIS — M25611 Stiffness of right shoulder, not elsewhere classified: Secondary | ICD-10-CM | POA: Diagnosis not present

## 2019-10-30 DIAGNOSIS — M25511 Pain in right shoulder: Secondary | ICD-10-CM | POA: Diagnosis not present

## 2019-10-30 DIAGNOSIS — G8929 Other chronic pain: Secondary | ICD-10-CM | POA: Diagnosis not present

## 2019-11-01 DIAGNOSIS — G8929 Other chronic pain: Secondary | ICD-10-CM | POA: Diagnosis not present

## 2019-11-01 DIAGNOSIS — M25511 Pain in right shoulder: Secondary | ICD-10-CM | POA: Diagnosis not present

## 2019-11-02 ENCOUNTER — Other Ambulatory Visit: Payer: Self-pay | Admitting: Family Medicine

## 2019-11-02 DIAGNOSIS — M170 Bilateral primary osteoarthritis of knee: Secondary | ICD-10-CM

## 2019-11-02 DIAGNOSIS — M19041 Primary osteoarthritis, right hand: Secondary | ICD-10-CM

## 2019-11-06 DIAGNOSIS — G8929 Other chronic pain: Secondary | ICD-10-CM | POA: Diagnosis not present

## 2019-11-06 DIAGNOSIS — M25511 Pain in right shoulder: Secondary | ICD-10-CM | POA: Diagnosis not present

## 2019-11-09 DIAGNOSIS — G8929 Other chronic pain: Secondary | ICD-10-CM | POA: Diagnosis not present

## 2019-11-09 DIAGNOSIS — M25511 Pain in right shoulder: Secondary | ICD-10-CM | POA: Diagnosis not present

## 2019-11-12 DIAGNOSIS — Z4789 Encounter for other orthopedic aftercare: Secondary | ICD-10-CM | POA: Diagnosis not present

## 2019-11-13 DIAGNOSIS — M25511 Pain in right shoulder: Secondary | ICD-10-CM | POA: Diagnosis not present

## 2019-11-13 DIAGNOSIS — G8929 Other chronic pain: Secondary | ICD-10-CM | POA: Diagnosis not present

## 2019-11-15 DIAGNOSIS — G8929 Other chronic pain: Secondary | ICD-10-CM | POA: Diagnosis not present

## 2019-11-15 DIAGNOSIS — M25511 Pain in right shoulder: Secondary | ICD-10-CM | POA: Diagnosis not present

## 2019-11-20 DIAGNOSIS — M25511 Pain in right shoulder: Secondary | ICD-10-CM | POA: Diagnosis not present

## 2019-11-20 DIAGNOSIS — G8929 Other chronic pain: Secondary | ICD-10-CM | POA: Diagnosis not present

## 2019-11-26 DIAGNOSIS — M25511 Pain in right shoulder: Secondary | ICD-10-CM | POA: Diagnosis not present

## 2019-11-26 DIAGNOSIS — M25611 Stiffness of right shoulder, not elsewhere classified: Secondary | ICD-10-CM | POA: Diagnosis not present

## 2019-11-26 DIAGNOSIS — G8929 Other chronic pain: Secondary | ICD-10-CM | POA: Diagnosis not present

## 2019-11-26 DIAGNOSIS — M6281 Muscle weakness (generalized): Secondary | ICD-10-CM | POA: Diagnosis not present

## 2019-11-29 DIAGNOSIS — M6281 Muscle weakness (generalized): Secondary | ICD-10-CM | POA: Diagnosis not present

## 2019-11-29 DIAGNOSIS — M25511 Pain in right shoulder: Secondary | ICD-10-CM | POA: Diagnosis not present

## 2019-11-29 DIAGNOSIS — M25611 Stiffness of right shoulder, not elsewhere classified: Secondary | ICD-10-CM | POA: Diagnosis not present

## 2019-11-29 DIAGNOSIS — G8929 Other chronic pain: Secondary | ICD-10-CM | POA: Diagnosis not present

## 2019-12-04 DIAGNOSIS — M6281 Muscle weakness (generalized): Secondary | ICD-10-CM | POA: Diagnosis not present

## 2019-12-04 DIAGNOSIS — M25511 Pain in right shoulder: Secondary | ICD-10-CM | POA: Diagnosis not present

## 2019-12-04 DIAGNOSIS — G8929 Other chronic pain: Secondary | ICD-10-CM | POA: Diagnosis not present

## 2019-12-04 DIAGNOSIS — M25611 Stiffness of right shoulder, not elsewhere classified: Secondary | ICD-10-CM | POA: Diagnosis not present

## 2019-12-06 DIAGNOSIS — M6281 Muscle weakness (generalized): Secondary | ICD-10-CM | POA: Diagnosis not present

## 2019-12-06 DIAGNOSIS — G8929 Other chronic pain: Secondary | ICD-10-CM | POA: Diagnosis not present

## 2019-12-06 DIAGNOSIS — M25511 Pain in right shoulder: Secondary | ICD-10-CM | POA: Diagnosis not present

## 2019-12-06 DIAGNOSIS — M25611 Stiffness of right shoulder, not elsewhere classified: Secondary | ICD-10-CM | POA: Diagnosis not present

## 2019-12-11 DIAGNOSIS — G8929 Other chronic pain: Secondary | ICD-10-CM | POA: Diagnosis not present

## 2019-12-11 DIAGNOSIS — M25611 Stiffness of right shoulder, not elsewhere classified: Secondary | ICD-10-CM | POA: Diagnosis not present

## 2019-12-11 DIAGNOSIS — M6281 Muscle weakness (generalized): Secondary | ICD-10-CM | POA: Diagnosis not present

## 2019-12-11 DIAGNOSIS — M25511 Pain in right shoulder: Secondary | ICD-10-CM | POA: Diagnosis not present

## 2019-12-14 DIAGNOSIS — G8929 Other chronic pain: Secondary | ICD-10-CM | POA: Diagnosis not present

## 2019-12-14 DIAGNOSIS — M25511 Pain in right shoulder: Secondary | ICD-10-CM | POA: Diagnosis not present

## 2019-12-14 DIAGNOSIS — M25611 Stiffness of right shoulder, not elsewhere classified: Secondary | ICD-10-CM | POA: Diagnosis not present

## 2019-12-14 DIAGNOSIS — M6281 Muscle weakness (generalized): Secondary | ICD-10-CM | POA: Diagnosis not present

## 2019-12-18 DIAGNOSIS — G8929 Other chronic pain: Secondary | ICD-10-CM | POA: Diagnosis not present

## 2019-12-18 DIAGNOSIS — M25611 Stiffness of right shoulder, not elsewhere classified: Secondary | ICD-10-CM | POA: Diagnosis not present

## 2019-12-18 DIAGNOSIS — M6281 Muscle weakness (generalized): Secondary | ICD-10-CM | POA: Diagnosis not present

## 2019-12-18 DIAGNOSIS — M25511 Pain in right shoulder: Secondary | ICD-10-CM | POA: Diagnosis not present

## 2019-12-20 DIAGNOSIS — M6281 Muscle weakness (generalized): Secondary | ICD-10-CM | POA: Diagnosis not present

## 2019-12-20 DIAGNOSIS — G8929 Other chronic pain: Secondary | ICD-10-CM | POA: Diagnosis not present

## 2019-12-20 DIAGNOSIS — M25511 Pain in right shoulder: Secondary | ICD-10-CM | POA: Diagnosis not present

## 2019-12-20 DIAGNOSIS — M25611 Stiffness of right shoulder, not elsewhere classified: Secondary | ICD-10-CM | POA: Diagnosis not present

## 2019-12-25 DIAGNOSIS — Z4789 Encounter for other orthopedic aftercare: Secondary | ICD-10-CM | POA: Diagnosis not present

## 2020-02-26 ENCOUNTER — Ambulatory Visit: Payer: Medicare PPO | Admitting: Family Medicine

## 2020-02-26 ENCOUNTER — Encounter: Payer: Self-pay | Admitting: Family Medicine

## 2020-02-26 ENCOUNTER — Other Ambulatory Visit: Payer: Self-pay

## 2020-02-26 VITALS — BP 112/46 | HR 69 | Temp 97.3°F | Resp 16 | Ht 65.0 in | Wt 150.0 lb

## 2020-02-26 DIAGNOSIS — F5101 Primary insomnia: Secondary | ICD-10-CM | POA: Diagnosis not present

## 2020-02-26 DIAGNOSIS — I10 Essential (primary) hypertension: Secondary | ICD-10-CM | POA: Diagnosis not present

## 2020-02-26 DIAGNOSIS — Z96611 Presence of right artificial shoulder joint: Secondary | ICD-10-CM

## 2020-02-26 NOTE — Patient Instructions (Addendum)
Thank you for coming to the office today.  Reduce Hydrochlorothiazide HCTZ from 25mg  daily. May reduce down to HALF pill 12.5mg  daily, use a pill cutter  If doing well on half dose or you prefer to not cut it, you may try skipping this pill for now and we can see how BP responds If you get swelling in legs we may need to restart it.  DUE for FASTING BLOOD WORK (no food or drink after midnight before the lab appointment, only water or coffee without cream/sugar on the morning of)  SCHEDULE "Lab Only" visit in the morning at the clinic for lab draw in 6 MONTHS   - Make sure Lab Only appointment is at about 1 week before your next appointment, so that results will be available  For Lab Results, once available within 2-3 days of blood draw, you can can log in to MyChart online to view your results and a brief explanation. Also, we can discuss results at next follow-up visit.   Please schedule a Follow-up Appointment to: Return in about 6 months (around 08/28/2020) for Annual Physical.  If you have any other questions or concerns, please feel free to call the office or send a message through [place]. You may also schedule an earlier appointment if necessary.  Additionally, you may be receiving a survey about your experience at our office within a few days to 1 week by e-mail or mail. We value your feedback.  Nobie Putnam, DO [place]

## 2020-02-26 NOTE — Progress Notes (Signed)
Subjective:    Patient ID: Carla Sparks, female    DOB: [birth year], 86 y.o.   MRN: [id number]  Carla Sparks is a 86 y.o. female presenting on 02/26/2020 for Hypertension   HPI   CHRONIC HTN: Reportsno new concerns. Home BP log is normal. However admits some lower readings lately Current Meds -Amlodipine 5mg  daily, HCTZ 25mg  daily, Lisinopril 40mg  daily Reports good compliance, took meds today. Tolerating well, w/o complaints. Denies CP, dyspnea, HA, edema, dizziness / lightheadedness  Insomnia, Chronic Continues on Trazodone 100mg  nightly, doing well, helps her initiate sleep and helps maintain and fall back asleep. No new concerns. Not taking it PRN.  Osteoarthritis, multiple joints / knees bilateral Followed by [facility] Dr Roland Rack and Dr Candelaria Stagers, has had prior recurrent steroid cortisone knee injections, now limited relief, she wants to avoid surgery, she has chronic bilateral knee pain stiffness and swelling moderate with arthritis, affects her if increased activity at times or if rest can get more stiff.  Now s/p gel viscus injections in knees, mixed results Recent update S/p R shoulder reverse arthroplasty, Dr Stann Mainland, 09/27/19 Doing PT regularly   Health Maintenance: Declines Flu vaccine this season UTD [place], 11/24/19 and 12/08/19   Depression screen [facility] 2/9 02/26/2020 08/29/2019 08/23/2018  Decreased Interest 0 0 0  Down, Depressed, Hopeless 0 0 0  PHQ - 2 Score 0 0 0    Social History   Tobacco Use  . Smoking status: Never Smoker  . Smokeless tobacco: Never Used  Vaping Use  . Vaping Use: Never used  Substance Use Topics  . Alcohol use: No  . Drug use: No   Past Surgical History:  Procedure Laterality Date  . ABDOMINAL HYSTERECTOMY    . CHOLECYSTECTOMY    . External hemorrhoid surgery    . GALLBLADDER SURGERY    . REVERSE SHOULDER ARTHROPLASTY Right 09/27/2019   Procedure: REVERSE SHOULDER ARTHROPLASTY;  Surgeon: Nicholes Stairs, MD;  Location: [facility];  Service: Orthopedics;  Laterality: Right;  2 hrs RNFA  . TONSILLECTOMY    . TOTAL ABDOMINAL HYSTERECTOMY W/ BILATERAL SALPINGOOPHORECTOMY    . VULVECTOMY     several surgeries     Review of Systems Per HPI unless specifically indicated above     Objective:    BP (!) 112/46   Pulse 69   Temp (!) 97.3 F (36.3 C) (Temporal)   Resp 16   Ht 5\' 5"  (1.651 m)   Wt 150 lb (68 kg)   LMP  (LMP Unknown)   SpO2 99%   BMI 24.96 kg/m   Wt Readings from Last 3 Encounters:  02/26/20 150 lb (68 kg)  09/27/19 155 lb (70.3 kg)  09/23/19 160 lb 11.5 oz (72.9 kg)    Physical Exam Vitals and nursing note reviewed.  Constitutional:      General: She is not in acute distress.    Appearance: She is well-developed. She is not diaphoretic.     Comments: Well-appearing, comfortable, cooperative  HENT:     Head: Normocephalic and atraumatic.  Eyes:     General:        Right eye: No discharge.        Left eye: No discharge.     Conjunctiva/sclera: Conjunctivae normal.  Neck:     Thyroid: No thyromegaly.  Cardiovascular:     Rate and Rhythm: Normal rate and regular rhythm.     Heart sounds: Normal heart sounds. No murmur heard.   Pulmonary:  Effort: Pulmonary effort is normal. No respiratory distress.     Breath sounds: Normal breath sounds. No wheezing or rales.  Musculoskeletal:        General: Normal range of motion.     Cervical back: Normal range of motion and neck supple.     Right lower leg: No edema.     Left lower leg: No edema.  Lymphadenopathy:     Cervical: No cervical adenopathy.  Skin:    General: Skin is warm and dry.     Findings: No erythema or rash.  Neurological:     Mental Status: She is alert and oriented to person, place, and time.  Psychiatric:        Behavior: Behavior normal.     Comments: Well groomed, good eye contact, normal speech and thoughts      Results for orders placed or performed during the hospital  encounter of 09/26/19  SARS CORONAVIRUS 2 (TAT 6-24 HRS) Nasopharyngeal Nasopharyngeal Swab   Specimen: Nasopharyngeal Swab  Result Value Ref Range   SARS Coronavirus 2 NEGATIVE NEGATIVE      Assessment & Plan:   Problem List Items Addressed This Visit    S/P reverse total shoulder arthroplasty, right    S/p R shoulder arthroplasty surgery in 09/2019 Improving, gradual process On PT regularly Followed by Orthopedics      Insomnia    Improved chronic insomnia on trazodone  instead of PRN doing well on nightly - No history of Depression or Anxiety endorsed  Plan: 1. Continue current dose Trazodone 100mg  nightly      Essential hypertension, benign - Primary    Well controlled HTN, lower normal readings, some weight loss Home readings reviewed Without known complication. No known CAD/MI Off BB Metoprolol  Plan:  1. REDUCE HCTZ from 25 to 12.5mg  daily - use pill cutter reduce current dose, notify office in future if need new rx HCTZ 12.5mg  or if prefer to trial OFF med is an option, based on no edema and low normal BP - Continue current BP regimen Amlodipine 5mg , Lisinopril 40mg  2. Encourage improved lifestyle - low sodium diet, regular exercise 3. Continue monitor BP outside office, bring readings to next visit, if persistently >140/90 or new symptoms notify office sooner 4. Follow-up 6 months      Relevant Medications   hydrochlorothiazide (HYDRODIURIL) 25 MG tablet        No orders of the defined types were placed in this encounter.      Follow up plan: Return in about 6 months (around 08/28/2020) for Annual Physical.  Future lab orders for 07/2020  Nobie Putnam, [facility] 02/26/2020, 11:28 AM

## 2020-02-27 ENCOUNTER — Other Ambulatory Visit: Payer: Self-pay | Admitting: Family Medicine

## 2020-02-27 DIAGNOSIS — Z0000 Encounter for general adult medical examination without abnormal findings: Secondary | ICD-10-CM

## 2020-02-27 DIAGNOSIS — E782 Mixed hyperlipidemia: Secondary | ICD-10-CM

## 2020-02-27 DIAGNOSIS — R7309 Other abnormal glucose: Secondary | ICD-10-CM

## 2020-02-27 DIAGNOSIS — Z96611 Presence of right artificial shoulder joint: Secondary | ICD-10-CM | POA: Insufficient documentation

## 2020-02-27 DIAGNOSIS — I10 Essential (primary) hypertension: Secondary | ICD-10-CM

## 2020-02-27 NOTE — Assessment & Plan Note (Signed)
S/p R shoulder arthroplasty surgery in 09/2019 Improving, gradual process On PT regularly Followed by Orthopedics

## 2020-02-27 NOTE — Assessment & Plan Note (Signed)
Well controlled HTN, lower normal readings, some weight loss Home readings reviewed Without known complication. No known CAD/MI Off BB Metoprolol  Plan:  1. REDUCE HCTZ from 25 to 12.5mg  daily - use pill cutter reduce current dose, notify office in future if need new rx HCTZ 12.5mg  or if prefer to trial OFF med is an option, based on no edema and low normal BP - Continue current BP regimen Amlodipine 5mg , Lisinopril 40mg  2. Encourage improved lifestyle - low sodium diet, regular exercise 3. Continue monitor BP outside office, bring readings to next visit, if persistently >140/90 or new symptoms notify office sooner 4. Follow-up 6 months

## 2020-02-27 NOTE — Assessment & Plan Note (Signed)
Improved chronic insomnia on trazodone  instead of PRN doing well on nightly - No history of Depression or Anxiety endorsed  Plan: 1. Continue current dose Trazodone 100mg  nightly

## 2020-05-27 ENCOUNTER — Emergency Department
Admission: EM | Admit: 2020-05-27 | Discharge: 2020-05-27 | Discharge status: Against Medical Advice | Payer: Medicare PPO

## 2020-05-30 DIAGNOSIS — L989 Disorder of the skin and subcutaneous tissue, unspecified: Secondary | ICD-10-CM | POA: Diagnosis not present

## 2020-05-30 DIAGNOSIS — L980 Pyogenic granuloma: Secondary | ICD-10-CM | POA: Diagnosis not present

## 2020-06-06 ENCOUNTER — Other Ambulatory Visit: Payer: Self-pay | Admitting: Family Medicine

## 2020-06-06 DIAGNOSIS — I10 Essential (primary) hypertension: Secondary | ICD-10-CM

## 2020-06-06 DIAGNOSIS — E782 Mixed hyperlipidemia: Secondary | ICD-10-CM

## 2020-06-06 NOTE — Telephone Encounter (Signed)
  Please call patient to check what dose of HCTZ she is currently taking?  This was my last office visit note  REDUCE HCTZ from 25 to 12.5mg  daily - use pill cutter reduce current dose, notify office in future if need new rx HCTZ 12.5mg  or if prefer to trial OFF med is an option, based on no edema and low normal BP  This refill is for 25mg  daily. Check if she is taking half pill or if she stopped.  Thanks!  Nobie Putnam, DO [facility] 06/06/2020, 5:38 PM

## 2020-06-06 NOTE — Telephone Encounter (Signed)
Requested medication (s) are due for refill today: yes  Requested medication (s) are on the active medication list: yes  Last refill:  02/26/20 #45 3 refills   Future visit scheduled: yes in 3 months  Notes to clinic:  historical med     Requested Prescriptions  Pending Prescriptions Disp Refills   hydrochlorothiazide (HYDRODIURIL) 25 MG tablet [Pharmacy Med Name: HYDROCHLOROTHIAZIDE 25 MG Tablet] 90 tablet     Sig: TAKE 1 TABLET EVERY DAY      Cardiovascular: Diuretics - Thiazide Failed - 06/06/2020 11:06 AM      Failed - K in normal range and within 360 days    Potassium  Date Value Ref Range Status  09/23/2019 3.4 (L) 3.5 - 5.1 mmol/L Final  03/19/2014 3.8 3.5 - 5.1 mmol/L Final          Failed - Last BP in normal range    BP Readings from Last 1 Encounters:  02/26/20 (!) 112/46          Passed - Ca in normal range and within 360 days    Calcium  Date Value Ref Range Status  09/23/2019 10.0 8.9 - 10.3 mg/dL Final   Calcium, Total  Date Value Ref Range Status  03/19/2014 9.3 8.5 - 10.1 mg/dL Final          Passed - Cr in normal range and within 360 days    Creat  Date Value Ref Range Status  08/22/2019 0.85 0.60 - 0.88 mg/dL Final    Comment:    For patients >48 years of age, the reference limit for Creatinine is approximately 13% higher for people identified as African-American. .    Creatinine, Ser  Date Value Ref Range Status  09/23/2019 0.76 0.44 - 1.00 mg/dL Final          Passed - Na in normal range and within 360 days    Sodium  Date Value Ref Range Status  09/23/2019 141 135 - 145 mmol/L Final  04/30/2015 141 134 - 144 mmol/L Final    Comment:    **Effective May 12, 2015 the reference interval**   for Sodium, Serum will be changing to:                                             136 - 144   03/19/2014 140 136 - 145 mmol/L Final          Passed - Valid encounter within last 6 months    Recent Outpatient Visits           3  months ago Essential hypertension, benign   [facility] [place], Devonne Doughty, DO   9 months ago Annual physical exam   [place], DO   1 year ago Elevated hemoglobin A1c   [facility] Olin Hauser, DO   1 year ago Annual physical exam   [facility] Olin Hauser, DO   2 years ago Essential hypertension, benign   [place], DO       Future Appointments             In 3 months [place], Devonne Doughty, [facility] [place]  amLODipine (NORVASC) 5 MG tablet 90 tablet 0    Sig: TAKE 1 TABLET EVERY DAY      Cardiovascular:  Calcium Channel Blockers Failed - 06/06/2020 11:06 AM      Failed - Last BP in normal range    BP Readings from Last 1 Encounters:  02/26/20 (!) 112/46          Passed - Valid encounter within last 6 months    Recent Outpatient Visits           3 months ago Essential hypertension, benign   [facility] [place], Devonne Doughty, DO   9 months ago Annual physical exam   [place], DO   1 year ago Elevated hemoglobin A1c   [place], Devonne Doughty, DO   1 year ago Annual physical exam   [facility] Olin Hauser, DO   2 years ago Essential hypertension, benign   [place], DO       Future Appointments             In 3 months [place], Devonne Doughty, DO [facility]              lisinopril (ZESTRIL) 40 MG tablet 90 tablet 0    Sig: TAKE 1 TABLET EVERY DAY      Cardiovascular:  ACE Inhibitors Failed - 06/06/2020 11:06 AM      Failed - Cr in normal range and within 180 days    Creat  Date Value Ref Range Status  08/22/2019 0.85 0.60 - 0.88 mg/dL  Final    Comment:    For patients >78 years of age, the reference limit for Creatinine is approximately 13% higher for people identified as African-American. .    Creatinine, Ser  Date Value Ref Range Status  09/23/2019 0.76 0.44 - 1.00 mg/dL Final          Failed - K in normal range and within 180 days    Potassium  Date Value Ref Range Status  09/23/2019 3.4 (L) 3.5 - 5.1 mmol/L Final  03/19/2014 3.8 3.5 - 5.1 mmol/L Final          Failed - Last BP in normal range    BP Readings from Last 1 Encounters:  02/26/20 (!) 112/46          Passed - Patient is not pregnant      Passed - Valid encounter within last 6 months    Recent Outpatient Visits           3 months ago Essential hypertension, benign   [facility] Olin Hauser, DO   9 months ago Annual physical exam   [place], DO   1 year ago Elevated hemoglobin A1c   [place], DO   1 year ago Annual physical exam   [facility] Olin Hauser, DO   2 years ago Essential hypertension, benign   [place], DO       Future Appointments             In 3 months [place], Devonne Doughty, DO [facility]              simvastatin (ZOCOR) 40 MG tablet 90 tablet 0    Sig: TAKE  1 TABLET EVERY DAY      Cardiovascular:  Antilipid - Statins Failed - 06/06/2020 11:06 AM      Failed - HDL in normal range and within 360 days    HDL  Date Value Ref Range Status  08/22/2019 49 (L) > OR = 50 mg/dL Final  04/30/2015 47 >39 mg/dL Final    Comment:    According to ATP-III Guidelines, HDL-C >59 mg/dL is considered a negative risk factor for CHD.           Failed - Triglycerides in normal range and within 360 days    Triglycerides  Date Value Ref Range Status  08/22/2019 152 (H) <150 mg/dL Final          Passed -  Total Cholesterol in normal range and within 360 days    Cholesterol, Total  Date Value Ref Range Status  04/30/2015 181 100 - 199 mg/dL Final   Cholesterol  Date Value Ref Range Status  08/22/2019 142 <200 mg/dL Final          Passed - LDL in normal range and within 360 days    LDL Cholesterol (Calc)  Date Value Ref Range Status  08/22/2019 70 mg/dL (calc) Final    Comment:    Reference range: <100 . Desirable range <100 mg/dL for primary prevention;   <70 mg/dL for patients with CHD or diabetic patients  with > or = 2 CHD risk factors. Marland Kitchen LDL-C is now calculated using the Martin-Hopkins  calculation, which is a validated novel method providing  better accuracy than the Friedewald equation in the  estimation of LDL-C.  Cresenciano Genre et al. Annamaria Helling. 1610;960(45): 2061-2068  (http://education.[employer].com/faq/FAQ164)           Passed - Patient is not pregnant      Passed - Valid encounter within last 12 months    Recent Outpatient Visits           3 months ago Essential hypertension, benign   [facility] Olin Hauser, DO   9 months ago Annual physical exam   [place], DO   1 year ago Elevated hemoglobin A1c   [facility] Olin Hauser, DO   1 year ago Annual physical exam   [facility] Olin Hauser, DO   2 years ago Essential hypertension, benign   [facility] [place], Devonne Doughty, DO       Future Appointments             In 3 months [place], Devonne Doughty, DO [facility]

## 2020-06-12 DIAGNOSIS — L980 Pyogenic granuloma: Secondary | ICD-10-CM | POA: Diagnosis not present

## 2020-06-26 DIAGNOSIS — M65331 Trigger finger, right middle finger: Secondary | ICD-10-CM | POA: Diagnosis not present

## 2020-07-23 DIAGNOSIS — M25511 Pain in right shoulder: Secondary | ICD-10-CM | POA: Diagnosis not present

## 2020-08-20 ENCOUNTER — Other Ambulatory Visit: Payer: Medicare PPO

## 2020-08-27 ENCOUNTER — Other Ambulatory Visit: Payer: Self-pay | Admitting: *Deleted

## 2020-08-27 ENCOUNTER — Encounter: Payer: Medicare PPO | Admitting: Family Medicine

## 2020-08-27 DIAGNOSIS — Z0000 Encounter for general adult medical examination without abnormal findings: Secondary | ICD-10-CM

## 2020-08-27 DIAGNOSIS — R7309 Other abnormal glucose: Secondary | ICD-10-CM

## 2020-08-27 DIAGNOSIS — I10 Essential (primary) hypertension: Secondary | ICD-10-CM

## 2020-08-27 DIAGNOSIS — E782 Mixed hyperlipidemia: Secondary | ICD-10-CM

## 2020-08-28 ENCOUNTER — Other Ambulatory Visit: Payer: Medicare PPO

## 2020-08-28 ENCOUNTER — Other Ambulatory Visit: Payer: Self-pay

## 2020-08-28 ENCOUNTER — Encounter: Payer: Medicare PPO | Admitting: Family Medicine

## 2020-08-28 DIAGNOSIS — Z0000 Encounter for general adult medical examination without abnormal findings: Secondary | ICD-10-CM | POA: Diagnosis not present

## 2020-08-28 DIAGNOSIS — R7309 Other abnormal glucose: Secondary | ICD-10-CM | POA: Diagnosis not present

## 2020-08-28 DIAGNOSIS — E782 Mixed hyperlipidemia: Secondary | ICD-10-CM | POA: Diagnosis not present

## 2020-08-28 DIAGNOSIS — I10 Essential (primary) hypertension: Secondary | ICD-10-CM | POA: Diagnosis not present

## 2020-08-29 LAB — CBC WITH DIFFERENTIAL/PLATELET
Absolute Monocytes: 584 cells/uL (ref 200–950)
Basophils Absolute: 40 cells/uL (ref 0–200)
Basophils Relative: 0.5 %
Eosinophils Absolute: 208 cells/uL (ref 15–500)
Eosinophils Relative: 2.6 %
HCT: 43 % (ref 35.0–45.0)
Hemoglobin: 14.5 g/dL (ref 11.7–15.5)
Lymphs Abs: 1856 cells/uL (ref 850–3900)
MCH: 31.3 pg (ref 27.0–33.0)
MCHC: 33.7 g/dL (ref 32.0–36.0)
MCV: 92.9 fL (ref 80.0–100.0)
MPV: 10.4 fL (ref 7.5–12.5)
Monocytes Relative: 7.3 %
Neutro Abs: 5312 cells/uL (ref 1500–7800)
Neutrophils Relative %: 66.4 %
Platelets: 231 10*3/uL (ref 140–400)
RBC: 4.63 10*6/uL (ref 3.80–5.10)
RDW: 12.5 % (ref 11.0–15.0)
Total Lymphocyte: 23.2 %
WBC: 8 10*3/uL (ref 3.8–10.8)

## 2020-08-29 LAB — LIPID PANEL
Cholesterol: 155 mg/dL (ref <200)
HDL: 51 mg/dL (ref >=50)
LDL Cholesterol (Calc): 79 mg/dL (calc)
Non-HDL Cholesterol (Calc): 104 mg/dL (calc) (ref <130)
Total CHOL/HDL Ratio: 3 (calc) (ref <5.0)
Triglycerides: 155 mg/dL — ABNORMAL HIGH (ref <150)

## 2020-08-29 LAB — COMPLETE METABOLIC PANEL WITH GFR
AG Ratio: 1.7 (calc) (ref 1.0–2.5)
ALT: 17 U/L (ref 6–29)
AST: 19 U/L (ref 10–35)
Albumin: 4.3 g/dL (ref 3.6–5.1)
Alkaline phosphatase (APISO): 67 U/L (ref 37–153)
BUN: 19 mg/dL (ref 7–25)
CO2: 28 mmol/L (ref 20–32)
Calcium: 9.7 mg/dL (ref 8.6–10.4)
Chloride: 102 mmol/L (ref 98–110)
Creat: 0.83 mg/dL (ref 0.60–0.88)
GFR, Est African American: 75 mL/min/{1.73_m2} (ref >=60)
GFR, Est Non African American: 65 mL/min/{1.73_m2} (ref >=60)
Globulin: 2.5 g/dL (calc) (ref 1.9–3.7)
Glucose, Bld: 88 mg/dL (ref 65–99)
Potassium: 4.1 mmol/L (ref 3.5–5.3)
Sodium: 140 mmol/L (ref 135–146)
Total Bilirubin: 1 mg/dL (ref 0.2–1.2)
Total Protein: 6.8 g/dL (ref 6.1–8.1)

## 2020-08-29 LAB — HEMOGLOBIN A1C
Hgb A1c MFr Bld: 5.3 % of total Hgb (ref <5.7)
Mean Plasma Glucose: 105 mg/dL
eAG (mmol/L): 5.8 mmol/L

## 2020-08-29 LAB — TSH: TSH: 3.3 mIU/L (ref 0.40–4.50)

## 2020-09-04 ENCOUNTER — Other Ambulatory Visit: Payer: Self-pay

## 2020-09-04 ENCOUNTER — Encounter: Payer: Self-pay | Admitting: Family Medicine

## 2020-09-04 ENCOUNTER — Ambulatory Visit (INDEPENDENT_AMBULATORY_CARE_PROVIDER_SITE_OTHER): Payer: Medicare PPO | Admitting: Family Medicine

## 2020-09-04 DIAGNOSIS — I10 Essential (primary) hypertension: Secondary | ICD-10-CM | POA: Diagnosis not present

## 2020-09-04 DIAGNOSIS — E782 Mixed hyperlipidemia: Secondary | ICD-10-CM | POA: Diagnosis not present

## 2020-09-04 DIAGNOSIS — F5101 Primary insomnia: Secondary | ICD-10-CM

## 2020-09-04 MED ORDER — AMLODIPINE BESYLATE 5 MG PO TABS: 5.0000 mg | ORAL_TABLET | Freq: Every day | ORAL | 3 refills | Status: DC

## 2020-09-04 MED ORDER — TRAZODONE HCL 100 MG PO TABS: 100.0000 mg | ORAL_TABLET | Freq: Every evening | ORAL | 3 refills | Status: DC | PRN

## 2020-09-04 MED ORDER — SIMVASTATIN 40 MG PO TABS: 40.0000 mg | ORAL_TABLET | Freq: Every day | ORAL | 3 refills | Status: DC

## 2020-09-04 MED ORDER — LISINOPRIL 40 MG PO TABS: 40.0000 mg | ORAL_TABLET | Freq: Every day | ORAL | 3 refills | Status: DC

## 2020-09-04 NOTE — Assessment & Plan Note (Signed)
Controlled cholesterol on statin and improved lifestyle Last lipid panel 08/2020  Plan: 1. Continue current meds - Simvastatin 40mg , Flaxseed oil 2. Encourage improved lifestyle - low carb/cholesterol, reduce portion size, continue improving regular exercise

## 2020-09-04 NOTE — Patient Instructions (Addendum)
Thank you for coming to the office today.  May try OTC Melatonin 1mg  up to 5mg  - natural sleep supplement - can take this if you are trying to fall back asleep.  Stop taking Hydrochlorothiazide 12.5mg  (half tab). Trial off this med. You may need to tell the pharmacy to stop sending it.  Try Alpha Lipoic Acid supplement to help nerves.  Please schedule a Follow-up Appointment to: Return in about 6 months (around 03/04/2021) for 6 month follow-up HTN.  If you have any other questions or concerns, please feel free to call the office or send a message through [place]. You may also schedule an earlier appointment if necessary.  Additionally, you may be receiving a survey about your experience at our office within a few days to 1 week by e-mail or mail. We value your feedback.  Nobie Putnam, DO [place]

## 2020-09-04 NOTE — Assessment & Plan Note (Signed)
Improved chronic insomnia on trazodone  instead of PRN doing well on nightly - No history of Depression or Anxiety endorsed  Plan: 1. Continue current dose Trazodone 100mg  nightly  Add Melatonin OTC 1-5mg  depending on preferred dose at night can use only PRN if wakes up and needs to help fall back asleep

## 2020-09-04 NOTE — Progress Notes (Signed)
Subjective:    Patient ID: Carla Sparks, female    DOB: [birth year], 87 y.o.   MRN: [id number]  Carla Sparks is a 87 y.o. female presenting on 09/04/2020 for Annual Exam and discuss medication (hydroCHLOTOthiazide and traZODone)   HPI   Here for Annual Physical and Lab Review.  CHRONIC HTN: Reportsno new concerns. Home BP log is normal. However admits some lower readings lately, 105/70 Current Meds -Amlodipine 5mg  daily, Lisinopril 40mg  daily - Still taking HCTZ 12.5mg  dose - asking to stop. Reports good compliance, took meds today. Tolerating well, w/o complaints. Denies CP, dyspnea, HA, edema, dizziness / lightheadedness  Elevated A1c Previous A1c 5.3 to 5.9, now most recent 08/28/20 - A1c 5.3 CBGs:Not checking Meds:None Currently on ACEi, Statin simvastatin Lifestyle: - Diet (balanced diet, reduced appetite in morning) - Exercise (regular exercise) Denies hypoglycemia  Insomnia, Chronic Continues on Trazodone 100mg  nightly, doing well, helps her initiate sleep and helps maintain and fall back asleep. No new concerns. Not taking it PRN. Not on melatonin  HYPERLIPIDEMIA: - Reports no concerns. Last lipid panel 08/2020, with controlled LDL 79. Normal HDL 51. Elevated TG 155 - Currently taking Simvastatin 40mg , Flax Seed Oil, tolerating well without side effects or myalgias  Osteoarthritis, multiple joints / knees bilateral Followed by Wynelle Bourgeois Poggi and Dr Candelaria Stagers, has had prior recurrent steroid cortisone knee injections, now limited relief, she wants to avoid surgery, she has chronic bilateral knee pain stiffness and swelling moderate with arthritis, affects her if increased activity at times or if rest can get more stiff.  Now s/p gel viscus injections in knees, mixed results Recent update S/p R shoulder reverse arthroplasty, Dr Stann Mainland, 09/27/19 No longer methocarbamol Doing PT regularly Now taking Tumeric 1500mg  daily for knees and it  improves.  Additional updates  Right hand with granuloma on finger drained and treated. Also had R 2nd trigger finger with injection and improvement.  Health Maintenance: Declines Flu vaccine this season UTD [place], 11/24/19 and 12/08/19, 06/2020 - history of Omicron variant.   Depression screen [facility] 2/9 02/26/2020 08/29/2019 08/23/2018  Decreased Interest 0 0 0  Down, Depressed, Hopeless 0 0 0  PHQ - 2 Score 0 0 0    Past Medical History:  Diagnosis Date  . Arthritis   . Bleeding from the urethra   . Essential hypertension, benign   . HTN (hypertension)   . Insomnia   . Lesion of urethra   . Mixed hyperlipidemia   . Osteoarthritis   . Paget's disease of vulva ([place])   . PONV (postoperative nausea and vomiting)    Past Surgical History:  Procedure Laterality Date  . ABDOMINAL HYSTERECTOMY    . CHOLECYSTECTOMY    . External hemorrhoid surgery    . GALLBLADDER SURGERY    . REVERSE SHOULDER ARTHROPLASTY Right 09/27/2019   Procedure: REVERSE SHOULDER ARTHROPLASTY;  Surgeon: Nicholes Stairs, MD;  Location: [facility];  Service: Orthopedics;  Laterality: Right;  2 hrs RNFA  . TONSILLECTOMY    . TOTAL ABDOMINAL HYSTERECTOMY W/ BILATERAL SALPINGOOPHORECTOMY    . VULVECTOMY     several surgeries   Social History   Socioeconomic History  . Marital status: Married    Spouse name: Not on file  . Number of children: Not on file  . Years of education: 65  . Highest education level: 12th grade  Occupational History  . Not on file  Tobacco Use  . Smoking status: Never Smoker  . Smokeless tobacco: Never  Used  Vaping Use  . Vaping Use: Never used  Substance and Sexual Activity  . Alcohol use: No  . Drug use: No  . Sexual activity: Not on file    Comment: Hysterectomy  Other Topics Concern  . Not on file  Social History Narrative  . Not on file   Social Determinants of Health   Financial Resource Strain: Not on file  Food Insecurity: Not on file  Transportation  Needs: Not on file  Physical Activity: Not on file  Stress: Not on file  Social Connections: Not on file  Intimate Partner Violence: Not on file   Family History  Problem Relation Age of Onset  . Heart disease Father        Diagnosed age 9  . Kidney cancer Father   . Heart failure Mother   . Cancer Brother   . Alzheimer's disease Maternal Aunt   . Alzheimer's disease Maternal Uncle   . Breast cancer Neg Hx    Current Outpatient Medications on File Prior to Visit  Medication Sig  . Cholecalciferol (VITAMIN D3) 125 MCG (5000 UT) CAPS Take 5,000 Units by mouth daily.   . Flaxseed, Linseed, (FLAX SEED OIL) 1000 MG CAPS Take 1,000 mg by mouth daily.   Marland Kitchen ibuprofen (ADVIL) 600 MG tablet Take 1 tablet (600 mg total) by mouth every 6 (six) hours as needed for up to 30 doses for mild pain or moderate pain.  . Multiple Vitamin (MULTIVITAMIN WITH MINERALS) TABS tablet Take 1 tablet by mouth daily.  . Turmeric Curcumin 500 MG CAPS Take 1,500 mg by mouth daily.  . diclofenac Sodium (VOLTAREN) 1 % GEL APPLY TWO GRAMS TOPICALLY THREE TIMES DAILY AS NEEDED FOR KNEE AND HAND ARTHRITIS PAIN (Patient not taking: Reported on 09/04/2020)   No current facility-administered medications on file prior to visit.    Review of Systems  Constitutional: Negative for activity change, appetite change, chills, diaphoresis, fatigue and fever.  HENT: Negative for congestion and hearing loss.   Eyes: Negative for visual disturbance.  Respiratory: Negative for cough, chest tightness, shortness of breath and wheezing.   Cardiovascular: Negative for chest pain, palpitations and leg swelling.  Gastrointestinal: Negative for abdominal pain, constipation, diarrhea, nausea and vomiting.  Endocrine: Negative for cold intolerance.  Genitourinary: Negative for dysuria, frequency and hematuria.  Musculoskeletal: Negative for arthralgias and neck pain.  Skin: Negative for rash.  Allergic/Immunologic: Negative for  environmental allergies.  Neurological: Negative for dizziness, weakness, light-headedness, numbness and headaches.  Hematological: Negative for adenopathy.  Psychiatric/Behavioral: Negative for behavioral problems, dysphoric mood and sleep disturbance.   Per HPI unless specifically indicated above     Objective:    BP 105/70 (BP Location: Left Arm, Cuff Size: Normal)   Pulse 70   Temp 97.8 F (36.6 C) (Temporal)   Ht 5\' 5"  (1.651 m)   Wt 150 lb 6.4 oz (68.2 kg)   LMP  (LMP Unknown)   SpO2 100%   BMI 25.03 kg/m   Wt Readings from Last 3 Encounters:  09/04/20 150 lb 6.4 oz (68.2 kg)  02/26/20 150 lb (68 kg)  09/27/19 155 lb (70.3 kg)    Physical Exam Vitals and nursing note reviewed.  Constitutional:      General: She is not in acute distress.    Appearance: She is well-developed and well-nourished. She is not diaphoretic.     Comments: Well-appearing, comfortable, cooperative  HENT:     Head: Normocephalic and atraumatic.     Mouth/Throat:  Mouth: Oropharynx is clear and moist.  Eyes:     General:        Right eye: No discharge.        Left eye: No discharge.     Extraocular Movements: EOM normal.     Conjunctiva/sclera: Conjunctivae normal.     Pupils: Pupils are equal, round, and reactive to light.  Neck:     Thyroid: No thyromegaly.  Cardiovascular:     Rate and Rhythm: Normal rate and regular rhythm.     Pulses: Intact distal pulses.     Heart sounds: Normal heart sounds. No murmur heard.   Pulmonary:     Effort: Pulmonary effort is normal. No respiratory distress.     Breath sounds: Normal breath sounds. No wheezing or rales.  Abdominal:     General: Bowel sounds are normal. There is no distension.     Palpations: Abdomen is soft. There is no mass.     Tenderness: There is no abdominal tenderness.  Musculoskeletal:        General: No tenderness or edema. Normal range of motion.     Cervical back: Normal range of motion and neck supple.     Comments:  Upper / Lower Extremities: - Normal muscle tone, strength bilateral upper extremities 5/5, lower extremities 5/5  Lymphadenopathy:     Cervical: No cervical adenopathy.  Skin:    General: Skin is warm and dry.     Findings: No erythema or rash.  Neurological:     Mental Status: She is alert and oriented to person, place, and time.     Comments: Distal sensation intact to light touch all extremities  Psychiatric:        Mood and Affect: Mood and affect normal.        Behavior: Behavior normal.     Comments: Well groomed, good eye contact, normal speech and thoughts       Results for orders placed or performed in visit on 08/27/20  Lipid panel  Result Value Ref Range   Cholesterol 155 <200 mg/dL   HDL 51 > OR = 50 mg/dL   Triglycerides 155 (H) <150 mg/dL   LDL Cholesterol (Calc) 79 mg/dL (calc)   Total CHOL/HDL Ratio 3.0 <5.0 (calc)   Non-HDL Cholesterol (Calc) 104 <130 mg/dL (calc)  COMPLETE METABOLIC PANEL WITH GFR  Result Value Ref Range   Glucose, Bld 88 65 - 99 mg/dL   BUN 19 7 - 25 mg/dL   Creat 0.83 0.60 - 0.88 mg/dL   GFR, Est Non African American 65 > OR = 60 mL/min/1.65m2   GFR, Est African American 75 > OR = 60 mL/min/1.32m2   BUN/Creatinine Ratio NOT APPLICABLE 6 - 22 (calc)   Sodium 140 135 - 146 mmol/L   Potassium 4.1 3.5 - 5.3 mmol/L   Chloride 102 98 - 110 mmol/L   CO2 28 20 - 32 mmol/L   Calcium 9.7 8.6 - 10.4 mg/dL   Total Protein 6.8 6.1 - 8.1 g/dL   Albumin 4.3 3.6 - 5.1 g/dL   Globulin 2.5 1.9 - 3.7 g/dL (calc)   AG Ratio 1.7 1.0 - 2.5 (calc)   Total Bilirubin 1.0 0.2 - 1.2 mg/dL   Alkaline phosphatase (APISO) 67 37 - 153 U/L   AST 19 10 - 35 U/L   ALT 17 6 - 29 U/L  CBC with Differential/Platelet  Result Value Ref Range   WBC 8.0 3.8 - 10.8 Thousand/uL   RBC 4.63 3.80 -  5.10 Million/uL   Hemoglobin 14.5 11.7 - 15.5 g/dL   HCT 43.0 35.0 - 45.0 %   MCV 92.9 80.0 - 100.0 fL   MCH 31.3 27.0 - 33.0 pg   MCHC 33.7 32.0 - 36.0 g/dL   RDW 12.5 11.0  - 15.0 %   Platelets 231 140 - 400 Thousand/uL   MPV 10.4 7.5 - 12.5 fL   Neutro Abs 5,312 1,500 - 7,800 cells/uL   Lymphs Abs 1,856 850 - 3,900 cells/uL   Absolute Monocytes 584 200 - 950 cells/uL   Eosinophils Absolute 208 15 - 500 cells/uL   Basophils Absolute 40 0 - 200 cells/uL   Neutrophils Relative % 66.4 %   Total Lymphocyte 23.2 %   Monocytes Relative 7.3 %   Eosinophils Relative 2.6 %   Basophils Relative 0.5 %  Hemoglobin A1c  Result Value Ref Range   Hgb A1c MFr Bld 5.3 <5.7 % of total Hgb   Mean Plasma Glucose 105 mg/dL   eAG (mmol/L) 5.8 mmol/L  TSH  Result Value Ref Range   TSH 3.30 0.40 - 4.50 mIU/L      Assessment & Plan:   Problem List Items Addressed This Visit    Mixed hyperlipidemia    Controlled cholesterol on statin and improved lifestyle Last lipid panel 08/2020  Plan: 1. Continue current meds - Simvastatin 40mg , Flaxseed oil 2. Encourage improved lifestyle - low carb/cholesterol, reduce portion size, continue improving regular exercise      Relevant Medications   amLODipine (NORVASC) 5 MG tablet   lisinopril (ZESTRIL) 40 MG tablet   simvastatin (ZOCOR) 40 MG tablet   Insomnia    Improved chronic insomnia on trazodone  instead of PRN doing well on nightly - No history of Depression or Anxiety endorsed  Plan: 1. Continue current dose Trazodone 100mg  nightly  Add Melatonin OTC 1-5mg  depending on preferred dose at night can use only PRN if wakes up and needs to help fall back asleep      Relevant Medications   traZODone (DESYREL) 100 MG tablet   Essential hypertension, benign    Well controlled HTN, lower normal readings, some weight loss Home readings reviewed Without known complication. No known CAD/MI Off BB Metoprolol  Plan:  1. HOLD HCTZ 12.5mg  daily - discontinue for now. - Continue current BP regimen Amlodipine 5mg , Lisinopril 40mg  refilled 2. Encourage improved lifestyle - low sodium diet, regular exercise 3. Continue monitor  BP outside office, bring readings to next visit, if persistently >140/90 or new symptoms notify office sooner 4. Follow-up 6 months      Relevant Medications   amLODipine (NORVASC) 5 MG tablet   lisinopril (ZESTRIL) 40 MG tablet   simvastatin (ZOCOR) 40 MG tablet      Updated Health Maintenance information Reviewed recent lab results with patient Encouraged improvement to lifestyle with diet and exercise Goal maintain weight  May try Alpha Lipoic Acid for some lower extremity neuropathy or tingling symptoms PRN.  Meds ordered this encounter  Medications  . amLODipine (NORVASC) 5 MG tablet    Sig: Take 1 tablet (5 mg total) by mouth daily.    Dispense:  90 tablet    Refill:  3  . lisinopril (ZESTRIL) 40 MG tablet    Sig: Take 1 tablet (40 mg total) by mouth daily.    Dispense:  90 tablet    Refill:  3  . simvastatin (ZOCOR) 40 MG tablet    Sig: Take 1 tablet (40 mg total) by  mouth daily at 6 PM.    Dispense:  90 tablet    Refill:  3  . traZODone (DESYREL) 100 MG tablet    Sig: Take 1 tablet (100 mg total) by mouth at bedtime as needed for sleep.    Dispense:  90 tablet    Refill:  3     Follow up plan: Return in about 6 months (around 03/04/2021) for 6 month follow-up HTN.  Nobie Putnam, [facility] 09/04/2020, 11:04 AM

## 2020-09-04 NOTE — Assessment & Plan Note (Signed)
Well controlled HTN, lower normal readings, some weight loss Home readings reviewed Without known complication. No known CAD/MI Off BB Metoprolol  Plan:  1. HOLD HCTZ 12.5mg  daily - discontinue for now. - Continue current BP regimen Amlodipine 5mg , Lisinopril 40mg  refilled 2. Encourage improved lifestyle - low sodium diet, regular exercise 3. Continue monitor BP outside office, bring readings to next visit, if persistently >140/90 or new symptoms notify office sooner 4. Follow-up 6 months

## 2020-09-30 DIAGNOSIS — G8929 Other chronic pain: Secondary | ICD-10-CM | POA: Diagnosis not present

## 2020-09-30 DIAGNOSIS — M170 Bilateral primary osteoarthritis of knee: Secondary | ICD-10-CM | POA: Diagnosis not present

## 2020-09-30 DIAGNOSIS — M25562 Pain in left knee: Secondary | ICD-10-CM | POA: Diagnosis not present

## 2020-09-30 DIAGNOSIS — M25462 Effusion, left knee: Secondary | ICD-10-CM | POA: Diagnosis not present

## 2020-09-30 DIAGNOSIS — M25461 Effusion, right knee: Secondary | ICD-10-CM | POA: Diagnosis not present

## 2020-09-30 DIAGNOSIS — M25561 Pain in right knee: Secondary | ICD-10-CM | POA: Diagnosis not present

## 2021-01-08 IMAGING — CT CT CERVICAL SPINE W/O CM
3 of 4 series · 13 of 35 positions shown, 16 images · non-contrast
Comparison: None.

CLINICAL DATA: 85-year-old female with posttraumatic headache.

EXAM:
CT HEAD WITHOUT CONTRAST
CT MAXILLOFACIAL WITHOUT CONTRAST
CT CERVICAL SPINE WITHOUT CONTRAST
TECHNIQUE: Multidetector CT imaging of the head, cervical spine, and
maxillofacial structures were performed using the standard protocol
without intravenous contrast. Multiplanar CT image reconstructions
of the cervical spine and maxillofacial structures were also
generated.

[Series 5: orthogonal axials · axial · 0.37mm/px · z∈[-329,-228]mm · 5 of 90 slices shown, 7 images]
[im 15/90  soft-tissue]
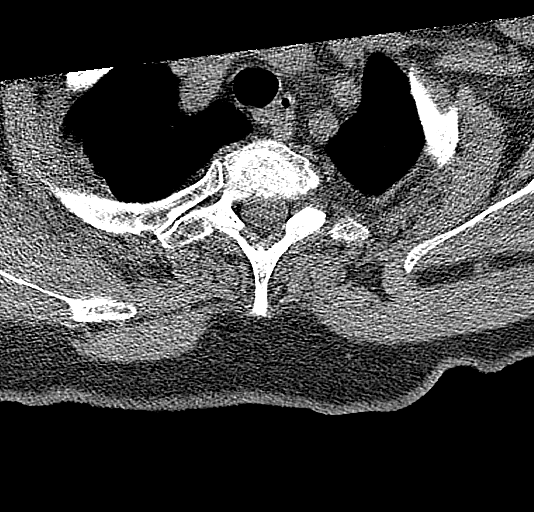
[im 15/90  bone]
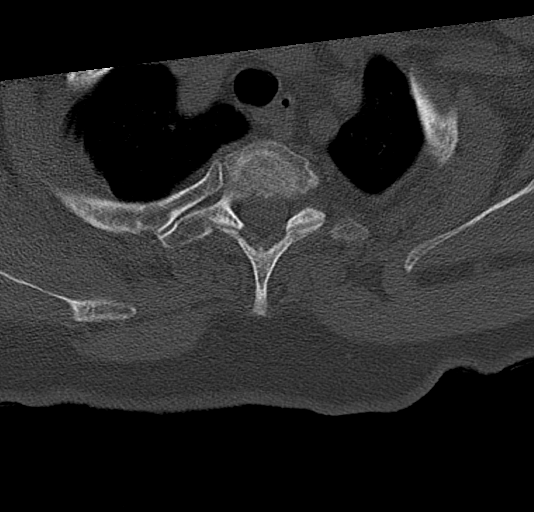
[im 30/90  bone]
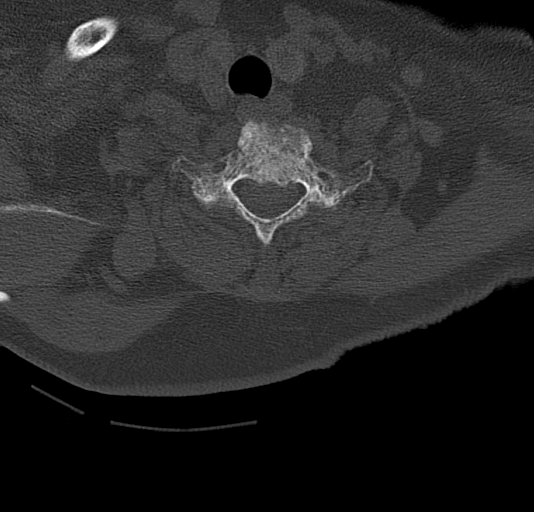
[im 45/90  bone]
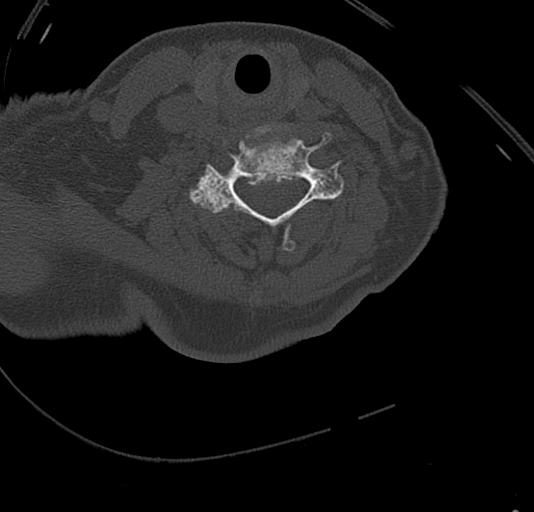
[im 60/90  bone]
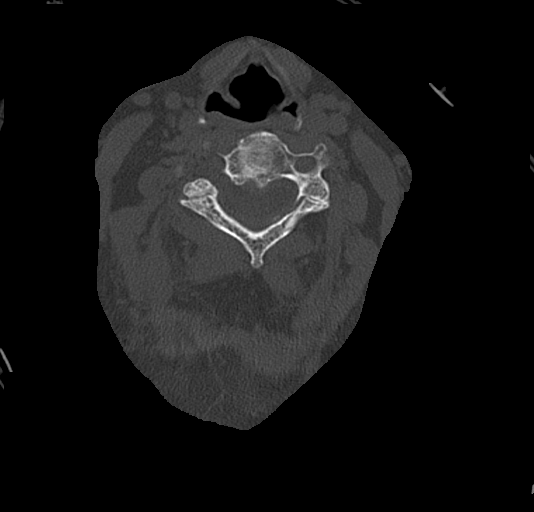
[im 75/90  soft-tissue]
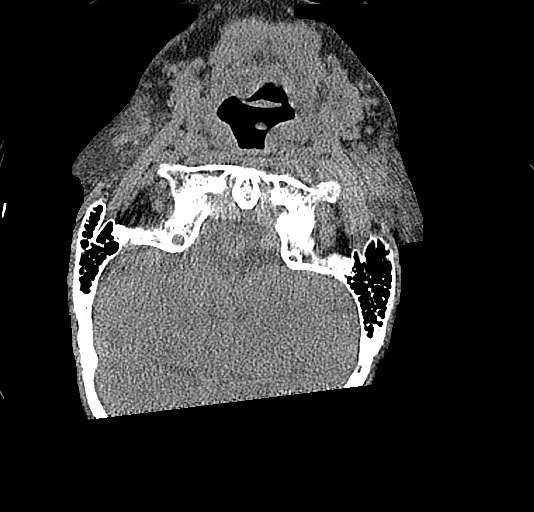
[im 75/90  bone]
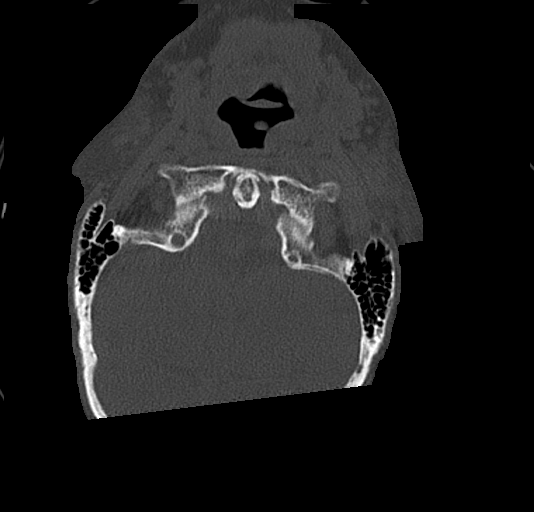

[Series 6: coronal bone · coronal · 0.21mm/px · 3 of 44 slices shown]
[im 9/44  bone]
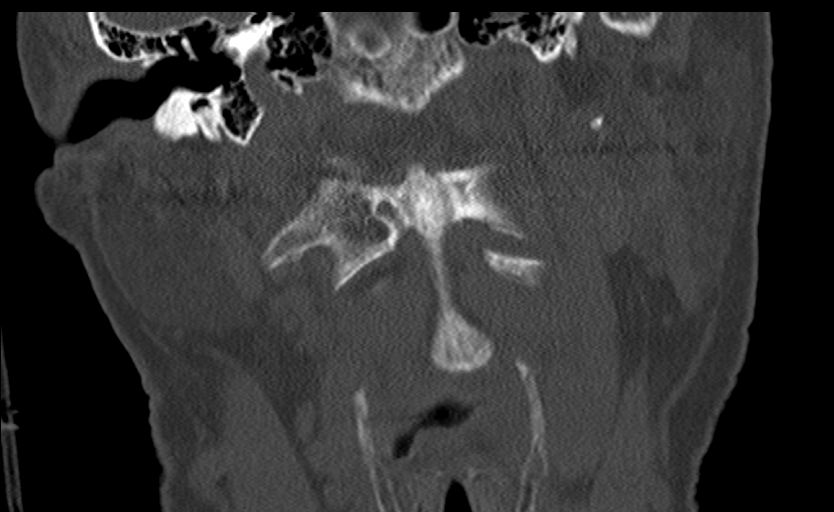
[im 18/44  bone]
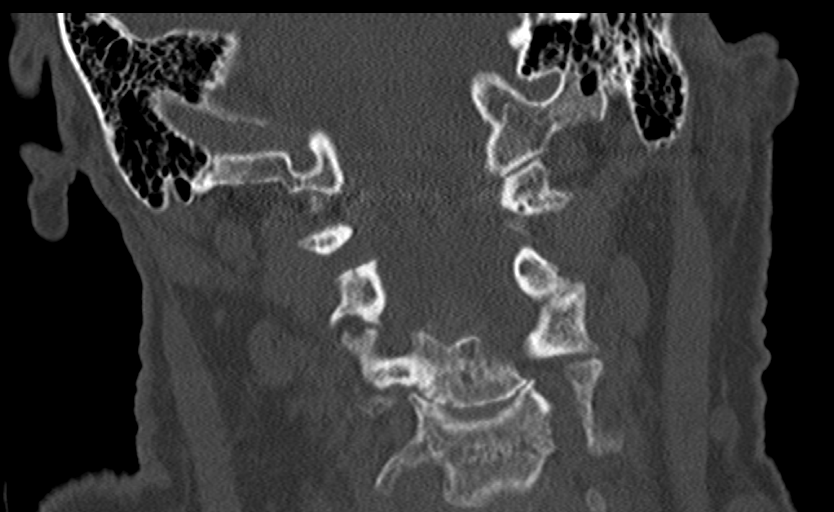
[im 26/44  bone]
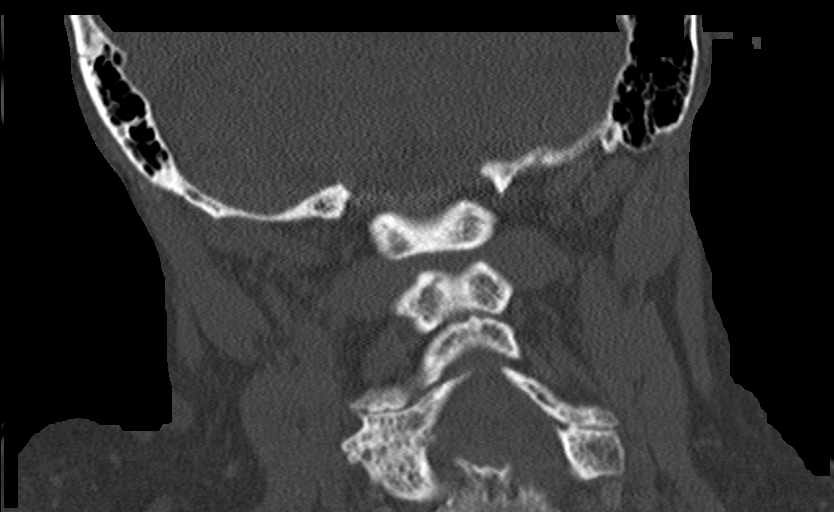

[Series 8: sagittal bone · sagittal · 0.38mm/px · 5 of 57 slices shown, 6 images]
[im 19/57  bone]
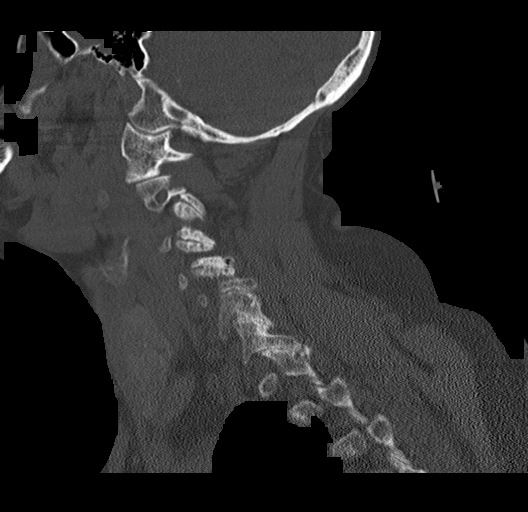
[im 24/57  bone]
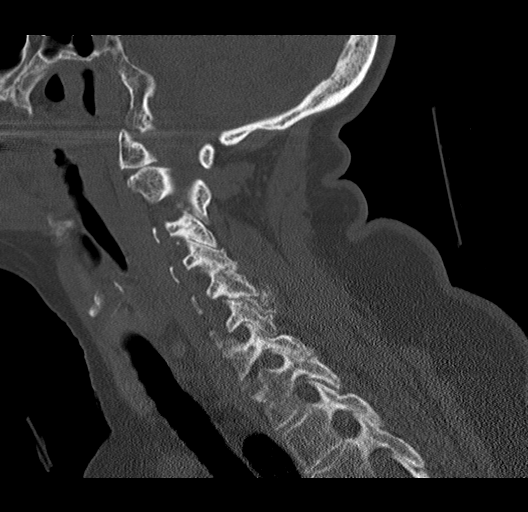
[im 29/57  soft-tissue]
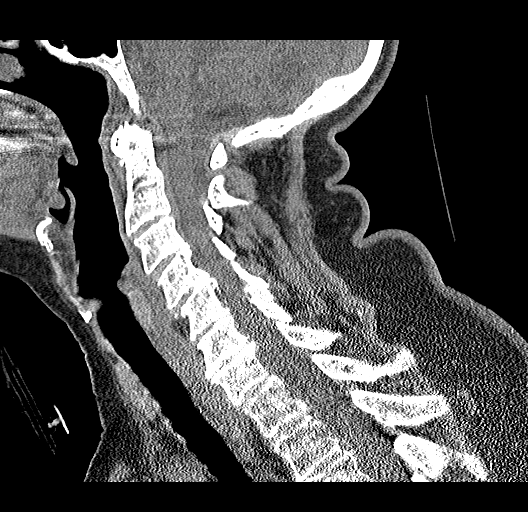
[im 29/57  bone]
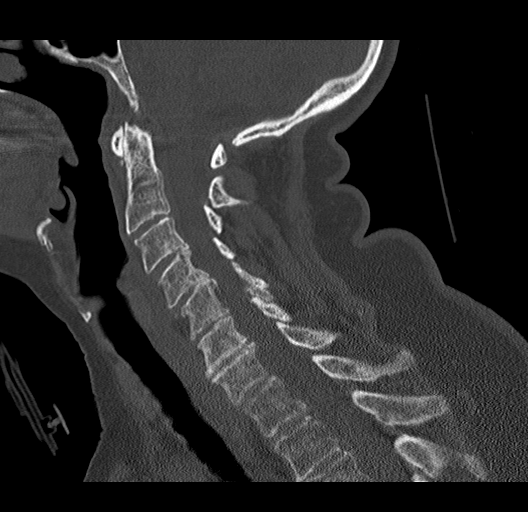
[im 33/57  bone]
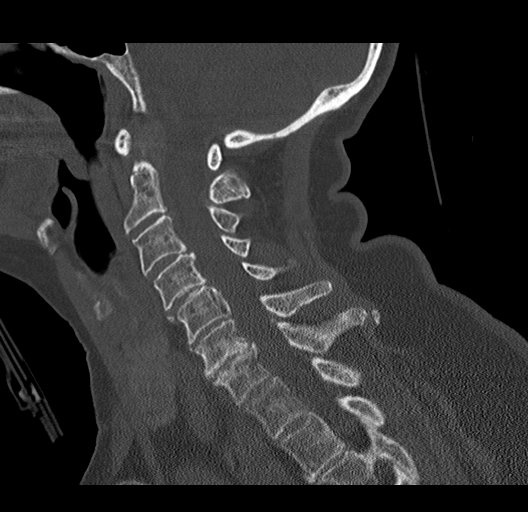
[im 38/57  bone]
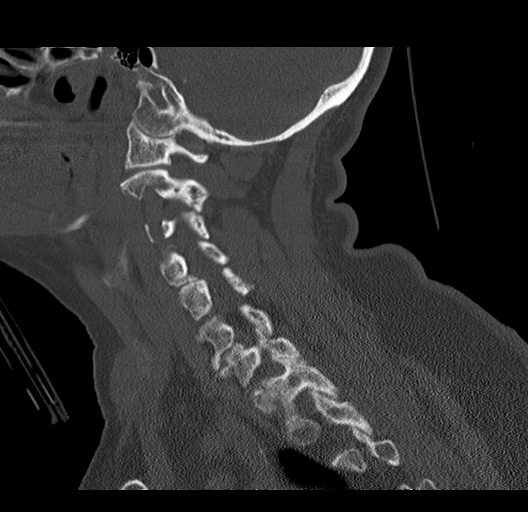

[13 of 35 positions shown; findings below may reference images not displayed]

FINDINGS: CT HEAD FINDINGS

Brain: There is mild age-related atrophy and moderate chronic
microvascular ischemic changes. There is no acute intracranial
hemorrhage. There is a 2.6 x 2.0 cm partially calcified dural-based
mass along the right parietal calvarium most consistent with a
meningioma. There is associated mild mass effect on the adjacent
brain cortex. No midline shift. No extra-axial fluid collection.

Vascular: No hyperdense vessel or unexpected calcification.

Skull: Normal. Negative for fracture or focal lesion.

Other: Mild left periorbital contusion.

CT MAXILLOFACIAL FINDINGS

Osseous: No fracture or mandibular dislocation. No destructive
process.

Orbits: Negative. No traumatic or inflammatory finding.

Sinuses: Clear.

Soft tissues: Mild left periorbital contusion. Focal soft tissue
nodularity along the left side of the nose. Clinical correlation is
recommended. No drainable fluid collection or hematoma.

CT CERVICAL SPINE FINDINGS

Alignment: No acute subluxation. There is straightening of normal
cervical lordosis which may be positional or due to muscle spasm.

Skull base and vertebrae: No acute fracture. Osteopenia.

Soft tissues and spinal canal: No prevertebral fluid or swelling. No
visible canal hematoma.

Disc levels: Multilevel degenerative changes with endplate
irregularity and disc space narrowing and spurring.

Upper chest: Negative.

Other: Ill-defined bilateral thyroid nodules. Further evaluation
with ultrasound on a nonemergent basis recommended.
IMPRESSION: 1. No acute intracranial pathology.
2. Right parietal meningioma.
3. No acute/traumatic cervical spine pathology.
4. No acute facial bone fractures.

## 2021-01-08 IMAGING — CT CT HEAD W/O CM
3 series · 14 of 47 positions shown, 16 images · non-contrast
Comparison: None.

CLINICAL DATA: 85-year-old female with posttraumatic headache.

EXAM:
CT HEAD WITHOUT CONTRAST
CT MAXILLOFACIAL WITHOUT CONTRAST
CT CERVICAL SPINE WITHOUT CONTRAST
TECHNIQUE: Multidetector CT imaging of the head, cervical spine, and
maxillofacial structures were performed using the standard protocol
without intravenous contrast. Multiplanar CT image reconstructions
of the cervical spine and maxillofacial structures were also
generated.

[Series 3: head wo · axial · 0.50mm/px · z∈[-182,-47]mm · 8 of 33 slices shown, 10 images]
[im 3/33  brain]
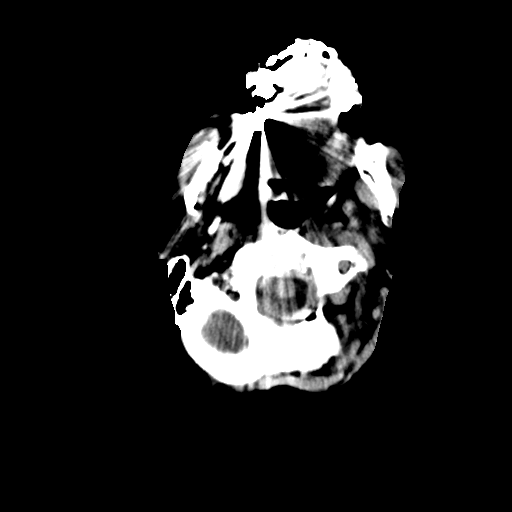
[im 3/33  bone]
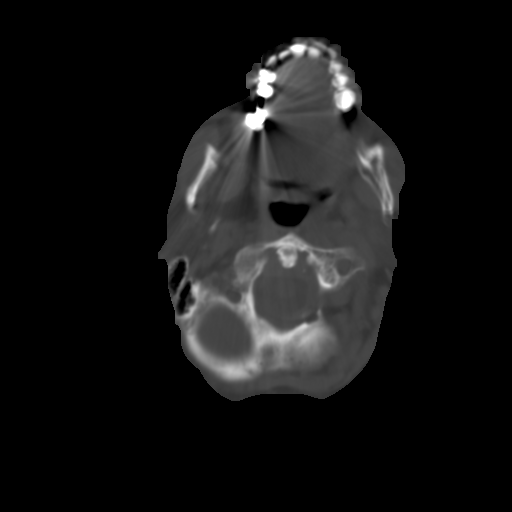
[im 7/33  brain]
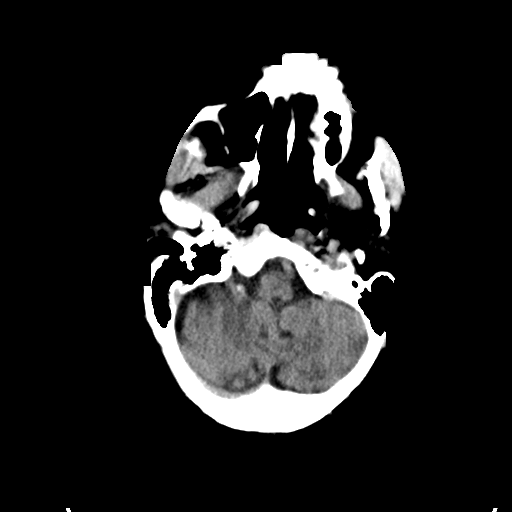
[im 10/33  brain]
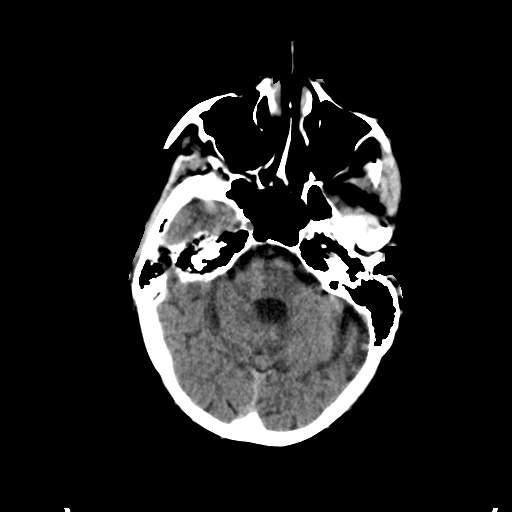
[im 15/33  brain]
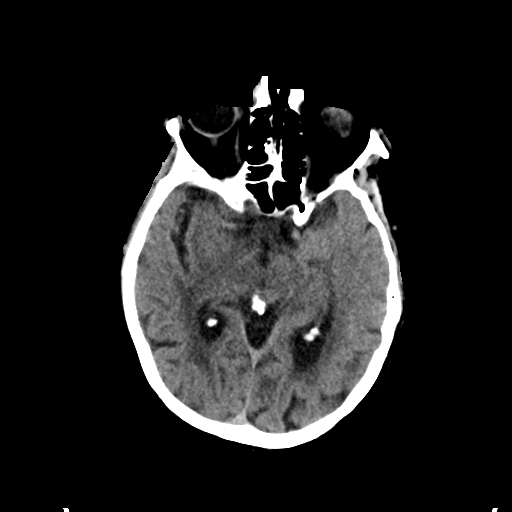
[im 18/33  brain]
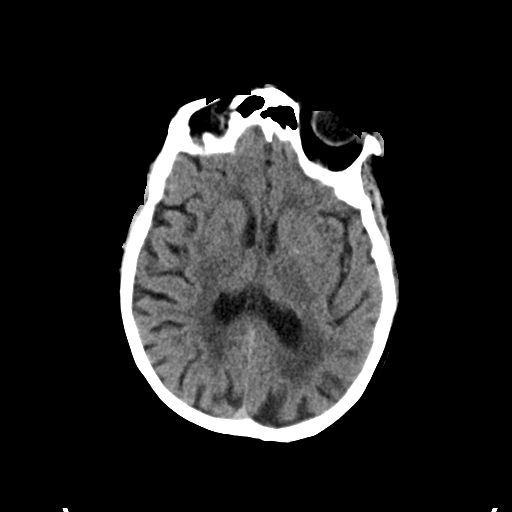
[im 18/33  bone]
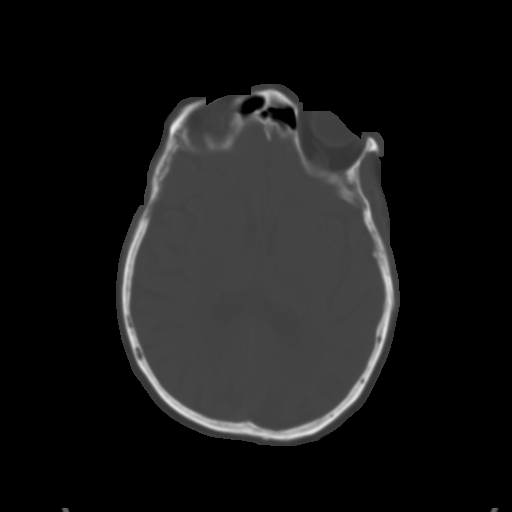
[im 23/33  brain]
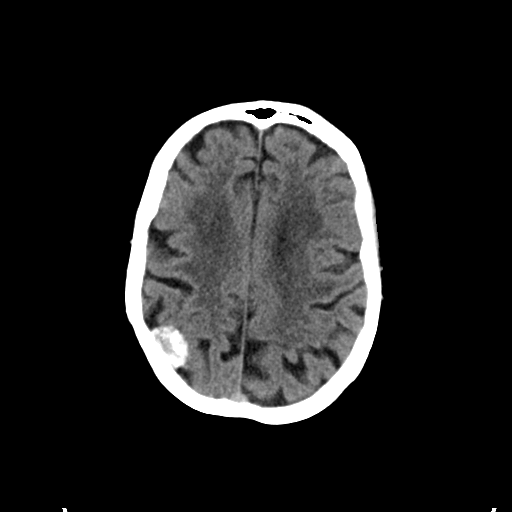
[im 26/33  brain]
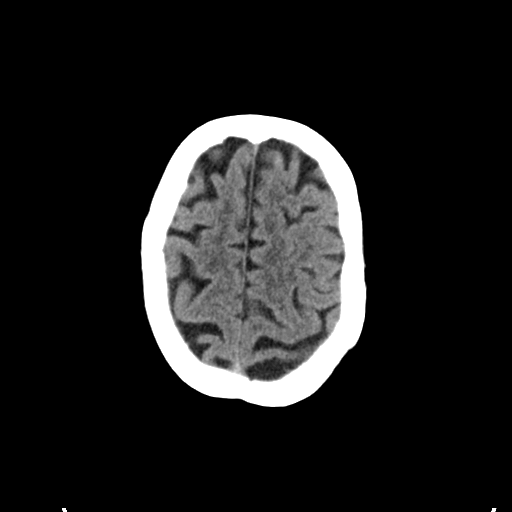
[im 30/33  brain]
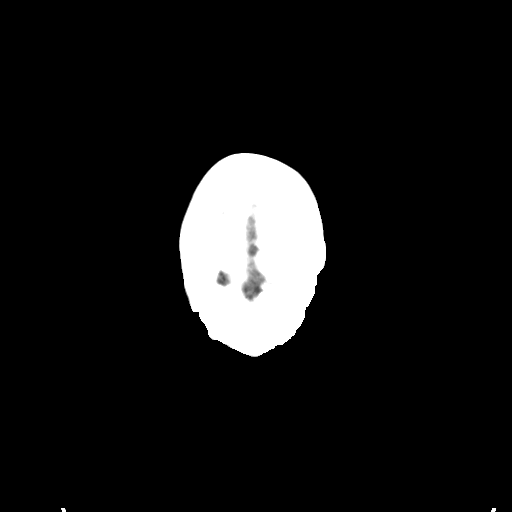

[Series 5: coronal soft tissue · coronal · 0.33mm/px · 3 of 65 slices shown]
[im 22/65  brain]
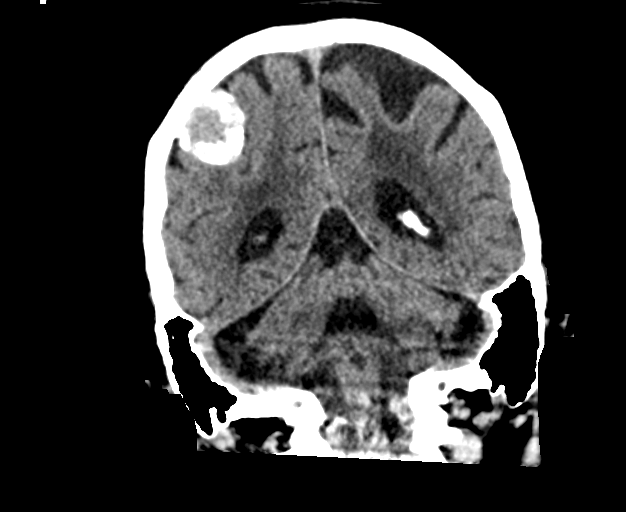
[im 29/65  brain]
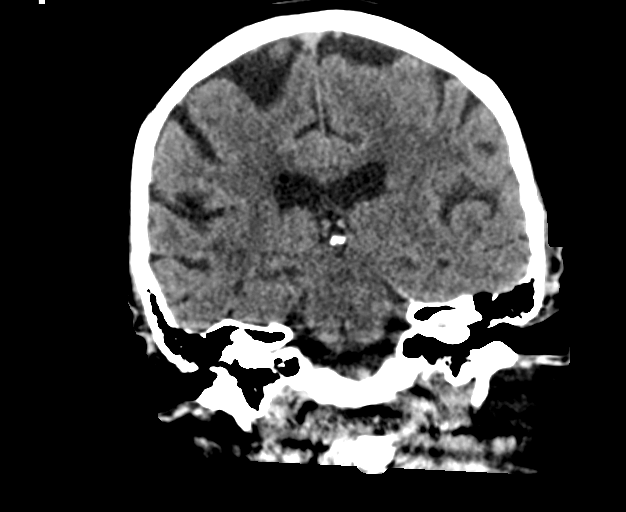
[im 36/65  brain]
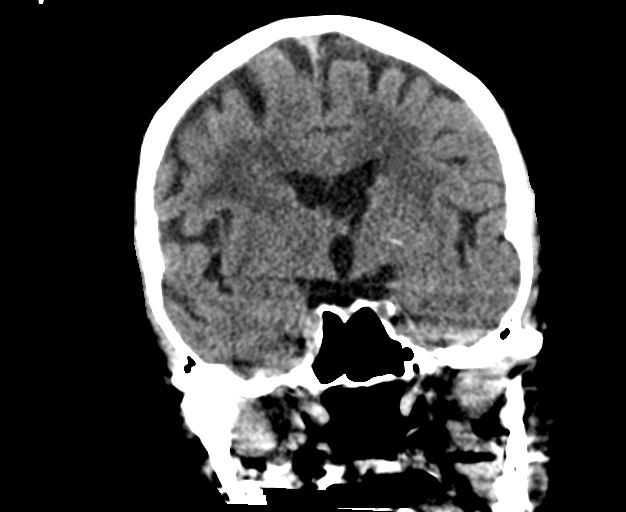

[Series 6: sagittal soft tissue · sagittal · 0.34mm/px · 3 of 49 slices shown]
[im 17/49  brain]
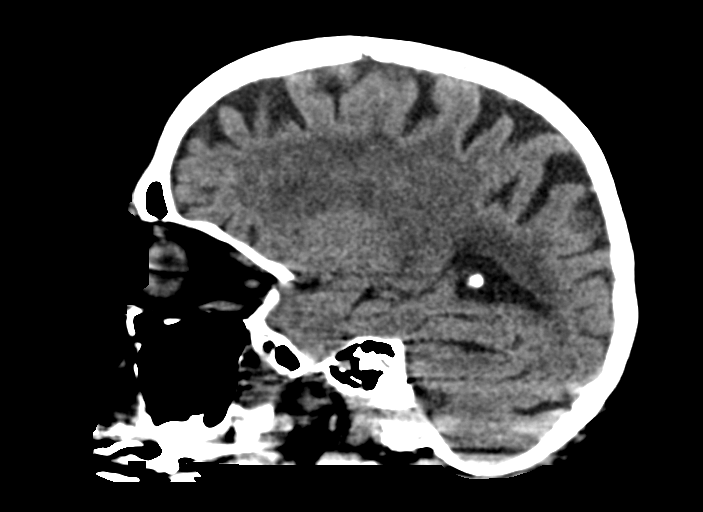
[im 25/49  brain]
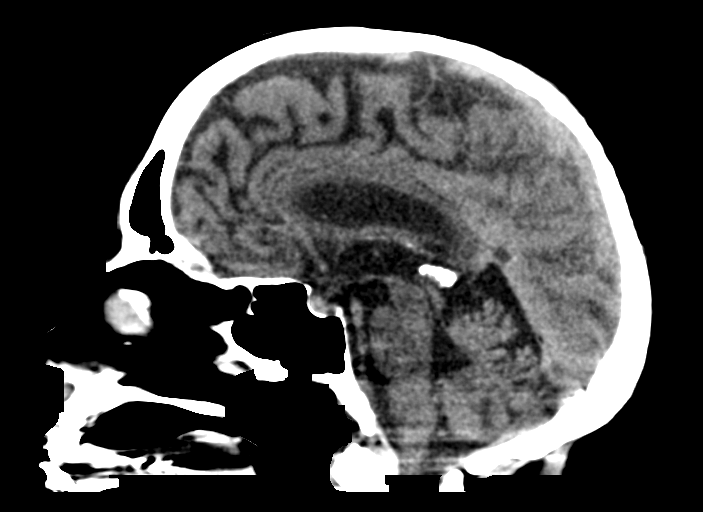
[im 33/49  brain]
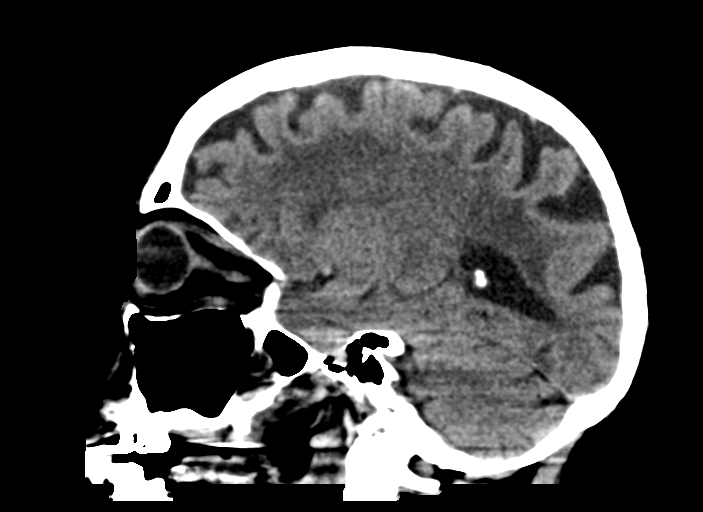

[14 of 47 positions shown; findings below may reference images not displayed]

FINDINGS: CT HEAD FINDINGS

Brain: There is mild age-related atrophy and moderate chronic
microvascular ischemic changes. There is no acute intracranial
hemorrhage. There is a 2.6 x 2.0 cm partially calcified dural-based
mass along the right parietal calvarium most consistent with a
meningioma. There is associated mild mass effect on the adjacent
brain cortex. No midline shift. No extra-axial fluid collection.

Vascular: No hyperdense vessel or unexpected calcification.

Skull: Normal. Negative for fracture or focal lesion.

Other: Mild left periorbital contusion.

CT MAXILLOFACIAL FINDINGS

Osseous: No fracture or mandibular dislocation. No destructive
process.

Orbits: Negative. No traumatic or inflammatory finding.

Sinuses: Clear.

Soft tissues: Mild left periorbital contusion. Focal soft tissue
nodularity along the left side of the nose. Clinical correlation is
recommended. No drainable fluid collection or hematoma.

CT CERVICAL SPINE FINDINGS

Alignment: No acute subluxation. There is straightening of normal
cervical lordosis which may be positional or due to muscle spasm.

Skull base and vertebrae: No acute fracture. Osteopenia.

Soft tissues and spinal canal: No prevertebral fluid or swelling. No
visible canal hematoma.

Disc levels: Multilevel degenerative changes with endplate
irregularity and disc space narrowing and spurring.

Upper chest: Negative.

Other: Ill-defined bilateral thyroid nodules. Further evaluation
with ultrasound on a nonemergent basis recommended.
IMPRESSION: 1. No acute intracranial pathology.
2. Right parietal meningioma.
3. No acute/traumatic cervical spine pathology.
4. No acute facial bone fractures.

## 2021-02-03 ENCOUNTER — Encounter: Payer: Self-pay | Admitting: Emergency Medicine

## 2021-02-03 ENCOUNTER — Emergency Department
Admission: EM | Admit: 2021-02-03 | Discharge: 2021-02-03 | Disposition: A | Discharge status: Home / Self Care | Payer: Medicare PPO | Attending: Emergency Medicine | Admitting: Emergency Medicine

## 2021-02-03 ENCOUNTER — Other Ambulatory Visit: Payer: Self-pay

## 2021-02-03 ENCOUNTER — Emergency Department: Payer: Medicare PPO

## 2021-02-03 DIAGNOSIS — N939 Abnormal uterine and vaginal bleeding, unspecified: Secondary | ICD-10-CM

## 2021-02-03 DIAGNOSIS — Z79899 Other long term (current) drug therapy: Secondary | ICD-10-CM | POA: Diagnosis not present

## 2021-02-03 DIAGNOSIS — I10 Essential (primary) hypertension: Secondary | ICD-10-CM | POA: Insufficient documentation

## 2021-02-03 DIAGNOSIS — C519 Malignant neoplasm of vulva, unspecified: Secondary | ICD-10-CM | POA: Diagnosis not present

## 2021-02-03 LAB — COMPREHENSIVE METABOLIC PANEL
ALT: 14 U/L (ref 0–44)
AST: 23 U/L (ref 15–41)
Albumin: 4.3 g/dL (ref 3.5–5.0)
Alkaline Phosphatase: 64 U/L (ref 38–126)
Anion gap: 6 (ref 5–15)
BUN: 13 mg/dL (ref 8–23)
CO2: 28 mmol/L (ref 22–32)
Calcium: 9.3 mg/dL (ref 8.9–10.3)
Chloride: 106 mmol/L (ref 98–111)
Creatinine, Ser: 0.7 mg/dL (ref 0.44–1.00)
GFR, Estimated: >60 mL/min (ref >60)
Glucose, Bld: 105 mg/dL — ABNORMAL HIGH (ref 70–99)
Potassium: 4 mmol/L (ref 3.5–5.1)
Sodium: 140 mmol/L (ref 135–145)
Total Bilirubin: 1.2 mg/dL (ref 0.3–1.2)
Total Protein: 7.3 g/dL (ref 6.5–8.1)

## 2021-02-03 LAB — CBC
HCT: 40.3 % (ref 36.0–46.0)
Hemoglobin: 13.8 g/dL (ref 12.0–15.0)
MCH: 31.2 pg (ref 26.0–34.0)
MCHC: 34.2 g/dL (ref 30.0–36.0)
MCV: 91 fL (ref 80.0–100.0)
Platelets: 209 10*3/uL (ref 150–400)
RBC: 4.43 MIL/uL (ref 3.87–5.11)
RDW: 13.2 % (ref 11.5–15.5)
WBC: 7.5 10*3/uL (ref 4.0–10.5)
nRBC: 0 % (ref 0.0–0.2)

## 2021-02-03 NOTE — ED Triage Notes (Signed)
C/o vaginal bleeding since this morning at 0200.  AAOx3.  Skin warm and dry. NAD

## 2021-02-03 NOTE — ED Provider Notes (Signed)
[facility] Emergency Department Provider Note   ____________________________________________    I have reviewed the triage vital signs and the nursing notes.   HISTORY  Chief Complaint Vaginal Bleeding     HPI Carla Sparks is a 87 y.o. female who presents with complaints of vaginal bleeding.  Patient reports history of Paget's disease of the vulva and has had mild bleeding from that in the past however reports that her bleeding overnight was heavier than typical.  Review of records demonstrates the patient saw Dr. Erlene Quan for a periurethral mass as well thought to be related to Paget's disease  Past Medical History:  Diagnosis Date   Arthritis    Bleeding from the urethra    Essential hypertension, benign    HTN (hypertension)    Insomnia    Lesion of urethra    Mixed hyperlipidemia    Osteoarthritis    Paget's disease of vulva ([place])    PONV (postoperative nausea and vomiting)     Patient Active Problem List   Diagnosis Date Noted   S/P reverse total shoulder arthroplasty, right 02/27/2020   Closed fracture of right proximal humerus 09/27/2019   Arthritis of finger of right hand 08/29/2019   Memory loss 01/18/2018   Easy bruising 01/18/2018   Knee pain 03/19/2016   Elevated hemoglobin A1c 08/12/2015   Paget's disease of vulva ([place]) 06/26/2015   Dyslipidemia 06/26/2015   Lesion of bladder 06/26/2015   Arthritis of knee, degenerative 06/26/2015   Post menopausal syndrome 06/26/2015   Skin lesion 06/26/2015   Insomnia 04/29/2015   Paget disease, extra mammary 12/18/2014   Primary osteoarthritis of both knees 11/04/2014   Essential hypertension, benign 03/21/2014   Mixed hyperlipidemia 03/21/2014    Past Surgical History:  Procedure Laterality Date   ABDOMINAL HYSTERECTOMY     CHOLECYSTECTOMY     External hemorrhoid surgery     GALLBLADDER SURGERY     REVERSE SHOULDER ARTHROPLASTY Right 09/27/2019   Procedure: REVERSE  SHOULDER ARTHROPLASTY;  Surgeon: Nicholes Stairs, MD;  Location: [facility];  Service: Orthopedics;  Laterality: Right;  2 hrs RNFA   TONSILLECTOMY     TOTAL ABDOMINAL HYSTERECTOMY W/ BILATERAL SALPINGOOPHORECTOMY     VULVECTOMY     several surgeries    Prior to Admission medications   Medication Sig Start Date End Date Taking? Authorizing Provider  amLODipine (NORVASC) 5 MG tablet Take 1 tablet (5 mg total) by mouth daily. 09/04/20   Karamalegos, Devonne Doughty, DO  Cholecalciferol (VITAMIN D3) 125 MCG (5000 UT) CAPS Take 5,000 Units by mouth daily.     [provider]  diclofenac Sodium (VOLTAREN) 1 % GEL APPLY TWO GRAMS TOPICALLY THREE TIMES DAILY AS NEEDED FOR KNEE AND HAND ARTHRITIS PAIN Patient not taking: Reported on 09/04/2020 11/04/19   Karamalegos, Devonne Doughty, DO  Flaxseed, Linseed, (FLAX SEED OIL) 1000 MG CAPS Take 1,000 mg by mouth daily.     [provider]  ibuprofen (ADVIL) 600 MG tablet Take 1 tablet (600 mg total) by mouth every 6 (six) hours as needed for up to 30 doses for mild pain or moderate pain. 09/23/19   Wyvonnia Dusky, MD  lisinopril (ZESTRIL) 40 MG tablet Take 1 tablet (40 mg total) by mouth daily. 09/04/20   Karamalegos, Devonne Doughty, DO  Multiple Vitamin (MULTIVITAMIN WITH MINERALS) TABS tablet Take 1 tablet by mouth daily.    [provider]  simvastatin (ZOCOR) 40 MG tablet Take 1 tablet (40 mg  total) by mouth daily at 6 PM. 09/04/20   Karamalegos, Devonne Doughty, DO  traZODone (DESYREL) 100 MG tablet Take 1 tablet (100 mg total) by mouth at bedtime as needed for sleep. 09/04/20   Karamalegos, Devonne Doughty, DO  Turmeric Curcumin 500 MG CAPS Take 1,500 mg by mouth daily.    [provider]     Allergies Eggs or egg-derived products, Oxycodone-acetaminophen, and Percocet [oxycodone-acetaminophen]  Family History  Problem Relation Age of Onset   Heart disease Father        Diagnosed age 53   Kidney cancer Father    Heart failure  Mother    Cancer Brother    Alzheimer's disease Maternal Aunt    Alzheimer's disease Maternal Uncle    Breast cancer Neg Hx     Social History Social History   Tobacco Use   Smoking status: Never   Smokeless tobacco: Never  Vaping Use   Vaping Use: Never used  Substance Use Topics   Alcohol use: No   Drug use: No    Review of Systems  Constitutional: No fever/chills Eyes: No visual changes.  ENT: No sore throat. Cardiovascular: Denies chest pain. Respiratory: Denies shortness of breath. Gastrointestinal: No abdominal pain.  No nausea, no vomiting.   Genitourinary: As above Musculoskeletal: Negative for back pain. Skin: Negative for rash. Neurological: Negative for headaches or weakness   ____________________________________________   PHYSICAL EXAM:  VITAL SIGNS: ED Triage Vitals  Enc Vitals Group     BP 02/03/21 0910 (!) 180/76     Pulse Rate 02/03/21 0910 85     Resp 02/03/21 0910 18     Temp 02/03/21 0910 98 F (36.7 C)     Temp Source 02/03/21 0910 Oral     SpO2 02/03/21 0910 99 %     Weight 02/03/21 0905 68.2 kg (150 lb 5.7 oz)     Height 02/03/21 0905 1.651 m (5\' 5" )     Head Circumference --      Peak Flow --      Pain Score 02/03/21 0905 0     Pain Loc --      Pain Edu? --      Excl. in [place]? --     Constitutional: Alert and oriented. No acute distress. Pleasant and interactive  Nose: No congestion/rhinnorhea.   Respiratory: Normal respiratory effort.  No retractions.  Gastrointestinal: Soft and nontender. No distention.   Genitourinary: Periurethral mass noted, inflamed and erythematous with stigmata of recent bleeding, no evidence of bleeding from the vaginal canal. Musculoskeletal: No lower extremity tenderness nor edema.  Warm and well perfused Neurologic:  Normal speech and language. No gross focal neurologic deficits are appreciated.  Skin:  Skin is warm, dry and intact. No rash noted. Psychiatric: Mood and affect are normal. Speech and  behavior are normal.  ____________________________________________   LABS (all labs ordered are listed, but only abnormal results are displayed)  Labs Reviewed  COMPREHENSIVE METABOLIC PANEL - Abnormal; Notable for the following components:      Result Value   Glucose, Bld 105 (*)    All other components within normal limits  CBC   ____________________________________________  EKG  None ____________________________________________  RADIOLOGY  Ultrasound negative for atypical masses ____________________________________________   PROCEDURES  Procedure(s) performed: No  Procedures   Critical Care performed: No ____________________________________________   INITIAL IMPRESSION / ASSESSMENT AND PLAN / ED COURSE  Pertinent labs & imaging results that were available during my care of the patient were  reviewed by me and considered in my medical decision making (see chart for details).   Patient well-appearing on exam evidence of recent bleeding from periurethral mass.  Likely related to Paget's disease.  Lab work unremarkable, ultrasound unremarkable, outpatient follow-up with urology    ____________________________________________   FINAL CLINICAL IMPRESSION(S) / ED DIAGNOSES  Final diagnoses:  Paget's disease of vulva ([place])  Abnormal vaginal bleeding        Note:  This document was prepared using Dragon voice recognition software and may include unintentional dictation errors.    Lavonia Drafts, MD 02/03/21 1259

## 2021-02-03 NOTE — ED Notes (Signed)
[place] back from [place].

## 2021-02-24 ENCOUNTER — Ambulatory Visit (INDEPENDENT_AMBULATORY_CARE_PROVIDER_SITE_OTHER): Payer: Medicare PPO

## 2021-02-24 VITALS — Ht 65.0 in | Wt 150.0 lb

## 2021-02-24 DIAGNOSIS — Z0000 Encounter for general adult medical examination without abnormal findings: Secondary | ICD-10-CM

## 2021-02-24 NOTE — Patient Instructions (Signed)
Carla Sparks , Thank you for taking time to come for your Medicare Wellness Visit. I appreciate your ongoing commitment to your health goals. Please review the following plan we discussed and let me know if I can assist you in the future.   Screening recommendations/referrals: Colonoscopy: not required Mammogram: not required Bone Density: completed 01/01/2015 Recommended yearly ophthalmology/optometry visit for glaucoma screening and checkup Recommended yearly dental visit for hygiene and checkup  Vaccinations: Influenza vaccine: decline Pneumococcal vaccine: decline Tdap vaccine: completed 09/23/2019, due 09/22/2029 Shingles vaccine: discussed   Covid-19: 06/25/2020, 12/08/2019, 11/24/2019  Advanced directives: Advance directive discussed with you today.   Conditions/risks identified: none  Next appointment: Follow up in one year for your annual wellness visit    Preventive Care 65 Years and Older, Female Preventive care refers to lifestyle choices and visits with your health care provider that can promote health and wellness. What does preventive care include? A yearly physical exam. This is also called an annual well check. Dental exams once or twice a year. Routine eye exams. Ask your health care provider how often you should have your eyes checked. Personal lifestyle choices, including: Daily care of your teeth and gums. Regular physical activity. Eating a healthy diet. Avoiding tobacco and drug use. Limiting alcohol use. Practicing safe sex. Taking low-dose aspirin every day. Taking vitamin and mineral supplements as recommended by your health care provider. What happens during an annual well check? The services and screenings done by your health care provider during your annual well check will depend on your age, overall health, lifestyle risk factors, and family history of disease. Counseling  Your health care provider may ask you questions about your: Alcohol  use. Tobacco use. Drug use. Emotional well-being. Home and relationship well-being. Sexual activity. Eating habits. History of falls. Memory and ability to understand (cognition). Work and work Statistician. Reproductive health. Screening  You may have the following tests or measurements: Height, weight, and BMI. Blood pressure. Lipid and cholesterol levels. These may be checked every 5 years, or more frequently if you are over 62 years old. Skin check. Lung cancer screening. You may have this screening every year starting at age 68 if you have a 30-pack-year history of smoking and currently smoke or have quit within the past 15 years. Fecal occult blood test (FOBT) of the stool. You may have this test every year starting at age 40. Flexible sigmoidoscopy or colonoscopy. You may have a sigmoidoscopy every 5 years or a colonoscopy every 10 years starting at age 71. Hepatitis C blood test. Hepatitis B blood test. Sexually transmitted disease (STD) testing. Diabetes screening. This is done by checking your blood sugar (glucose) after you have not eaten for a while (fasting). You may have this done every 1-3 years. Bone density scan. This is done to screen for osteoporosis. You may have this done starting at age 63. Mammogram. This may be done every 1-2 years. Talk to your health care provider about how often you should have regular mammograms. Talk with your health care provider about your test results, treatment options, and if necessary, the need for more tests. Vaccines  Your health care provider may recommend certain vaccines, such as: Influenza vaccine. This is recommended every year. Tetanus, diphtheria, and acellular pertussis (Tdap, Td) vaccine. You may need a Td booster every 10 years. Zoster vaccine. You may need this after age 38. Pneumococcal 13-valent conjugate (PCV13) vaccine. One dose is recommended after age 66. Pneumococcal polysaccharide (PPSV23) vaccine. One dose is  recommended after age 63. Talk to your health care provider about which screenings and vaccines you need and how often you need them. This information is not intended to replace advice given to you by your health care provider. Make sure you discuss any questions you have with your health care provider. Document Released: 08/08/2015 Document Revised: 03/31/2016 Document Reviewed: 05/13/2015 Elsevier Interactive Patient Education  2017 [place] Prevention in the Home Falls can cause injuries. They can happen to people of all ages. There are many things you can do to make your home safe and to help prevent falls. What can I do on the outside of my home? Regularly fix the edges of walkways and driveways and fix any cracks. Remove anything that might make you trip as you walk through a door, such as a raised step or threshold. Trim any bushes or trees on the path to your home. Use bright outdoor lighting. Clear any walking paths of anything that might make someone trip, such as rocks or tools. Regularly check to see if handrails are loose or broken. Make sure that both sides of any steps have handrails. Any raised decks and porches should have guardrails on the edges. Have any leaves, snow, or ice cleared regularly. Use sand or salt on walking paths during winter. Clean up any spills in your garage right away. This includes oil or grease spills. What can I do in the bathroom? Use night lights. Install grab bars by the toilet and in the tub and shower. Do not use towel bars as grab bars. Use non-skid mats or decals in the tub or shower. If you need to sit down in the shower, use a plastic, non-slip stool. Keep the floor dry. Clean up any water that spills on the floor as soon as it happens. Remove soap buildup in the tub or shower regularly. Attach bath mats securely with double-sided non-slip rug tape. Do not have throw rugs and other things on the floor that can make you  trip. What can I do in the bedroom? Use night lights. Make sure that you have a light by your bed that is easy to reach. Do not use any sheets or blankets that are too big for your bed. They should not hang down onto the floor. Have a firm chair that has side arms. You can use this for support while you get dressed. Do not have throw rugs and other things on the floor that can make you trip. What can I do in the kitchen? Clean up any spills right away. Avoid walking on wet floors. Keep items that you use a lot in easy-to-reach places. If you need to reach something above you, use a strong step stool that has a grab bar. Keep electrical cords out of the way. Do not use floor polish or wax that makes floors slippery. If you must use wax, use non-skid floor wax. Do not have throw rugs and other things on the floor that can make you trip. What can I do with my stairs? Do not leave any items on the stairs. Make sure that there are handrails on both sides of the stairs and use them. Fix handrails that are broken or loose. Make sure that handrails are as long as the stairways. Check any carpeting to make sure that it is firmly attached to the stairs. Fix any carpet that is loose or worn. Avoid having throw rugs at the top or bottom of the stairs. If you  do have throw rugs, attach them to the floor with carpet tape. Make sure that you have a light switch at the top of the stairs and the bottom of the stairs. If you do not have them, ask someone to add them for you. What else can I do to help prevent falls? Wear shoes that: Do not have high heels. Have rubber bottoms. Are comfortable and fit you well. Are closed at the toe. Do not wear sandals. If you use a stepladder: Make sure that it is fully opened. Do not climb a closed stepladder. Make sure that both sides of the stepladder are locked into place. Ask someone to hold it for you, if possible. Clearly mark and make sure that you can  see: Any grab bars or handrails. First and last steps. Where the edge of each step is. Use tools that help you move around (mobility aids) if they are needed. These include: Canes. Walkers. Scooters. Crutches. Turn on the lights when you go into a dark area. Replace any light bulbs as soon as they burn out. Set up your furniture so you have a clear path. Avoid moving your furniture around. If any of your floors are uneven, fix them. If there are any pets around you, be aware of where they are. Review your medicines with your doctor. Some medicines can make you feel dizzy. This can increase your chance of falling. Ask your doctor what other things that you can do to help prevent falls. This information is not intended to replace advice given to you by your health care provider. Make sure you discuss any questions you have with your health care provider. Document Released: 05/08/2009 Document Revised: 12/18/2015 Document Reviewed: 08/16/2014 Elsevier Interactive Patient Education  2017 [employer].

## 2021-02-24 NOTE — Progress Notes (Signed)
I connected with Carla Sparks today by telephone and verified that I am speaking with the correct person using two identifiers. Location patient: home Location provider: work Persons participating in the virtual visit: Carla Sparks, Glenna Durand LPN.   I discussed the limitations, risks, security and privacy concerns of performing an evaluation and management service by telephone and the availability of in person appointments. I also discussed with the patient that there may be a patient responsible charge related to this service. The patient expressed understanding and verbally consented to this telephonic visit.    Interactive audio and video telecommunications were attempted between this provider and patient, however failed, due to patient having technical difficulties OR patient did not have access to video capability.  We continued and completed visit with audio only.     Vital signs may be patient reported or missing.  Subjective:   Carla Sparks is a 87 y.o. female who presents for Medicare Annual (Subsequent) preventive examination.  Review of Systems     Cardiac Risk Factors include: advanced age (>44mn, >>64women);dyslipidemia;hypertension     Objective:    Today's Vitals   02/24/21 1136  Weight: 150 lb (68 kg)  Height: '5\' 5"'$  (1.651 m)   Body mass index is 24.96 kg/m.  Advanced Directives 02/24/2021 02/03/2021 09/28/2019 09/23/2019 10/12/2017 06/21/2017 10/06/2016  Does Patient Have a Medical Advance Directive? No No No No No - No  Does patient want to make changes to medical advance directive? - - - - - No - Patient declined -  Would patient like information on creating a medical advance directive? - No - Patient declined No - Patient declined No - Patient declined No - Patient declined - No - Patient declined    Current Medications (verified) Outpatient Encounter Medications as of 02/24/2021  Medication Sig   amLODipine (NORVASC) 5 MG tablet Take 1 tablet  (5 mg total) by mouth daily.   Cholecalciferol (VITAMIN D3) 125 MCG (5000 UT) CAPS Take 5,000 Units by mouth daily.    Flaxseed, Linseed, (FLAX SEED OIL) 1000 MG CAPS Take 1,000 mg by mouth daily.    ibuprofen (ADVIL) 600 MG tablet Take 1 tablet (600 mg total) by mouth every 6 (six) hours as needed for up to 30 doses for mild pain or moderate pain.   lisinopril (ZESTRIL) 40 MG tablet Take 1 tablet (40 mg total) by mouth daily.   Multiple Vitamin (MULTIVITAMIN WITH MINERALS) TABS tablet Take 1 tablet by mouth daily.   simvastatin (ZOCOR) 40 MG tablet Take 1 tablet (40 mg total) by mouth daily at 6 PM.   traZODone (DESYREL) 100 MG tablet Take 1 tablet (100 mg total) by mouth at bedtime as needed for sleep.   Turmeric Curcumin 500 MG CAPS Take 1,500 mg by mouth daily.   diclofenac Sodium (VOLTAREN) 1 % GEL APPLY TWO GRAMS TOPICALLY THREE TIMES DAILY AS NEEDED FOR KNEE AND HAND ARTHRITIS PAIN (Patient not taking: No sig reported)   No facility-administered encounter medications on file as of 02/24/2021.    Allergies (verified) Eggs or egg-derived products, Oxycodone-acetaminophen, and Percocet [oxycodone-acetaminophen]   History: Past Medical History:  Diagnosis Date   Arthritis    Bleeding from the urethra    Essential hypertension, benign    HTN (hypertension)    Insomnia    Lesion of urethra    Mixed hyperlipidemia    Osteoarthritis    Paget's disease of vulva (HCC)    PONV (postoperative nausea and vomiting)  Past Surgical History:  Procedure Laterality Date   ABDOMINAL HYSTERECTOMY     CHOLECYSTECTOMY     External hemorrhoid surgery     GALLBLADDER SURGERY     REVERSE SHOULDER ARTHROPLASTY Right 09/27/2019   Procedure: REVERSE SHOULDER ARTHROPLASTY;  Surgeon: Nicholes Stairs, MD;  Location: [place];  Service: Orthopedics;  Laterality: Right;  2 hrs RNFA   TONSILLECTOMY     TOTAL ABDOMINAL HYSTERECTOMY W/ BILATERAL SALPINGOOPHORECTOMY     VULVECTOMY     several surgeries    Family History  Problem Relation Age of Onset   Heart disease Father        Diagnosed age 32   Kidney cancer Father    Heart failure Mother    Cancer Brother    Alzheimer's disease Maternal Aunt    Alzheimer's disease Maternal Uncle    Breast cancer Neg Hx    Social History   Socioeconomic History   Marital status: Married    Spouse name: Not on file   Number of children: Not on file   Years of education: 12   Highest education level: 12th grade  Occupational History   Not on file  Tobacco Use   Smoking status: Never   Smokeless tobacco: Never  Vaping Use   Vaping Use: Never used  Substance and Sexual Activity   Alcohol use: No   Drug use: No   Sexual activity: Not on file    Comment: Hysterectomy  Other Topics Concern   Not on file  Social History Narrative   Not on file   Social Determinants of Health   Financial Resource Strain: Low Risk    Difficulty of Paying Living Expenses: Not hard at all  Food Insecurity: No Food Insecurity   Worried About Charity fundraiser in the Last Year: Never true   [place] in the Last Year: Never true  Transportation Needs: No Transportation Needs   Lack of Transportation (Medical): No   Lack of Transportation (Non-Medical): No  Physical Activity: Insufficiently Active   Days of Exercise per Week: 4 days   Minutes of Exercise per Session: 20 min  Stress: No Stress Concern Present   Feeling of Stress : Not at all  Social Connections: Not on file    Tobacco Counseling Counseling given: Not Answered   Clinical Intake:  Pre-visit preparation completed: Yes  Pain : No/denies pain     Nutritional Status: BMI of 19-24  Normal Nutritional Risks: None Diabetes: No  How often do you need to have someone help you when you read instructions, pamphlets, or other written materials from your doctor or pharmacy?: 1 - Never What is the last grade level you completed in school?: 12th grade  Diabetic?  no  Interpreter Needed?: No  Information entered by :: NAllen LPN   Activities of Daily Living In your present state of health, do you have any difficulty performing the following activities: 02/24/2021  Hearing? N  Vision? N  Difficulty concentrating or making decisions? N  Walking or climbing stairs? N  Dressing or bathing? N  Doing errands, shopping? Y  Comment doesn't Physiological scientist and eating ? N  Using the Toilet? N  In the past six months, have you accidently leaked urine? Y  Do you have problems with loss of bowel control? N  Managing your Medications? N  Managing your Finances? N  Housekeeping or managing your Housekeeping? N  Some recent data might be hidden  Patient Care Team: Olin Hauser, DO as PCP - General (Family Medicine) Poggi, Marshall Cork, MD as Consulting Physician (Surgery) Mellody Drown, MD as Referring Physician (Obstetrics and Gynecology)  Indicate any recent Medical Services you may have received from other than [facility] providers in the past year (date may be approximate).     Assessment:   This is a routine wellness examination for [facility].  Hearing/Vision screen Vision Screening - Comments:: Regular eye exams,   Dietary issues and exercise activities discussed: Current Exercise Habits: Home exercise routine, Type of exercise: stretching;strength training/weights, Time (Minutes): 20, Frequency (Times/Week): 4, Weekly Exercise (Minutes/Week): 80   Goals Addressed             This Visit's Progress    Patient Stated       02/24/2021, no goals       Depression Screen PHQ 2/9 Scores 02/24/2021 02/26/2020 08/29/2019 08/23/2018 01/18/2018 06/29/2017 06/21/2017  PHQ - 2 Score 0 0 0 0 0 0 0    Fall Risk Fall Risk  02/24/2021 02/26/2020 08/29/2019 08/23/2018 01/18/2018  Falls in the past year? 1 0 0 0 No  Comment tripped over step - - - -  Number falls in past yr: 0 0 0 - -  Injury with Fall? 1 0 - - -  Comment broke shoulder - - - -   Risk for fall due to : Medication side effect - No Fall Risks - -  Follow up Falls evaluation completed;Education provided;Falls prevention discussed Falls evaluation completed - Falls evaluation completed -    FALL RISK PREVENTION PERTAINING TO THE HOME:  Any stairs in or around the home? Yes  If so, are there any without handrails? No  Home free of loose throw rugs in walkways, pet beds, electrical cords, etc? Yes  Adequate lighting in your home to reduce risk of falls? Yes   ASSISTIVE DEVICES UTILIZED TO PREVENT FALLS:  Life alert? No  Use of a cane, walker or w/c? No  Grab bars in the bathroom? Yes  Shower chair or bench in shower? No  Elevated toilet seat or a handicapped toilet? Yes   TIMED UP AND GO:  Was the test performed? No .       Cognitive Function:     6CIT Screen 02/24/2021 06/21/2017  What Year? 0 points 0 points  What month? 0 points 0 points  What time? 0 points 0 points  Count back from 20 0 points 0 points  Months in reverse 2 points 0 points  Repeat phrase 0 points 0 points  Total Score 2 0    Immunizations Immunization History  Administered Date(s) Administered   PFIZER(Purple Top)SARS-COV-2 Vaccination 11/24/2019, 12/08/2019, 06/25/2020   Pneumococcal Polysaccharide-23 04/26/2003   Td 09/29/2009   Td (Adult),unspecified 09/29/2009   Tdap 04/25/2012, 09/23/2019    TDAP status: Up to date  Flu Vaccine status: Declined, Education has been provided regarding the importance of this vaccine but patient still declined. Advised may receive this vaccine at local pharmacy or Health Dept. Aware to provide a copy of the vaccination record if obtained from local pharmacy or Health Dept. Verbalized acceptance and understanding.  Pneumococcal vaccine status: Declined,  Education has been provided regarding the importance of this vaccine but patient still declined. Advised may receive this vaccine at local pharmacy or Health Dept. Aware to provide a copy of  the vaccination record if obtained from local pharmacy or Health Dept. Verbalized acceptance and understanding.   Covid-19 vaccine status: Completed vaccines  Qualifies for Shingles Vaccine? Yes   Zostavax completed No   Shingrix Completed?: No.    Education has been provided regarding the importance of this vaccine. Patient has been advised to call insurance company to determine out of pocket expense if they have not yet received this vaccine. Advised may also receive vaccine at local pharmacy or Health Dept. Verbalized acceptance and understanding.  Screening Tests Health Maintenance  Topic Date Due   Zoster Vaccines- Shingrix (1 of 2) Never done   COVID-19 Vaccine (4 - Booster) 09/23/2020   INFLUENZA VACCINE  02/23/2021   TETANUS/TDAP  09/22/2029   DEXA SCAN  Completed   HPV VACCINES  Aged Out    Health Maintenance  Health Maintenance Due  Topic Date Due   Zoster Vaccines- Shingrix (1 of 2) Never done   COVID-19 Vaccine (4 - Booster) 09/23/2020   INFLUENZA VACCINE  02/23/2021    Colorectal cancer screening: No longer required.   Mammogram status: No longer required due to age.  Bone Density status: Completed 01/01/2015.   Lung Cancer Screening: (Low Dose CT Chest recommended if Age 79-80 years, 30 pack-year currently smoking OR have quit w/in 15years.) does not qualify.   Lung Cancer Screening Referral: no  Additional Screening:  Hepatitis C Screening: does not qualify;  Vision Screening: Recommended annual ophthalmology exams for early detection of glaucoma and other disorders of the eye. Is the patient up to date with their annual eye exam?  Yes  Who is the provider or what is the name of the office in which the patient attends annual eye exams? Don't remember name If pt is not established with a provider, would they like to be referred to a provider to establish care? No .   Dental Screening: Recommended annual dental exams for proper oral hygiene  Community  Resource Referral / Chronic Care Management: CRR required this visit?  No   CCM required this visit?  No      Plan:     I have personally reviewed and noted the following in the patient's chart:   Medical and social history Use of alcohol, tobacco or illicit drugs  Current medications and supplements including opioid prescriptions.  Functional ability and status Nutritional status Physical activity Advanced directives List of other physicians Hospitalizations, surgeries, and ER visits in previous 12 months Vitals Screenings to include cognitive, depression, and falls Referrals and appointments  In addition, I have reviewed and discussed with patient certain preventive protocols, quality metrics, and best practice recommendations. A written personalized care plan for preventive services as well as general preventive health recommendations were provided to patient.     Kellie Simmering, LPN   [id number]   Nurse Notes:

## 2021-03-04 ENCOUNTER — Ambulatory Visit: Payer: Medicare PPO | Admitting: Family Medicine

## 2021-09-22 ENCOUNTER — Other Ambulatory Visit: Payer: Self-pay | Admitting: Family Medicine

## 2021-09-22 DIAGNOSIS — I10 Essential (primary) hypertension: Secondary | ICD-10-CM

## 2021-09-22 NOTE — Telephone Encounter (Signed)
Requested medication (s) are due for refill today: yes  Requested medication (s) are on the active medication list: yes  Last refill:  Norvasc 09/04/20 #90 with 3 RF, Zestril 09/04/20 #90 with 3 RF  Future visit scheduled: no  Notes to clinic:  Pt was to return in 6 months, canceled appt and did not reschedule, no upcoming visit scheduled, please assess.      Requested Prescriptions  Pending Prescriptions Disp Refills   amLODipine (NORVASC) 5 MG tablet [Pharmacy Med Name: AMLODIPINE BESYLATE 5 MG Tablet] 90 tablet 3    Sig: Take 1 tablet (5 mg total) by mouth daily.     Cardiovascular: Calcium Channel Blockers 2 Failed - 09/22/2021 10:45 AM      Failed - Last BP in normal range    BP Readings from Last 1 Encounters:  02/03/21 (!) 154/68          Failed - Valid encounter within last 6 months    Recent Outpatient Visits           1 year ago Essential hypertension, benign   [place], Devonne Doughty, DO   1 year ago Essential hypertension, benign   [place], Devonne Doughty, DO   2 years ago Annual physical exam   [place], DO   2 years ago Elevated hemoglobin A1c   [place], Devonne Doughty, DO   3 years ago Annual physical exam   [place], DO       Future Appointments             In 5 months [facility], [place] in normal range    Pulse Readings from Last 1 Encounters:  02/03/21 64           lisinopril (ZESTRIL) 40 MG tablet [Pharmacy Med Name: LISINOPRIL 40 MG Tablet] 90 tablet 3    Sig: TAKE 1 TABLET EVERY DAY     Cardiovascular:  ACE Inhibitors Failed - 09/22/2021 10:45 AM      Failed - Cr in normal range and within 180 days    Creat  Date Value Ref Range Status  08/28/2020 0.83 0.60 - 0.88 mg/dL Final    Comment:    For patients >5  years of age, the reference limit for Creatinine is approximately 13% higher for people identified as African-American. .    Creatinine, Ser  Date Value Ref Range Status  02/03/2021 0.70 0.44 - 1.00 mg/dL Final          Failed - K in normal range and within 180 days    Potassium  Date Value Ref Range Status  02/03/2021 4.0 3.5 - 5.1 mmol/L Final  03/19/2014 3.8 3.5 - 5.1 mmol/L Final          Failed - Last BP in normal range    BP Readings from Last 1 Encounters:  02/03/21 (!) 154/68          Failed - Valid encounter within last 6 months    Recent Outpatient Visits           1 year ago Essential hypertension, benign   [place], DO   1 year ago Essential hypertension, benign   [place], Devonne Doughty, DO   2  years ago Annual physical exam   [facility] [place], Devonne Doughty, DO   2 years ago Elevated hemoglobin A1c   [facility] [place], Devonne Doughty, DO   3 years ago Annual physical exam   [facility] [place], DO       Future Appointments             In 5 months [facility], [place] - Patient is not pregnant

## 2022-01-01 ENCOUNTER — Ambulatory Visit (INDEPENDENT_AMBULATORY_CARE_PROVIDER_SITE_OTHER): Payer: Medicare PPO | Admitting: Family Medicine

## 2022-01-01 ENCOUNTER — Encounter: Payer: Self-pay | Admitting: Family Medicine

## 2022-01-01 VITALS — BP 132/70 | HR 80 | Temp 98.6°F | Ht 65.0 in | Wt 147.4 lb

## 2022-01-01 DIAGNOSIS — F5101 Primary insomnia: Secondary | ICD-10-CM

## 2022-01-01 DIAGNOSIS — R739 Hyperglycemia, unspecified: Secondary | ICD-10-CM

## 2022-01-01 DIAGNOSIS — I10 Essential (primary) hypertension: Secondary | ICD-10-CM

## 2022-01-01 DIAGNOSIS — C519 Malignant neoplasm of vulva, unspecified: Secondary | ICD-10-CM | POA: Diagnosis not present

## 2022-01-01 DIAGNOSIS — E782 Mixed hyperlipidemia: Secondary | ICD-10-CM

## 2022-01-01 LAB — LIPID PANEL
Cholesterol: 141 mg/dL (ref 0–200)
HDL: 59.6 mg/dL (ref >39.00)
LDL Cholesterol: 56 mg/dL (ref 0–99)
NonHDL: 81.64
Total CHOL/HDL Ratio: 2
Triglycerides: 128 mg/dL (ref 0.0–149.0)
VLDL: 25.6 mg/dL (ref 0.0–40.0)

## 2022-01-01 LAB — CBC WITH DIFFERENTIAL/PLATELET
Basophils Absolute: 0 10*3/uL (ref 0.0–0.1)
Basophils Relative: 0.4 % (ref 0.0–3.0)
Eosinophils Absolute: 0.1 10*3/uL (ref 0.0–0.7)
Eosinophils Relative: 0.5 % (ref 0.0–5.0)
HCT: 43.8 % (ref 36.0–46.0)
Hemoglobin: 14.8 g/dL (ref 12.0–15.0)
Lymphocytes Relative: 18 % (ref 12.0–46.0)
Lymphs Abs: 1.8 10*3/uL (ref 0.7–4.0)
MCHC: 33.7 g/dL (ref 30.0–36.0)
MCV: 93.3 fl (ref 78.0–100.0)
Monocytes Absolute: 0.7 10*3/uL (ref 0.1–1.0)
Monocytes Relative: 6.7 % (ref 3.0–12.0)
Neutro Abs: 7.4 10*3/uL (ref 1.4–7.7)
Neutrophils Relative %: 74.4 % (ref 43.0–77.0)
Platelets: 241 10*3/uL (ref 150.0–400.0)
RBC: 4.69 Mil/uL (ref 3.87–5.11)
RDW: 14.1 % (ref 11.5–15.5)
WBC: 9.9 10*3/uL (ref 4.0–10.5)

## 2022-01-01 LAB — COMPREHENSIVE METABOLIC PANEL
ALT: 16 U/L (ref 0–35)
AST: 21 U/L (ref 0–37)
Albumin: 4.5 g/dL (ref 3.5–5.2)
Alkaline Phosphatase: 65 U/L (ref 39–117)
BUN: 14 mg/dL (ref 6–23)
CO2: 29 mEq/L (ref 19–32)
Calcium: 10.9 mg/dL — ABNORMAL HIGH (ref 8.4–10.5)
Chloride: 101 mEq/L (ref 96–112)
Creatinine, Ser: 0.8 mg/dL (ref 0.40–1.20)
GFR: 66.83 mL/min (ref >60.00)
Glucose, Bld: 104 mg/dL — ABNORMAL HIGH (ref 70–99)
Potassium: 4.3 mEq/L (ref 3.5–5.1)
Sodium: 140 mEq/L (ref 135–145)
Total Bilirubin: 1.4 mg/dL — ABNORMAL HIGH (ref 0.2–1.2)
Total Protein: 7.5 g/dL (ref 6.0–8.3)

## 2022-01-01 LAB — HEMOGLOBIN A1C: Hgb A1c MFr Bld: 5.5 % (ref 4.6–6.5)

## 2022-01-01 MED ORDER — LISINOPRIL 40 MG PO TABS: 40.0000 mg | ORAL_TABLET | Freq: Every day | ORAL | 3 refills | Status: DC

## 2022-01-01 MED ORDER — TRAZODONE HCL 100 MG PO TABS: 100.0000 mg | ORAL_TABLET | Freq: Every evening | ORAL | 3 refills | Status: DC | PRN

## 2022-01-01 MED ORDER — AMLODIPINE BESYLATE 5 MG PO TABS: 5.0000 mg | ORAL_TABLET | Freq: Every day | ORAL | 3 refills | Status: DC

## 2022-01-01 MED ORDER — SIMVASTATIN 40 MG PO TABS: 40.0000 mg | ORAL_TABLET | Freq: Every day | ORAL | 3 refills | Status: DC

## 2022-01-01 NOTE — Progress Notes (Signed)
Phone: [phone]   Subjective:  Patient presents today to establish care.  Prior patient of Dr. Parks Ranger.  Chief Complaint  Patient presents with   Establish Care    See problem oriented charting  The following were reviewed and entered/updated in epic: Past Medical History:  Diagnosis Date   Arthritis    Bleeding from the urethra    Essential hypertension, benign    HTN (hypertension)    Insomnia    Lesion of urethra    Mixed hyperlipidemia    Osteoarthritis    Paget's disease of vulva (HCC)    PONV (postoperative nausea and vomiting)    Patient Active Problem List   Diagnosis Date Noted   Memory loss 01/18/2018    Priority: Medium    Elevated hemoglobin A1c 08/12/2015    Priority: Medium    Paget's disease of vulva ([place]) 06/26/2015    Priority: Medium    Lesion of bladder 06/26/2015    Priority: Medium    Insomnia 04/29/2015    Priority: Medium    Paget disease, extra mammary 12/18/2014    Priority: Medium    Primary osteoarthritis of both knees 11/04/2014    Priority: Medium    Essential hypertension, benign 03/21/2014    Priority: Medium    Mixed hyperlipidemia 03/21/2014    Priority: Medium    S/P reverse total shoulder arthroplasty, right 02/27/2020    Priority: Low   Closed fracture of right proximal humerus 09/27/2019    Priority: Low   Arthritis of finger of right hand 08/29/2019    Priority: Low   Post menopausal syndrome 06/26/2015    Priority: Low   Past Surgical History:  Procedure Laterality Date   ABDOMINAL HYSTERECTOMY     CHOLECYSTECTOMY     External hemorrhoid surgery     GALLBLADDER SURGERY     REVERSE SHOULDER ARTHROPLASTY Right 09/27/2019   Procedure: REVERSE SHOULDER ARTHROPLASTY;  Surgeon: Nicholes Stairs, MD;  Location: [facility];  Service: Orthopedics;  Laterality: Right;  2 hrs RNFA   TONSILLECTOMY     TOTAL ABDOMINAL HYSTERECTOMY W/ BILATERAL SALPINGOOPHORECTOMY     VULVECTOMY     several surgeries    Family  History  Problem Relation Age of Onset   Heart failure Mother        died at 101   Heart disease Father        Diagnosed age 75   Kidney cancer Father    Aneurysm Father        died at 34   Hypertension Sister    Hypertension Sister    Lupus Sister    Colon cancer Brother        2- ? colon cancer   Hypertension Brother    Other Brother        spinal cord stimulators   Breast cancer Neg Hx     Medications- reviewed and updated Current Outpatient Medications  Medication Sig Dispense Refill   Cholecalciferol (VITAMIN D3) 125 MCG (5000 UT) CAPS Take 5,000 Units by mouth daily.      diclofenac Sodium (VOLTAREN) 1 % GEL APPLY TWO GRAMS TOPICALLY THREE TIMES DAILY AS NEEDED FOR KNEE AND HAND ARTHRITIS PAIN 100 g 3   Flaxseed, Linseed, (FLAX SEED OIL) 1000 MG CAPS Take 1,000 mg by mouth daily.      Multiple Vitamin (MULTIVITAMIN WITH MINERALS) TABS tablet Take 1 tablet by mouth daily.     Turmeric Curcumin 500 MG CAPS Take 1,500 mg by mouth daily.  amLODipine (NORVASC) 5 MG tablet Take 1 tablet (5 mg total) by mouth daily. 90 tablet 3   lisinopril (ZESTRIL) 40 MG tablet Take 1 tablet (40 mg total) by mouth daily. 90 tablet 3   simvastatin (ZOCOR) 40 MG tablet Take 1 tablet (40 mg total) by mouth daily at 6 PM. 90 tablet 3   traZODone (DESYREL) 100 MG tablet Take 1 tablet (100 mg total) by mouth at bedtime as needed for sleep. 90 tablet 3   No current facility-administered medications for this visit.    Allergies-reviewed and updated Allergies  Allergen Reactions   Eggs Or Egg-Derived Products    Oxycodone-Acetaminophen    Percocet [Oxycodone-Acetaminophen] Rash    And darvocet    Social History   Social History Narrative   Lives [place]. Widowed aug 2022. 1 son. 1 granddaughter.       Worked as Radiation protection practitioner: enjoys lunch with friends- looking for new friendships, watching golf    Objective  Objective:  BP 132/70   Pulse 80   Temp 98.6 F  (37 C)   Ht '5\' 5"'$  (1.651 m)   Wt 147 lb 6.4 oz (66.9 kg)   LMP  (LMP Unknown)   SpO2 98%   BMI 24.53 kg/m  Gen: NAD, resting comfortably HEENT: Mucous membranes are moist. Oropharynx normal. TM normal. Eyes: sclera and lids normal, PERRLA Neck: no thyromegaly, no cervical lymphadenopathy CV: RRR no murmurs rubs or gallops Lungs: CTAB no crackles, wheeze, rhonchi Abdomen: soft/nontender/nondistended/normal bowel sounds. No rebound or guarding.  Ext: no edema Skin: warm, dry Neuro: 5/5 strength in upper and lower extremities, antalgic gait Does appear to have some pain getting onto table   Assessment and Plan:   #Paget's disease of vulva S:Noted at ED visit on 02/04/2011 with history of periurethral mass as well as thought this was related to Paget's disease-appears she was discharged for outpatient care  Last oncology note 10/12/2017 with Dr. Theora Gianotti gyn/onc noted follow up for pagets disease of vulva and urethra dating back to 1980s with recurrent episodes  and partial vulvectomy 2009, recurrence 2014 requiring another surgery. Has opted out of surgery for urethral tract but did trial efudex topical (skin too irritated though). . Recommended 1 year follow up in note A/P: she has made a palliative decision to not proceed with further treatment. She declines any itching/irritation and declines further follow up at this time- she recognizes there is a chance of cancer progression/death/debility associated with this but would not want surgery or other treatments at this time    #hypertension S: medication: Amlodipine 5 mg, lisinopril 40 mg -Was taken off of hydrochlorothiazide 12.5 mg in 2022-related to good control A/P: Controlled. Continue current medications.    #hyperlipidemia-primary prevention only S: Medication: Simvastatin 40 mg Lab Results  Component Value Date   CHOL 155 08/28/2020   HDL 51 08/28/2020   LDLCALC 79 08/28/2020   TRIG 155 (H) 08/28/2020   CHOLHDL 3.0  08/28/2020  A/P: reasonable control for primary prevention- continue current meds and update lipids  #Insomnia S: Medication: Trazodone 100 mg nightly in the past. Recently doing melatonin up to 10 mg- still waking up at 2 am - went back to trazodone yesterday and slept A/P: reasonable control when on trazodone- she wants to use sparingly- refilled today    # Hyperglycemia/insulin resistance/prediabetes-up to 5.9 well before 2020-numbers significantly improved since that time and check annually S:  Medication: None A/P: last few a1c  checks have been excellent- updtae with labs    #Arthritis S: Medication: Turmeric, Voltaren gel, tylenol arthritis -History of left shoulder replacement, bilateral knee arthritis -Follow-up with [facility] for her knees A/P: ongoing issues- continue to follow with [facility] and continue current meds    #Lower extremity neuropathy/tingling-noted at least in 2012 by prior PCP-recommended considering alpha lipoic acid- he states not as bad lately- has a device that stimulates her nerves and that helps her   Health Maintenance Due  Topic Date Due   Zoster Vaccines- Shingrix (1 of 2)- consider at pharmacy Never done   Pneumonia Vaccine 48+ Years old (2 - PCV)- consider in 6 months 04/25/2004  - reaction to covid booster- vaginal bleeding after boosters and wants to opt out   Recommended follow up: Return in about 6 months (around 07/03/2022) for physical or sooner if needed.Schedule b4 you leave. Future Appointments  Date Time Provider [place]  03/02/2022 11:40 AM [place] ADVISOR [facility]  NOT fasting today   ICD-10-CM   1. Essential hypertension, benign  I10 amLODipine (NORVASC) 5 MG tablet    lisinopril (ZESTRIL) 40 MG tablet    CBC with Differential/Platelet    Comprehensive metabolic panel    Lipid panel    DISCONTINUED: amLODipine (NORVASC) 5 MG tablet    DISCONTINUED: lisinopril (ZESTRIL) 40 MG tablet    2. Mixed  hyperlipidemia  E78.2 simvastatin (ZOCOR) 40 MG tablet    CBC with Differential/Platelet    Comprehensive metabolic panel    Lipid panel    DISCONTINUED: simvastatin (ZOCOR) 40 MG tablet    3. Primary insomnia  F51.01 traZODone (DESYREL) 100 MG tablet    DISCONTINUED: traZODone (DESYREL) 100 MG tablet    4. Hyperglycemia  R73.9 Hemoglobin A1c      Meds ordered this encounter  Medications   DISCONTD: amLODipine (NORVASC) 5 MG tablet    Sig: Take 1 tablet (5 mg total) by mouth daily.    Dispense:  90 tablet    Refill:  3   DISCONTD: lisinopril (ZESTRIL) 40 MG tablet    Sig: Take 1 tablet (40 mg total) by mouth daily.    Dispense:  90 tablet    Refill:  3   DISCONTD: simvastatin (ZOCOR) 40 MG tablet    Sig: Take 1 tablet (40 mg total) by mouth daily at 6 PM.    Dispense:  90 tablet    Refill:  3   DISCONTD: traZODone (DESYREL) 100 MG tablet    Sig: Take 1 tablet (100 mg total) by mouth at bedtime as needed for sleep.    Dispense:  90 tablet    Refill:  3   amLODipine (NORVASC) 5 MG tablet    Sig: Take 1 tablet (5 mg total) by mouth daily.    Dispense:  90 tablet    Refill:  3   lisinopril (ZESTRIL) 40 MG tablet    Sig: Take 1 tablet (40 mg total) by mouth daily.    Dispense:  90 tablet    Refill:  3   simvastatin (ZOCOR) 40 MG tablet    Sig: Take 1 tablet (40 mg total) by mouth daily at 6 PM.    Dispense:  90 tablet    Refill:  3   traZODone (DESYREL) 100 MG tablet    Sig: Take 1 tablet (100 mg total) by mouth at bedtime as needed for sleep.    Dispense:  90 tablet    Refill:  3  Return precautions advised. Garret Reddish, MD

## 2022-01-01 NOTE — Patient Instructions (Addendum)
Health Maintenance Due  Topic Date Due   Zoster Vaccines- Shingrix (1 of 2)- consider at pharmacy Never done   Pneumonia Vaccine 56+ Years old (2 - PCV)- consider in 6 months 04/25/2004   Please stop by lab before you go If you have mychart- we will send your results within 3 business days of [place] receiving them.  If you do not have mychart- we will call you about results within 5 business days of [place] receiving them.  *please also note that you will see labs on mychart as soon as they post. I will later go in and write notes on them- will say "notes from Dr. Yong Channel"   Recommended follow up: Return in about 6 months (around 07/03/2022) for physical or sooner if needed.Schedule b4 you leave.

## 2022-01-06 ENCOUNTER — Other Ambulatory Visit: Payer: Self-pay

## 2022-01-14 ENCOUNTER — Telehealth: Payer: Self-pay | Admitting: Family Medicine

## 2022-01-14 DIAGNOSIS — F5101 Primary insomnia: Secondary | ICD-10-CM

## 2022-01-14 DIAGNOSIS — E782 Mixed hyperlipidemia: Secondary | ICD-10-CM

## 2022-01-14 DIAGNOSIS — I10 Essential (primary) hypertension: Secondary | ICD-10-CM

## 2022-01-14 MED ORDER — TRAZODONE HCL 100 MG PO TABS: 100.0000 mg | ORAL_TABLET | Freq: Every evening | ORAL | 3 refills | Status: DC | PRN

## 2022-01-14 MED ORDER — AMLODIPINE BESYLATE 5 MG PO TABS: 5.0000 mg | ORAL_TABLET | Freq: Every day | ORAL | 3 refills | Status: DC

## 2022-01-14 MED ORDER — LISINOPRIL 40 MG PO TABS: 40.0000 mg | ORAL_TABLET | Freq: Every day | ORAL | 3 refills | Status: DC

## 2022-01-14 MED ORDER — SIMVASTATIN 40 MG PO TABS: 40.0000 mg | ORAL_TABLET | Freq: Every day | ORAL | 3 refills | Status: DC

## 2022-01-14 NOTE — Telephone Encounter (Signed)
Medications sent to pharmacy below.

## 2022-01-14 NOTE — Telephone Encounter (Signed)
Pt states Rxs was sent to a local pharmacy when she wanted a mail-service.   Pt requests for this script a redirect to alternative local pharmacy, see below.   LAST APPOINTMENT DATE:   01/01/22 - NP  NEXT APPOINTMENT DATE: 07/07/22 - CPE  MEDICATION: amLODipine (NORVASC) 5 MG tablet [[id number]]   lisinopril (ZESTRIL) 40 MG tablet [[id number]]  simvastatin (ZOCOR) 40 MG tablet [[id number]]  traZODone (DESYREL) 100 MG tablet [[id number]]    Is the patient out of medication?  States "has one week left"  PHARMACY: [facility] [address] [place], [place] [address] [phone] main [phone] pharmacy

## 2022-01-19 ENCOUNTER — Other Ambulatory Visit (INDEPENDENT_AMBULATORY_CARE_PROVIDER_SITE_OTHER): Payer: Medicare PPO

## 2022-01-19 LAB — COMPREHENSIVE METABOLIC PANEL
ALT: 14 U/L (ref 0–35)
AST: 17 U/L (ref 0–37)
Albumin: 4.2 g/dL (ref 3.5–5.2)
Alkaline Phosphatase: 56 U/L (ref 39–117)
BUN: 13 mg/dL (ref 6–23)
CO2: 28 mEq/L (ref 19–32)
Calcium: 9.7 mg/dL (ref 8.4–10.5)
Chloride: 104 mEq/L (ref 96–112)
Creatinine, Ser: 0.78 mg/dL (ref 0.40–1.20)
GFR: 68.87 mL/min (ref >60.00)
Glucose, Bld: 125 mg/dL — ABNORMAL HIGH (ref 70–99)
Potassium: 4 mEq/L (ref 3.5–5.1)
Sodium: 141 mEq/L (ref 135–145)
Total Bilirubin: 0.9 mg/dL (ref 0.2–1.2)
Total Protein: 6.8 g/dL (ref 6.0–8.3)

## 2022-01-25 ENCOUNTER — Other Ambulatory Visit: Payer: Self-pay

## 2022-01-25 DIAGNOSIS — I10 Essential (primary) hypertension: Secondary | ICD-10-CM

## 2022-01-25 DIAGNOSIS — F5101 Primary insomnia: Secondary | ICD-10-CM

## 2022-01-25 DIAGNOSIS — E782 Mixed hyperlipidemia: Secondary | ICD-10-CM

## 2022-01-25 MED ORDER — SIMVASTATIN 40 MG PO TABS: 40.0000 mg | ORAL_TABLET | Freq: Every day | ORAL | 3 refills | Status: DC

## 2022-01-25 MED ORDER — LISINOPRIL 40 MG PO TABS: 40.0000 mg | ORAL_TABLET | Freq: Every day | ORAL | 3 refills | Status: DC

## 2022-01-25 MED ORDER — AMLODIPINE BESYLATE 5 MG PO TABS: 5.0000 mg | ORAL_TABLET | Freq: Every day | ORAL | 3 refills | Status: DC

## 2022-01-25 MED ORDER — TRAZODONE HCL 100 MG PO TABS: 100.0000 mg | ORAL_TABLET | Freq: Every evening | ORAL | 3 refills | Status: DC | PRN

## 2022-02-26 ENCOUNTER — Ambulatory Visit: Payer: Medicare PPO

## 2022-03-02 ENCOUNTER — Ambulatory Visit: Payer: Medicare PPO

## 2022-04-02 ENCOUNTER — Ambulatory Visit (INDEPENDENT_AMBULATORY_CARE_PROVIDER_SITE_OTHER): Payer: Medicare PPO

## 2022-04-02 DIAGNOSIS — Z0000 Encounter for general adult medical examination without abnormal findings: Secondary | ICD-10-CM

## 2022-04-02 NOTE — Progress Notes (Signed)
Virtual Visit via Telephone Note  I connected with  Carla Sparks on 04/02/22 at 10:00 AM EDT by telephone and verified that I am speaking with the correct person using two identifiers.  Medicare Annual Wellness visit completed telephonically due to Covid-19 pandemic.   Persons participating in this call: This Health Coach and this patient.   Location: Patient: home Provider: office    I discussed the limitations, risks, security and privacy concerns of performing an evaluation and management service by telephone and the availability of in person appointments. The patient expressed understanding and agreed to proceed.  Unable to perform video visit due to video visit attempted and failed and/or patient does not have video capability.   Some vital signs may be absent or patient reported.   Carla Brace, LPN   Subjective:   Carla Sparks is a 88 y.o. female who presents for Medicare Annual (Subsequent) preventive examination.  Review of Systems      Cardiac Risk Factors include: advanced age (>69mn, >>30women);dyslipidemia;hypertension     Objective:    There were no vitals filed for this visit. There is no height or weight on file to calculate BMI.     04/02/2022   10:10 AM 02/24/2021   11:43 AM 02/03/2021    9:05 AM 09/28/2019   12:00 AM 09/23/2019    2:51 PM 10/12/2017    9:58 AM 06/21/2017    8:30 AM  Advanced Directives  Does Patient Have a Medical Advance Directive? Yes No No No No No   Type of Advance Directive [place]       Does patient want to make changes to medical advance directive?       No - Patient declined  Copy of [place] Chart? No - copy requested        Would patient like information on creating a medical advance directive?   No - Patient declined No - Patient declined No - Patient declined No - Patient declined     Current Medications (verified) Outpatient Encounter Medications as of 04/02/2022   Medication Sig   amLODipine (NORVASC) 5 MG tablet Take 1 tablet (5 mg total) by mouth daily.   Cholecalciferol (VITAMIN D3) 125 MCG (5000 UT) CAPS Take 5,000 Units by mouth daily.    diclofenac Sodium (VOLTAREN) 1 % GEL APPLY TWO GRAMS TOPICALLY THREE TIMES DAILY AS NEEDED FOR KNEE AND HAND ARTHRITIS PAIN   Flaxseed, Linseed, (FLAX SEED OIL) 1000 MG CAPS Take 1,000 mg by mouth daily.    lisinopril (ZESTRIL) 40 MG tablet Take 1 tablet (40 mg total) by mouth daily.   Multiple Vitamin (MULTIVITAMIN WITH MINERALS) TABS tablet Take 1 tablet by mouth daily.   simvastatin (ZOCOR) 40 MG tablet Take 1 tablet (40 mg total) by mouth daily at 6 PM.   traZODone (DESYREL) 100 MG tablet Take 1 tablet (100 mg total) by mouth at bedtime as needed for sleep.   [DISCONTINUED] Turmeric Curcumin 500 MG CAPS Take 1,500 mg by mouth daily. (Patient not taking: Reported on 04/02/2022)   No facility-administered encounter medications on file as of 04/02/2022.    Allergies (verified) Eggs or egg-derived products, Oxycodone-acetaminophen, and Percocet [oxycodone-acetaminophen]   History: Past Medical History:  Diagnosis Date   Arthritis    Bleeding from the urethra    Essential hypertension, benign    HTN (hypertension)    Insomnia    Lesion of urethra    Mixed hyperlipidemia  Osteoarthritis    Paget's disease of vulva ([place])    PONV (postoperative nausea and vomiting)    Past Surgical History:  Procedure Laterality Date   ABDOMINAL HYSTERECTOMY     CHOLECYSTECTOMY     External hemorrhoid surgery     GALLBLADDER SURGERY     REVERSE SHOULDER ARTHROPLASTY Right 09/27/2019   Procedure: REVERSE SHOULDER ARTHROPLASTY;  Surgeon: Nicholes Stairs, MD;  Location: [facility];  Service: Orthopedics;  Laterality: Right;  2 hrs RNFA   TONSILLECTOMY     TOTAL ABDOMINAL HYSTERECTOMY W/ BILATERAL SALPINGOOPHORECTOMY     VULVECTOMY     several surgeries   Family History  Problem Relation Age of Onset   Heart failure  Mother        died at 92   Heart disease Father        Diagnosed age 25   Kidney cancer Father    Aneurysm Father        died at 12   Hypertension Sister    Hypertension Sister    Lupus Sister    Colon cancer Brother        48- ? colon cancer   Hypertension Brother    Other Brother        spinal cord stimulators   Breast cancer Neg Hx    Social History   Socioeconomic History   Marital status: Married    Spouse name: Not on file   Number of children: Not on file   Years of education: 12   Highest education level: 12th grade  Occupational History   Not on file  Tobacco Use   Smoking status: Never   Smokeless tobacco: Never  Vaping Use   Vaping Use: Never used  Substance and Sexual Activity   Alcohol use: No   Drug use: No   Sexual activity: Not Currently    Birth control/protection: Surgical    Comment: Hysterectomy  Other Topics Concern   Not on file  Social History Narrative   Lives [place]. Widowed aug 2022. 1 son. 1 granddaughter.       Worked as Radiation protection practitioner: enjoys lunch with friends- looking for new friendships, watching golf   Social Determinants of Health   Financial Resource Strain: Low Risk  (04/02/2022)   Overall Financial Resource Strain (CARDIA)    Difficulty of Paying Living Expenses: Not hard at all  Food Insecurity: No Food Insecurity (04/02/2022)   Hunger Vital Sign    Worried About Running Out of Food in the Last Year: Never true    [place] in the Last Year: Never true  Transportation Needs: No Transportation Needs (02/24/2021)   PRAPARE - Hydrologist (Medical): No    Lack of Transportation (Non-Medical): No  Physical Activity: Insufficiently Active (04/02/2022)   Exercise Vital Sign    Days of Exercise per Week: 5 days    Minutes of Exercise per Session: 10 min  Stress: No Stress Concern Present (04/02/2022)   [place]    Feeling of Stress : Not at all  Social Connections: Socially Isolated (04/02/2022)   Social Connection and Isolation Panel [NHANES]    Frequency of Communication with Friends and Family: More than three times a week    Frequency of Social Gatherings with Friends and Family: Once a week    Attends Religious Services: Never    Active Member of  Clubs or Organizations: No    Attends Archivist Meetings: Never    Marital Status: Widowed    Tobacco Counseling Counseling given: Not Answered   Clinical Intake:  Pre-visit preparation completed: Yes  Pain : No/denies pain     BMI - recorded: 24.53 Nutritional Status: BMI of 19-24  Normal Nutritional Risks: None Diabetes: No  How often do you need to have someone help you when you read instructions, pamphlets, or other written materials from your doctor or pharmacy?: 1 - Never  Diabetic?no   Interpreter Needed?: No  Information entered by :: Charlott Rakes, LPN   Activities of Daily Living    04/02/2022   10:12 AM  In your present state of health, do you have any difficulty performing the following activities:  Hearing? 0  Vision? 0  Difficulty concentrating or making decisions? 0  Walking or climbing stairs? 0  Dressing or bathing? 0  Doing errands, shopping? 0  Preparing Food and eating ? N  Using the Toilet? N  In the past six months, have you accidently leaked urine? Y  Comment wears a pad  Do you have problems with loss of bowel control? N  Managing your Medications? N  Managing your Finances? N  Housekeeping or managing your Housekeeping? N    Patient Care Team: Marin Olp, MD as PCP - General (Family Medicine) Poggi, Marshall Cork, MD as Consulting Physician (Surgery) Mellody Drown, MD as Referring Physician (Obstetrics and Gynecology)  Indicate any recent Medical Services you may have received from other than [facility] providers in the past year (date may be approximate).      Assessment:   This is a routine wellness examination for [facility].  Hearing/Vision screen Hearing Screening - Comments:: Pt denies any hearing issues  Vision Screening - Comments:: Will follow up with new provider   Dietary issues and exercise activities discussed: Current Exercise Habits: Home exercise routine, Type of exercise: walking, Time (Minutes): 10, Frequency (Times/Week): 5, Weekly Exercise (Minutes/Week): 50   Goals Addressed             This Visit's Progress    Patient Stated       None at this time       Depression Screen    04/02/2022   10:10 AM 04/02/2022   10:09 AM 02/24/2021   11:45 AM 02/26/2020   11:19 AM 08/29/2019   10:40 AM 08/23/2018    9:59 AM 01/18/2018   10:49 AM  PHQ 2/9 Scores  PHQ - 2 Score 0 0 0 0 0 0 0    Fall Risk    04/02/2022   10:12 AM 02/24/2021   11:44 AM 02/26/2020   11:18 AM 08/29/2019   10:40 AM 08/23/2018    9:59 AM  Fall Risk   Falls in the past year? 0 1 0 0 0  Comment  tripped over step     Number falls in past yr: 0 0 0 0   Injury with Fall? 0 1 0    Comment  broke shoulder     Risk for fall due to :  Medication side effect  No Fall Risks   Follow up Falls prevention discussed Falls evaluation completed;Education provided;Falls prevention discussed Falls evaluation completed  Falls evaluation completed    FALL RISK PREVENTION PERTAINING TO THE HOME:  Any stairs in or around the home? No  If so, are there any without handrails? No  Home free of loose throw rugs in walkways, pet  beds, electrical cords, etc? Yes  Adequate lighting in your home to reduce risk of falls? Yes   ASSISTIVE DEVICES UTILIZED TO PREVENT FALLS:  Life alert? No  Use of a cane, walker or w/c? Yes  Grab bars in the bathroom? Yes  Shower chair or bench in shower? No  Elevated toilet seat or a handicapped toilet? No   TIMED UP AND GO:  Was the test performed? No .   Cognitive Function:        04/02/2022   10:15 AM 02/24/2021   11:47 AM 06/21/2017     8:41 AM  6CIT Screen  What Year? 0 points 0 points 0 points  What month? 0 points 0 points 0 points  What time? 0 points 0 points 0 points  Count back from 20 0 points 0 points 0 points  Months in reverse 0 points 2 points 0 points  Repeat phrase 0 points 0 points 0 points  Total Score 0 points 2 points 0 points    Immunizations Immunization History  Administered Date(s) Administered   PFIZER Comirnaty(Gray Top)Covid-19 Tri-Sucrose Vaccine 11/24/2019, 12/08/2019   PFIZER(Purple Top)SARS-COV-2 Vaccination 11/24/2019, 12/08/2019, 06/25/2020   Pneumococcal Polysaccharide-23 04/26/2003   Td 09/29/2009   Td (Adult),unspecified 09/29/2009   Tdap 04/25/2012, 09/23/2019    TDAP status: Up to date  Flu Vaccine status: Declined, Education has been provided regarding the importance of this vaccine but patient still declined. Advised may receive this vaccine at local pharmacy or Health Dept. Aware to provide a copy of the vaccination record if obtained from local pharmacy or Health Dept. Verbalized acceptance and understanding.  Pneumococcal vaccine status: Due, Education has been provided regarding the importance of this vaccine. Advised may receive this vaccine at local pharmacy or Health Dept. Aware to provide a copy of the vaccination record if obtained from local pharmacy or Health Dept. Verbalized acceptance and understanding.  Covid-19 vaccine status: Completed vaccines  Qualifies for Shingles Vaccine? Yes   Zostavax completed No   Shingrix Completed?: No.    Education has been provided regarding the importance of this vaccine. Patient has been advised to call insurance company to determine out of pocket expense if they have not yet received this vaccine. Advised may also receive vaccine at local pharmacy or Health Dept. Verbalized acceptance and understanding.  Screening Tests Health Maintenance  Topic Date Due   Zoster Vaccines- Shingrix (1 of 2) Never done   Pneumonia Vaccine 68+  Years old (2 - PCV) 04/25/2004   COVID-19 Vaccine (6 - Pfizer risk series) 08/20/2020   INFLUENZA VACCINE  Never done   TETANUS/TDAP  09/22/2029   DEXA SCAN  Completed   HPV VACCINES  Aged Out    Health Maintenance  Health Maintenance Due  Topic Date Due   Zoster Vaccines- Shingrix (1 of 2) Never done   Pneumonia Vaccine 62+ Years old (2 - PCV) 04/25/2004   COVID-19 Vaccine (6 - Pfizer risk series) 08/20/2020   INFLUENZA VACCINE  Never done    Colorectal cancer screening: No longer required.   Mammogram status: No longer required due to age .  Bone Density status: Completed 01/01/15. Results reflect: Bone density results: OSTEOPENIA. Repeat every as directed  years.   Additional Screening:   Vision Screening: Recommended annual ophthalmology exams for early detection of glaucoma and other disorders of the eye. Is the patient up to date with their annual eye exam?  No  Who is the provider or what is the name of the office  in which the patient attends annual eye exams? Will follow up [place]  If pt is not established with a provider, would they like to be referred to a provider to establish care? No .   Dental Screening: Recommended annual dental exams for proper oral hygiene  Community Resource Referral / Chronic Care Management: CRR required this visit?  No   CCM required this visit?  No      Plan:     I have personally reviewed and noted the following in the patient's chart:   Medical and social history Use of alcohol, tobacco or illicit drugs  Current medications and supplements including opioid prescriptions. Patient is not currently taking opioid prescriptions. Functional ability and status Nutritional status Physical activity Advanced directives List of other physicians Hospitalizations, surgeries, and ER visits in previous 12 months Vitals Screenings to include cognitive, depression, and falls Referrals and appointments  In addition, I have  reviewed and discussed with patient certain preventive protocols, quality metrics, and best practice recommendations. A written personalized care plan for preventive services as well as general preventive health recommendations were provided to patient.     Carla Brace, LPN   [id number]/09/5007   Nurse Notes: none

## 2022-04-02 NOTE — Patient Instructions (Addendum)
[name] , Thank you for taking time to come for your Medicare Wellness Visit. I appreciate your ongoing commitment to your health goals. Please review the following plan we discussed and let me know if I can assist you in the future.   Screening recommendations/referrals: Colonoscopy: no longer required  Mammogram: no longer required  Bone Density: done 01/01/15  Recommended yearly ophthalmology/optometry visit for glaucoma screening and checkup Recommended yearly dental visit for hygiene and checkup  Vaccinations: Influenza vaccine: declined  Pneumococcal vaccine: due  Tdap vaccine: done 09/23/19 repeat every 10 years  Shingles vaccine: Shingrix discussed. Please contact your pharmacy for coverage information.    Covid-19:completed 5/1, 5/15, 06/25/20   Advanced directives: Please bring a copy of your health care power of attorney and living will to the office at your convenience.  Conditions/risks identified: none at this time   Next appointment: Follow up in one year for your annual wellness visit     Preventive Care 65 Years and Older, Female Preventive care refers to lifestyle choices and visits with your health care provider that can promote health and wellness. What does preventive care include? A yearly physical exam. This is also called an annual well check. Dental exams once or twice a year. Routine eye exams. Ask your health care provider how often you should have your eyes checked. Personal lifestyle choices, including: Daily care of your teeth and gums. Regular physical activity. Eating a healthy diet. Avoiding tobacco and drug use. Limiting alcohol use. Practicing safe sex. Taking low-dose aspirin every day. Taking vitamin and mineral supplements as recommended by your health care provider. What happens during an annual well check? The services and screenings done by your health care provider during your annual well check will depend on your age, overall health,  lifestyle risk factors, and family history of disease. Counseling  Your health care provider may ask you questions about your: Alcohol use. Tobacco use. Drug use. Emotional well-being. Home and relationship well-being. Sexual activity. Eating habits. History of falls. Memory and ability to understand (cognition). Work and work Statistician. Reproductive health. Screening  You may have the following tests or measurements: Height, weight, and BMI. Blood pressure. Lipid and cholesterol levels. These may be checked every 5 years, or more frequently if you are over 29 years old. Skin check. Lung cancer screening. You may have this screening every year starting at age 41 if you have a 30-pack-year history of smoking and currently smoke or have quit within the past 15 years. Fecal occult blood test (FOBT) of the stool. You may have this test every year starting at age 24. Flexible sigmoidoscopy or colonoscopy. You may have a sigmoidoscopy every 5 years or a colonoscopy every 10 years starting at age 18. Hepatitis C blood test. Hepatitis B blood test. Sexually transmitted disease (STD) testing. Diabetes screening. This is done by checking your blood sugar (glucose) after you have not eaten for a while (fasting). You may have this done every 1-3 years. Bone density scan. This is done to screen for osteoporosis. You may have this done starting at age 22. Mammogram. This may be done every 1-2 years. Talk to your health care provider about how often you should have regular mammograms. Talk with your health care provider about your test results, treatment options, and if necessary, the need for more tests. Vaccines  Your health care provider may recommend certain vaccines, such as: Influenza vaccine. This is recommended every year. Tetanus, diphtheria, and acellular pertussis (Tdap, Td) vaccine.  You may need a Td booster every 10 years. Zoster vaccine. You may need this after age  9. Pneumococcal 13-valent conjugate (PCV13) vaccine. One dose is recommended after age 39. Pneumococcal polysaccharide (PPSV23) vaccine. One dose is recommended after age 40. Talk to your health care provider about which screenings and vaccines you need and how often you need them. This information is not intended to replace advice given to you by your health care provider. Make sure you discuss any questions you have with your health care provider. Document Released: 08/08/2015 Document Revised: 03/31/2016 Document Reviewed: 05/13/2015 Elsevier Interactive Patient Education  2017 [place] Prevention in the Home Falls can cause injuries. They can happen to people of all ages. There are many things you can do to make your home safe and to help prevent falls. What can I do on the outside of my home? Regularly fix the edges of walkways and driveways and fix any cracks. Remove anything that might make you trip as you walk through a door, such as a raised step or threshold. Trim any bushes or trees on the path to your home. Use bright outdoor lighting. Clear any walking paths of anything that might make someone trip, such as rocks or tools. Regularly check to see if handrails are loose or broken. Make sure that both sides of any steps have handrails. Any raised decks and porches should have guardrails on the edges. Have any leaves, snow, or ice cleared regularly. Use sand or salt on walking paths during winter. Clean up any spills in your garage right away. This includes oil or grease spills. What can I do in the bathroom? Use night lights. Install grab bars by the toilet and in the tub and shower. Do not use towel bars as grab bars. Use non-skid mats or decals in the tub or shower. If you need to sit down in the shower, use a plastic, non-slip stool. Keep the floor dry. Clean up any water that spills on the floor as soon as it happens. Remove soap buildup in the tub or shower  regularly. Attach bath mats securely with double-sided non-slip rug tape. Do not have throw rugs and other things on the floor that can make you trip. What can I do in the bedroom? Use night lights. Make sure that you have a light by your bed that is easy to reach. Do not use any sheets or blankets that are too big for your bed. They should not hang down onto the floor. Have a firm chair that has side arms. You can use this for support while you get dressed. Do not have throw rugs and other things on the floor that can make you trip. What can I do in the kitchen? Clean up any spills right away. Avoid walking on wet floors. Keep items that you use a lot in easy-to-reach places. If you need to reach something above you, use a strong step stool that has a grab bar. Keep electrical cords out of the way. Do not use floor polish or wax that makes floors slippery. If you must use wax, use non-skid floor wax. Do not have throw rugs and other things on the floor that can make you trip. What can I do with my stairs? Do not leave any items on the stairs. Make sure that there are handrails on both sides of the stairs and use them. Fix handrails that are broken or loose. Make sure that handrails are as long as  the stairways. Check any carpeting to make sure that it is firmly attached to the stairs. Fix any carpet that is loose or worn. Avoid having throw rugs at the top or bottom of the stairs. If you do have throw rugs, attach them to the floor with carpet tape. Make sure that you have a light switch at the top of the stairs and the bottom of the stairs. If you do not have them, ask someone to add them for you. What else can I do to help prevent falls? Wear shoes that: Do not have high heels. Have rubber bottoms. Are comfortable and fit you well. Are closed at the toe. Do not wear sandals. If you use a stepladder: Make sure that it is fully opened. Do not climb a closed stepladder. Make sure that  both sides of the stepladder are locked into place. Ask someone to hold it for you, if possible. Clearly mark and make sure that you can see: Any grab bars or handrails. First and last steps. Where the edge of each step is. Use tools that help you move around (mobility aids) if they are needed. These include: Canes. Walkers. Scooters. Crutches. Turn on the lights when you go into a dark area. Replace any light bulbs as soon as they burn out. Set up your furniture so you have a clear path. Avoid moving your furniture around. If any of your floors are uneven, fix them. If there are any pets around you, be aware of where they are. Review your medicines with your doctor. Some medicines can make you feel dizzy. This can increase your chance of falling. Ask your doctor what other things that you can do to help prevent falls. This information is not intended to replace advice given to you by your health care provider. Make sure you discuss any questions you have with your health care provider. Document Released: 05/08/2009 Document Revised: 12/18/2015 Document Reviewed: 08/16/2014 Elsevier Interactive Patient Education  2017 [employer].

## 2022-07-07 ENCOUNTER — Encounter: Payer: Medicare PPO | Admitting: Family Medicine

## 2022-07-08 ENCOUNTER — Encounter: Payer: Self-pay | Admitting: *Deleted

## 2022-09-30 ENCOUNTER — Ambulatory Visit (INDEPENDENT_AMBULATORY_CARE_PROVIDER_SITE_OTHER): Payer: Medicare PPO | Admitting: Family Medicine

## 2022-09-30 ENCOUNTER — Encounter: Payer: Self-pay | Admitting: Family Medicine

## 2022-09-30 ENCOUNTER — Other Ambulatory Visit: Payer: Self-pay | Admitting: Family Medicine

## 2022-09-30 VITALS — BP 150/70 | HR 71 | Temp 98.0°F | Ht 65.0 in | Wt 153.0 lb

## 2022-09-30 DIAGNOSIS — Z0000 Encounter for general adult medical examination without abnormal findings: Secondary | ICD-10-CM | POA: Diagnosis not present

## 2022-09-30 DIAGNOSIS — Z131 Encounter for screening for diabetes mellitus: Secondary | ICD-10-CM

## 2022-09-30 DIAGNOSIS — E782 Mixed hyperlipidemia: Secondary | ICD-10-CM | POA: Diagnosis not present

## 2022-09-30 DIAGNOSIS — C519 Malignant neoplasm of vulva, unspecified: Secondary | ICD-10-CM

## 2022-09-30 DIAGNOSIS — R7309 Other abnormal glucose: Secondary | ICD-10-CM

## 2022-09-30 LAB — COMPREHENSIVE METABOLIC PANEL
ALT: 18 U/L (ref 0–35)
AST: 20 U/L (ref 0–37)
Albumin: 4.4 g/dL (ref 3.5–5.2)
Alkaline Phosphatase: 66 U/L (ref 39–117)
BUN: 14 mg/dL (ref 6–23)
CO2: 31 mEq/L (ref 19–32)
Calcium: 11.1 mg/dL — ABNORMAL HIGH (ref 8.4–10.5)
Chloride: 104 mEq/L (ref 96–112)
Creatinine, Ser: 0.89 mg/dL (ref 0.40–1.20)
GFR: 58.5 mL/min — ABNORMAL LOW (ref >60.00)
Glucose, Bld: 103 mg/dL — ABNORMAL HIGH (ref 70–99)
Potassium: 4.7 mEq/L (ref 3.5–5.1)
Sodium: 144 mEq/L (ref 135–145)
Total Bilirubin: 1.2 mg/dL (ref 0.2–1.2)
Total Protein: 7.3 g/dL (ref 6.0–8.3)

## 2022-09-30 LAB — CBC WITH DIFFERENTIAL/PLATELET
Basophils Absolute: 0.1 10*3/uL (ref 0.0–0.1)
Basophils Relative: 0.7 % (ref 0.0–3.0)
Eosinophils Absolute: 0 10*3/uL (ref 0.0–0.7)
Eosinophils Relative: 0.3 % (ref 0.0–5.0)
HCT: 43.8 % (ref 36.0–46.0)
Hemoglobin: 14.7 g/dL (ref 12.0–15.0)
Lymphocytes Relative: 24.4 % (ref 12.0–46.0)
Lymphs Abs: 1.7 10*3/uL (ref 0.7–4.0)
MCHC: 33.5 g/dL (ref 30.0–36.0)
MCV: 92.1 fl (ref 78.0–100.0)
Monocytes Absolute: 0.6 10*3/uL (ref 0.1–1.0)
Monocytes Relative: 7.8 % (ref 3.0–12.0)
Neutro Abs: 4.8 10*3/uL (ref 1.4–7.7)
Neutrophils Relative %: 66.8 % (ref 43.0–77.0)
Platelets: 245 10*3/uL (ref 150.0–400.0)
RBC: 4.76 Mil/uL (ref 3.87–5.11)
RDW: 13.7 % (ref 11.5–15.5)
WBC: 7.2 10*3/uL (ref 4.0–10.5)

## 2022-09-30 LAB — LIPID PANEL
Cholesterol: 159 mg/dL (ref 0–200)
HDL: 58.8 mg/dL (ref >39.00)
LDL Cholesterol: 75 mg/dL (ref 0–99)
NonHDL: 99.96
Total CHOL/HDL Ratio: 3
Triglycerides: 127 mg/dL (ref 0.0–149.0)
VLDL: 25.4 mg/dL (ref 0.0–40.0)

## 2022-09-30 LAB — HEMOGLOBIN A1C: Hgb A1c MFr Bld: 5.5 % (ref 4.6–6.5)

## 2022-09-30 NOTE — Progress Notes (Signed)
Phone [phone]   Subjective:  Patient presents today for their annual physical. Chief complaint-noted.   See problem oriented charting- ROS- full  review of systems was completed and negative except for: numbness in feet, trouble with slepe- on meds  The following were reviewed and entered/updated in epic: Past Medical History:  Diagnosis Date   Arthritis    Bleeding from the urethra    Essential hypertension, benign    HTN (hypertension)    Insomnia    Lesion of urethra    Mixed hyperlipidemia    Osteoarthritis    Paget's disease of vulva (HCC)    PONV (postoperative nausea and vomiting)    Patient Active Problem List   Diagnosis Date Noted   Memory loss 01/18/2018    Priority: Medium    Elevated hemoglobin A1c 08/12/2015    Priority: Medium    Paget's disease of vulva ([place]) 06/26/2015    Priority: Medium    Lesion of bladder 06/26/2015    Priority: Medium    Insomnia 04/29/2015    Priority: Medium    Paget disease, extra mammary 12/18/2014    Priority: Medium    Primary osteoarthritis of both knees 11/04/2014    Priority: Medium    Essential hypertension, benign 03/21/2014    Priority: Medium    Mixed hyperlipidemia 03/21/2014    Priority: Medium    S/P reverse total shoulder arthroplasty, right 02/27/2020    Priority: Low   Closed fracture of right proximal humerus 09/27/2019    Priority: Low   Arthritis of finger of right hand 08/29/2019    Priority: Low   Post menopausal syndrome 06/26/2015    Priority: Low   Past Surgical History:  Procedure Laterality Date   ABDOMINAL HYSTERECTOMY     CHOLECYSTECTOMY     External hemorrhoid surgery     GALLBLADDER SURGERY     REVERSE SHOULDER ARTHROPLASTY Right 09/27/2019   Procedure: REVERSE SHOULDER ARTHROPLASTY;  Surgeon: Nicholes Stairs, MD;  Location: [facility];  Service: Orthopedics;  Laterality: Right;  2 hrs RNFA   TONSILLECTOMY     TOTAL ABDOMINAL HYSTERECTOMY W/ BILATERAL SALPINGOOPHORECTOMY      VULVECTOMY     several surgeries    Family History  Problem Relation Age of Onset   Heart failure Mother        died at 23   Heart disease Father        Diagnosed age 62   Kidney cancer Father    Aneurysm Father        died at 59   Hypertension Sister    Hypertension Sister    Lupus Sister    Colon cancer Brother        11- ? colon cancer   Hypertension Brother    Other Brother        spinal cord stimulators   Breast cancer Neg Hx     Medications- reviewed and updated Current Outpatient Medications  Medication Sig Dispense Refill   amLODipine (NORVASC) 5 MG tablet Take 1 tablet (5 mg total) by mouth daily. 90 tablet 3   Cholecalciferol (VITAMIN D3) 125 MCG (5000 UT) CAPS Take 5,000 Units by mouth daily.      diclofenac Sodium (VOLTAREN) 1 % GEL APPLY TWO GRAMS TOPICALLY THREE TIMES DAILY AS NEEDED FOR KNEE AND HAND ARTHRITIS PAIN 100 g 3   Flaxseed, Linseed, (FLAX SEED OIL) 1000 MG CAPS Take 1,000 mg by mouth daily.      lisinopril (ZESTRIL) 40 MG tablet  Take 1 tablet (40 mg total) by mouth daily. 90 tablet 3   Multiple Vitamin (MULTIVITAMIN WITH MINERALS) TABS tablet Take 1 tablet by mouth daily.     simvastatin (ZOCOR) 40 MG tablet Take 1 tablet (40 mg total) by mouth daily at 6 PM. 90 tablet 3   traZODone (DESYREL) 100 MG tablet Take 1 tablet (100 mg total) by mouth at bedtime as needed for sleep. 90 tablet 3   No current facility-administered medications for this visit.    Allergies-reviewed and updated Allergies  Allergen Reactions   Egg-Derived Products    Oxycodone-Acetaminophen    Percocet [Oxycodone-Acetaminophen] Rash    And darvocet    Social History   Social History Narrative   Lives [place]. Widowed aug 2022. 1 son. 1 granddaughter.       Worked as Radiation protection practitioner: enjoys lunch with friends- looking for new friendships, watching golf   Objective  Objective:  BP (!) 150/70   Pulse 71   Temp 98 F (36.7 C)   Ht '5\' 5"'$   (1.651 m)   Wt 153 lb (69.4 kg)   LMP  (LMP Unknown)   SpO2 96%   BMI 25.46 kg/m  Gen: NAD, resting comfortably HEENT: Mucous membranes are moist. Oropharynx normal Neck: no thyromegaly CV: RRR no murmurs rubs or gallops Lungs: CTAB no crackles, wheeze, rhonchi Abdomen: soft/nontender/nondistended/normal bowel sounds. No rebound or guarding.  Ext: no edema Skin: warm, dry Neuro: grossly normal, moves all extremities, PERRLA   Assessment and Plan   89 y.o. female presenting for annual physical.  Health Maintenance counseling: 1. Anticipatory guidance: Patient counseled regarding regular dental exams -q6 months, eye exams - advised updating,  avoiding smoking and second hand smoke, limiting alcohol to 1 beverage per day- doesn't drink , no illicit drugs .   2. Risk factor reduction:  Advised patient of need for regular exercise and diet rich and fruits and vegetables to reduce risk of heart attack and stroke.  Exercise- lifitng some weights 5 days a week, walks as well- will walk more with weather improving.  Diet/weight management-wants to cut down on candy.  Wt Readings from Last 3 Encounters:  09/30/22 153 lb (69.4 kg)  01/01/22 147 lb 6.4 oz (66.9 kg)  02/24/21 150 lb (68 kg)  3. Immunizations/screenings/ancillary studies- declines prevnar 20, covid shot on booster (had bleeidng with this) Immunization History  Administered Date(s) Administered   PFIZER Comirnaty(Gray Top)Covid-19 Tri-Sucrose Vaccine 11/24/2019, 12/08/2019   PFIZER(Purple Top)SARS-COV-2 Vaccination 11/24/2019, 12/08/2019, 06/25/2020   Pneumococcal Polysaccharide-23 04/26/2003   Td 09/29/2009   Td (Adult),unspecified 09/29/2009   Tdap 04/25/2012, 09/23/2019  4. Cervical cancer screening- past age based screening recommendations. Does have pagets disease of vulva- wants to hold off on any subsequent surgery. 5. Breast cancer screening- past age based screening recommendations - she does not want to pursue 6.  Colon cancer screening - past age based screening recommendations- no bright red blood per rectum or melena 7. Skin cancer screening- never seen dermatology- declines. advised regular sunscreen use. Denies worrisome, changing, or new skin lesions.  8. Birth control/STD check- postmenopausal. Not dating.  9. Osteoporosis screening at 50- osteopenia noted 2016- she declines repeat- discussed avoiding falls 10. Smoking associated screening - never smoker  Status of chronic or acute concerns    #Paget's disease of vulva S:Noted at ED visit on 02/04/2011 with history of periurethral mass as well as thought this was related to Paget's disease-appears she was  discharged for outpatient care. 4 prior surgeries  Last oncology note 10/12/2017 with Dr. Theora Gianotti noted follow up for pagets disease of vulva and urethra dating back to 1980s with recurrent episodes  and partial vulvectomy 2009, recurrence 2014 requiring another surgery A/P: she prefers not to follow up for palliative reasons     #hypertension S: medication: Amlodipine 5 mg, lisinopril 40 mg -Was taken off of hydrochlorothiazide 12.5 mg in 2022-related to good control Home readings #s: not checking BP Readings from Last 3 Encounters:  09/30/22 (!) 150/70  01/01/22 132/70  02/03/21 (!) 154/68  A/P: blood pressure slightly high in office but reports in past when checked at home was in good range. For patient-  Your blood pressure trend concerns me. I would like for you to buy/use a home cuff to check at least 4x a week. Your goal is <135/85.  Update me in 2-3 weeks by mychart. For now continue current medications   #hyperlipidemia-primary prevention only S: Medication: Simvastatin 40 mg Lab Results  Component Value Date   CHOL 141 01/01/2022   HDL 59.60 01/01/2022   LDLCALC 56 01/01/2022   TRIG 128.0 01/01/2022   CHOLHDL 2 01/01/2022  A/P: lipids well controlled- update lipids- continue current medications -she asks about memory affects -  we offered to take off or try something like rosuvastatin- she declines to make change  #Insomnia S: Medication: Trazodone 100 mg nightly  A/P: reasonable control- continue current medications   -does listen to radio 12 to 5 am which could contribute   # Hyperglycemia/insulin resistance/prediabetes-up to 5.9 well before 2020-numbers significantly improved since that time and check annually S:  Medication: None Lab Results  Component Value Date   HGBA1C 5.5 01/01/2022   HGBA1C 5.3 08/28/2020   HGBA1C 5.5 08/22/2019  A/P: last a1c looked good- update with labs    #Arthritis S: Medication: Turmeric, Voltaren gel, tylenol arthritis -History of left shoulder replacement, bilateral knee arthritis- knees most bothersome  -taking an otc collagen supplement lately that is helpful A/P: reasonably stable- continue current medications     #Lower extremity neuropathy/tingling-noted at least in 2012 by prior PCP-recommended considering alpha lipoic acid- she wants to hold off  - gets numbness- rubbing feet helps. Can have a week no issues. Also no pain.  Lab Results  Component Value Date   [id number] 08/16/2018   Lab Results  Component Value Date   TSH 3.30 08/28/2020   # Trigger finger - on left 3rd finger- wants to hold off on injection for now but can refer to sports medicine if needed- has also seen hand so could refer to them  Recommended follow up: Return in about 6 months (around 04/02/2023) for followup or sooner if needed.Schedule b4 you leave. Future Appointments  Date Time Provider [place]  04/08/2023  9:30 AM [facility] HEALTH COACH [facility]   Lab/Order associations:NOT fasting   ICD-10-CM   1. Preventative health care  Z00.00     2. Elevated hemoglobin A1c  R73.09 HgB A1c    3. Mixed hyperlipidemia  E78.2 CBC with Differential/Platelet    Comprehensive metabolic panel    Lipid panel    4. Screening for diabetes mellitus  Z13.1       No orders of the  defined types were placed in this encounter.   Return precautions advised.  Garret Reddish, MD

## 2022-09-30 NOTE — Patient Instructions (Addendum)
Please stop by lab before you go If you have mychart- we will send your results within 3 business days of [place] receiving them.  If you do not have mychart- we will call you about results within 5 business days of [place] receiving them.  *please also note that you will see labs on mychart as soon as they post. I will later go in and write notes on them- will say "notes from Dr. Yong Channel"   Consider seeing eye doctor for update  blood pressure slightly high in office but reports in past when checked at home was in good range. For patient-  Your blood pressure trend concerns me. I would like for you to buy/use a home cuff to check at least 4x a week. Your goal is <135/85.  Update me in 2-3 weeks by mychart.  -consider omron series 3  Recommended follow up: Return in about 6 months (around 04/02/2023) for followup or sooner if needed.Schedule b4 you leave.

## 2023-01-25 ENCOUNTER — Other Ambulatory Visit: Payer: Self-pay | Admitting: Family Medicine

## 2023-01-25 DIAGNOSIS — E782 Mixed hyperlipidemia: Secondary | ICD-10-CM

## 2023-01-25 DIAGNOSIS — F5101 Primary insomnia: Secondary | ICD-10-CM

## 2023-01-25 DIAGNOSIS — I10 Essential (primary) hypertension: Secondary | ICD-10-CM

## 2023-03-10 ENCOUNTER — Encounter (INDEPENDENT_AMBULATORY_CARE_PROVIDER_SITE_OTHER): Payer: Self-pay

## 2023-04-04 ENCOUNTER — Ambulatory Visit (INDEPENDENT_AMBULATORY_CARE_PROVIDER_SITE_OTHER): Payer: Medicare PPO | Admitting: Family Medicine

## 2023-04-04 ENCOUNTER — Encounter: Payer: Self-pay | Admitting: Family Medicine

## 2023-04-04 VITALS — BP 139/70 | HR 68 | Temp 98.0°F | Ht 65.0 in | Wt 153.4 lb

## 2023-04-04 DIAGNOSIS — Z131 Encounter for screening for diabetes mellitus: Secondary | ICD-10-CM | POA: Diagnosis not present

## 2023-04-04 DIAGNOSIS — R7309 Other abnormal glucose: Secondary | ICD-10-CM

## 2023-04-04 DIAGNOSIS — E782 Mixed hyperlipidemia: Secondary | ICD-10-CM | POA: Diagnosis not present

## 2023-04-04 DIAGNOSIS — I10 Essential (primary) hypertension: Secondary | ICD-10-CM | POA: Diagnosis not present

## 2023-04-04 NOTE — Progress Notes (Signed)
Phone [phone] In person visit   Subjective:   Carla Sparks is a 89 y.o. year old very pleasant female patient who presents for/with See problem oriented charting Chief Complaint  Patient presents with   Medical Management of Chronic Issues   Hypertension   Past Medical History-  Patient Active Problem List   Diagnosis Date Noted   Memory loss 01/18/2018    Priority: Medium    Elevated hemoglobin A1c 08/12/2015    Priority: Medium    Paget's disease of vulva (HCC) 06/26/2015    Priority: Medium    Lesion of bladder 06/26/2015    Priority: Medium    Insomnia 04/29/2015    Priority: Medium    Paget disease, extra mammary 12/18/2014    Priority: Medium    Primary osteoarthritis of both knees 11/04/2014    Priority: Medium    Essential hypertension, benign 03/21/2014    Priority: Medium    Mixed hyperlipidemia 03/21/2014    Priority: Medium    S/P reverse total shoulder arthroplasty, right 02/27/2020    Priority: Low   Closed fracture of right proximal humerus 09/27/2019    Priority: Low   Arthritis of finger of right hand 08/29/2019    Priority: Low   Post menopausal syndrome 06/26/2015    Priority: Low    Medications- reviewed and updated Current Outpatient Medications  Medication Sig Dispense Refill   amLODipine (NORVASC) 5 MG tablet TAKE 1 TABLET (5 MG TOTAL) BY MOUTH DAILY. 90 tablet 3   Cholecalciferol (VITAMIN D3) 125 MCG (5000 UT) CAPS Take 5,000 Units by mouth daily.      diclofenac Sodium (VOLTAREN) 1 % GEL APPLY TWO GRAMS TOPICALLY THREE TIMES DAILY AS NEEDED FOR KNEE AND HAND ARTHRITIS PAIN 100 g 3   Flaxseed, Linseed, (FLAX SEED OIL) 1000 MG CAPS Take 1,000 mg by mouth daily.      lisinopril (ZESTRIL) 40 MG tablet TAKE 1 TABLET (40 MG TOTAL) BY MOUTH DAILY. 90 tablet 3   Multiple Vitamin (MULTIVITAMIN WITH MINERALS) TABS tablet Take 1 tablet by mouth daily.     simvastatin (ZOCOR) 40 MG tablet TAKE 1 TABLET (40 MG TOTAL) BY MOUTH DAILY AT 6 PM.  90 tablet 3   traZODone (DESYREL) 100 MG tablet TAKE 1 TABLET (100 MG TOTAL) BY MOUTH AT BEDTIME AS NEEDED FOR SLEEP. 90 tablet 3   No current facility-administered medications for this visit.     Objective:  BP 139/70 Comment: most recent home reading prior to today  Pulse 68   Temp 98 F (36.7 C)   Ht 5\' 5"  (1.651 m)   Wt 153 lb 6.4 oz (69.6 kg)   LMP  (LMP Unknown)   SpO2 98%   BMI 25.53 kg/m  Gen: NAD, resting comfortably CV: RRR no murmurs rubs or gallops Lungs: CTAB no crackles, wheeze, rhonchi Ext: trace edema Skin: warm, dry     Assessment and Plan   #Hypercalcemia -incidental high calcium last visit  #Paget's disease of vulva- no further follow up for palliative reasons- she believes she is in remission  #hypertension S: medication: Amlodipine 5 mg, lisinopril 40 mg -Was taken off of hydrochlorothiazide 12.5 mg in 2022-related to good control Home readings #s: readings in 130s or into 140s BP Readings from Last 3 Encounters:  04/04/23 139/70  09/30/22 (!) 150/70  01/01/22 132/70  A/P: stable/controlled but high acceptable range- continue current medicines    #hyperlipidemia-primary prevention only S: Medication: Simvastatin 40 mg Lab Results  Component Value  Date   CHOL 159 09/30/2022   HDL 58.80 09/30/2022   LDLCALC 75 09/30/2022   TRIG 127.0 09/30/2022   CHOLHDL 3 09/30/2022  A/P: reasonable control for age- continue current medications   #Insomnia S: Medication: Trazodone 100 mg nightly  A/P: stable- continue current medicines     # Hyperglycemia/insulin resistance/prediabetes-up to 5.9 well before 2020-numbers significantly improved since that time and check annually S:  Medication: None Lab Results  Component Value Date   HGBA1C 5.5 09/30/2022   HGBA1C 5.5 01/01/2022   HGBA1C 5.3 08/28/2020  A/P: hopefully stable- update a1c today. Continue without meds for now     #Arthritis S: Medication: Turmeric, Voltaren gel, tylenol arthritis.  Worse on days with rain -History of left shoulder replacement, bilateral knee arthritis -Follow-up with [facility] for her knees A/P: overall stable- continue current medications     #Lower extremity neuropathy/tingling-noted at least in 2012 by prior PCP-tolerating  Recommended follow up: Return in about 6 months (around 10/02/2023) for physical or sooner if needed.Schedule b4 you leave. Future Appointments  Date Time Provider Department Center  04/07/2023  9:45 AM [facility] ANNUAL WELLNESS VISIT 1 [facility]   Lab/Order associations:   ICD-10-CM   1. Essential hypertension, benign  I10 Comprehensive metabolic panel    2. Mixed hyperlipidemia  E78.2 Comprehensive metabolic panel    3. Elevated hemoglobin A1c  R73.09 Hemoglobin A1c    4. Screening for diabetes mellitus  Z13.1 Hemoglobin A1c    5. Hypercalcemia  E83.52 PTH, intact (no Ca)     No orders of the defined types were placed in this encounter.  Return precautions advised.  Tana Conch, MD

## 2023-04-04 NOTE — Patient Instructions (Addendum)
Please stop by lab before you go If you have mychart- we will send your results within 3 business days of [place] receiving them.  If you do not have mychart- we will call you about results within 5 business days of [place] receiving them.  *please also note that you will see labs on mychart as soon as they post. I will later go in and write notes on them- will say "notes from Dr. Durene Cal"   No changes today unless labs lead [place] to make changes  Recommended follow up: Return in about 6 months (around 10/02/2023) for physical or sooner if needed.Schedule b4 you leave.

## 2023-04-05 LAB — COMPREHENSIVE METABOLIC PANEL
ALT: 15 U/L (ref 0–35)
AST: 20 U/L (ref 0–37)
Albumin: 4.3 g/dL (ref 3.5–5.2)
Alkaline Phosphatase: 62 U/L (ref 39–117)
BUN: 14 mg/dL (ref 6–23)
CO2: 29 meq/L (ref 19–32)
Calcium: 9.9 mg/dL (ref 8.4–10.5)
Chloride: 103 meq/L (ref 96–112)
Creatinine, Ser: 0.81 mg/dL (ref 0.40–1.20)
GFR: 65.26 mL/min (ref >60.00)
Glucose, Bld: 77 mg/dL (ref 70–99)
Potassium: 4.2 meq/L (ref 3.5–5.1)
Sodium: 140 meq/L (ref 135–145)
Total Bilirubin: 1 mg/dL (ref 0.2–1.2)
Total Protein: 7.6 g/dL (ref 6.0–8.3)

## 2023-04-05 LAB — HEMOGLOBIN A1C: Hgb A1c MFr Bld: 5.5 % (ref 4.6–6.5)

## 2023-04-05 LAB — PARATHYROID HORMONE, INTACT (NO CA): PTH: 19 pg/mL (ref 16–77)

## 2023-04-06 ENCOUNTER — Ambulatory Visit: Payer: Medicare PPO | Admitting: Family Medicine

## 2023-04-07 ENCOUNTER — Ambulatory Visit (INDEPENDENT_AMBULATORY_CARE_PROVIDER_SITE_OTHER): Payer: Medicare PPO

## 2023-04-07 VITALS — Wt 153.0 lb

## 2023-04-07 DIAGNOSIS — Z0000 Encounter for general adult medical examination without abnormal findings: Secondary | ICD-10-CM

## 2023-04-07 NOTE — Progress Notes (Signed)
Subjective:   Carla Sparks is a 89 y.o. female who presents for Medicare Annual (Subsequent) preventive examination.  Visit Complete: Virtual  I connected with  Kailin Bakos Lasseigne on 04/07/23 by a audio enabled telemedicine application and verified that I am speaking with the correct person using two identifiers.  Patient Location: Home  Provider Location: Office/Clinic  I discussed the limitations of evaluation and management by telemedicine. The patient expressed understanding and agreed to proceed.   Vital Signs: Unable to obtain new vitals due to this being a telehealth visit.   Review of Systems     Cardiac Risk Factors include: advanced age (>6men, >57 women);dyslipidemia;hypertension     Objective:    Today's Vitals   04/07/23 0946  Weight: 153 lb (69.4 kg)   Body mass index is 25.46 kg/m.     04/07/2023    9:53 AM 04/02/2022   10:10 AM 02/24/2021   11:43 AM 02/03/2021    9:05 AM 09/28/2019   12:00 AM 09/23/2019    2:51 PM 10/12/2017    9:58 AM  Advanced Directives  Does Patient Have a Medical Advance Directive? No Yes No No No No No  Type of Energy manager of Healthcare Power of Attorney in Chart?  No - copy requested       Would patient like information on creating a medical advance directive? No - Patient declined   No - Patient declined No - Patient declined No - Patient declined No - Patient declined    Current Medications (verified) Outpatient Encounter Medications as of 04/07/2023  Medication Sig   amLODipine (NORVASC) 5 MG tablet TAKE 1 TABLET (5 MG TOTAL) BY MOUTH DAILY.   Cholecalciferol (VITAMIN D3) 125 MCG (5000 UT) CAPS Take 5,000 Units by mouth daily.    Flaxseed, Linseed, (FLAX SEED OIL) 1000 MG CAPS Take 1,000 mg by mouth daily.    lisinopril (ZESTRIL) 40 MG tablet TAKE 1 TABLET (40 MG TOTAL) BY MOUTH DAILY.   Multiple Vitamin (MULTIVITAMIN WITH MINERALS) TABS tablet Take 1 tablet by mouth daily.    simvastatin (ZOCOR) 40 MG tablet TAKE 1 TABLET (40 MG TOTAL) BY MOUTH DAILY AT 6 PM.   traZODone (DESYREL) 100 MG tablet TAKE 1 TABLET (100 MG TOTAL) BY MOUTH AT BEDTIME AS NEEDED FOR SLEEP.   Turmeric 450 MG CAPS Take by mouth.   [DISCONTINUED] diclofenac Sodium (VOLTAREN) 1 % GEL APPLY TWO GRAMS TOPICALLY THREE TIMES DAILY AS NEEDED FOR KNEE AND HAND ARTHRITIS PAIN   No facility-administered encounter medications on file as of 04/07/2023.    Allergies (verified) Egg-derived products, Oxycodone-acetaminophen, and Percocet [oxycodone-acetaminophen]   History: Past Medical History:  Diagnosis Date   Arthritis    Bleeding from the urethra    Essential hypertension, benign    HTN (hypertension)    Insomnia    Lesion of urethra    Mixed hyperlipidemia    Osteoarthritis    Paget's disease of vulva (HCC)    PONV (postoperative nausea and vomiting)    Past Surgical History:  Procedure Laterality Date   ABDOMINAL HYSTERECTOMY     CHOLECYSTECTOMY     External hemorrhoid surgery     GALLBLADDER SURGERY     REVERSE SHOULDER ARTHROPLASTY Right 09/27/2019   Procedure: REVERSE SHOULDER ARTHROPLASTY;  Surgeon: Yolonda Kida, MD;  Location: [facility] OR;  Service: Orthopedics;  Laterality: Right;  2 hrs RNFA   TONSILLECTOMY  TOTAL ABDOMINAL HYSTERECTOMY W/ BILATERAL SALPINGOOPHORECTOMY     VULVECTOMY     several surgeries   Family History  Problem Relation Age of Onset   Heart failure Mother        died at 2   Heart disease Father        Diagnosed age 30   Kidney cancer Father    Aneurysm Father        died at 22   Hypertension Sister    Hypertension Sister    Lupus Sister    Colon cancer Brother        78- ? colon cancer   Hypertension Brother    Other Brother        spinal cord stimulators   Breast cancer Neg Hx    Social History   Socioeconomic History   Marital status: Widowed    Spouse name: Not on file   Number of children: Not on file   Years of education:  12   Highest education level: 12th grade  Occupational History   Not on file  Tobacco Use   Smoking status: Never   Smokeless tobacco: Never  Vaping Use   Vaping status: Never Used  Substance and Sexual Activity   Alcohol use: No   Drug use: No   Sexual activity: Not Currently    Birth control/protection: Surgical    Comment: Hysterectomy  Other Topics Concern   Not on file  Social History Narrative   Lives [place]. Widowed aug 2022. 1 son. 1 granddaughter.       Worked as Optician, dispensing: enjoys lunch with friends- looking for new friendships, watching golf   Social Determinants of Health   Financial Resource Strain: Low Risk  (04/07/2023)   Overall Financial Resource Strain (CARDIA)    Difficulty of Paying Living Expenses: Not hard at all  Food Insecurity: No Food Insecurity (04/07/2023)   Hunger Vital Sign    Worried About Running Out of Food in the Last Year: Never true    Ran Out of Food in the Last Year: Never true  Transportation Needs: No Transportation Needs (04/07/2023)   PRAPARE - Administrator, Civil Service (Medical): No    Lack of Transportation (Non-Medical): No  Physical Activity: Insufficiently Active (04/07/2023)   Exercise Vital Sign    Days of Exercise per Week: 5 days    Minutes of Exercise per Session: 10 min  Stress: No Stress Concern Present (04/07/2023)   [employer] of Occupational Health - Occupational Stress Questionnaire    Feeling of Stress : Not at all  Social Connections: Socially Isolated (04/07/2023)   Social Connection and Isolation Panel [NHANES]    Frequency of Communication with Friends and Family: More than three times a week    Frequency of Social Gatherings with Friends and Family: Once a week    Attends Religious Services: Never    Database administrator or Organizations: No    Attends Banker Meetings: Never    Marital Status: Widowed    Tobacco Counseling Counseling given:  Not Answered   Clinical Intake:  Pre-visit preparation completed: Yes  Pain : No/denies pain     BMI - recorded: 25.46 Nutritional Status: BMI 25 -29 Overweight Nutritional Risks: None Diabetes: No  How often do you need to have someone help you when you read instructions, pamphlets, or other written materials from your doctor or pharmacy?: 1 - Never  Interpreter  Needed?: No  Information entered by :: Lanier Ensign, LN   Activities of Daily Living    04/07/2023    9:47 AM  In your present state of health, do you have any difficulty performing the following activities:  Hearing? 0  Vision? 0  Difficulty concentrating or making decisions? 0  Walking or climbing stairs? 0  Dressing or bathing? 0  Doing errands, shopping? 0  Preparing Food and eating ? N  Using the Toilet? N  In the past six months, have you accidently leaked urine? Y  Comment wears a brief/ pad  Do you have problems with loss of bowel control? N  Managing your Medications? N  Managing your Finances? N  Housekeeping or managing your Housekeeping? N    Patient Care Team: Shelva Majestic, MD as PCP - General (Family Medicine) Poggi, Excell Seltzer, MD as Consulting Physician (Surgery) Leida Lauth, MD as Referring Physician (Obstetrics and Gynecology)  Indicate any recent Medical Services you may have received from other than [facility] providers in the past year (date may be approximate).     Assessment:   This is a routine wellness examination for [facility].  Hearing/Vision screen Hearing Screening - Comments:: Pt denies any hearing issues  Vision Screening - Comments:: Pt has no regularl eye exams    Goals Addressed             This Visit's Progress    Patient Stated       Continue to stay healthy and active        Depression Screen    04/07/2023    9:50 AM 04/04/2023    3:27 PM 09/30/2022    9:28 AM 04/02/2022   10:10 AM 04/02/2022   10:09 AM 02/24/2021   11:45 AM 02/26/2020   11:19 AM  PHQ  2/9 Scores  PHQ - 2 Score 0 0 0 0 0 0 0  PHQ- 9 Score 0 0 0        Fall Risk    04/07/2023    9:54 AM 04/04/2023    3:27 PM 04/02/2022   10:12 AM 02/24/2021   11:44 AM 02/26/2020   11:18 AM  Fall Risk   Falls in the past year? 0 0 0 1 0  Comment    tripped over step   Number falls in past yr: 0 0 0 0 0  Injury with Fall? 0 0 0 1 0  Comment    broke shoulder   Risk for fall due to : Impaired vision No Fall Risks  Medication side effect   Follow up Falls prevention discussed Falls evaluation completed Falls prevention discussed Falls evaluation completed;Education provided;Falls prevention discussed Falls evaluation completed    MEDICARE RISK AT HOME: Medicare Risk at Home Any stairs in or around the home?: No If so, are there any without handrails?: No Home free of loose throw rugs in walkways, pet beds, electrical cords, etc?: Yes Adequate lighting in your home to reduce risk of falls?: Yes Life alert?: No Use of a cane, walker or w/c?: Yes (at times for safety) Grab bars in the bathroom?: Yes Shower chair or bench in shower?: No Elevated toilet seat or a handicapped toilet?: No  TIMED UP AND GO:  Was the test performed?  No    Cognitive Function:        04/07/2023    9:56 AM 04/02/2022   10:15 AM 02/24/2021   11:47 AM 06/21/2017    8:41 AM  6CIT Screen  What Year? 0 points 0 points 0 points 0 points  What month? 0 points 0 points 0 points 0 points  What time? 0 points 0 points 0 points 0 points  Count back from 20 0 points 0 points 0 points 0 points  Months in reverse 0 points 0 points 2 points 0 points  Repeat phrase 0 points 0 points 0 points 0 points  Total Score 0 points 0 points 2 points 0 points    Immunizations Immunization History  Administered Date(s) Administered   PFIZER Comirnaty(Gray Top)Covid-19 Tri-Sucrose Vaccine 11/24/2019, 12/08/2019   PFIZER(Purple Top)SARS-COV-2 Vaccination 11/24/2019, 12/08/2019, 06/25/2020   Pneumococcal Polysaccharide-23  04/26/2003   Td 09/29/2009   Td (Adult),unspecified 09/29/2009   Tdap 04/25/2012, 09/23/2019    TDAP status: Up to date  Flu Vaccine status: Declined, Education has been provided regarding the importance of this vaccine but patient still declined. Advised may receive this vaccine at local pharmacy or Health Dept. Aware to provide a copy of the vaccination record if obtained from local pharmacy or Health Dept. Verbalized acceptance and understanding.  Pneumococcal vaccine status: Declined,  Education has been provided regarding the importance of this vaccine but patient still declined. Advised may receive this vaccine at local pharmacy or Health Dept. Aware to provide a copy of the vaccination record if obtained from local pharmacy or Health Dept. Verbalized acceptance and understanding.   Covid-19 vaccine status: Declined, Education has been provided regarding the importance of this vaccine but patient still declined. Advised may receive this vaccine at local pharmacy or Health Dept.or vaccine clinic. Aware to provide a copy of the vaccination record if obtained from local pharmacy or Health Dept. Verbalized acceptance and understanding.  Qualifies for Shingles Vaccine? Yes   Zostavax completed No   Shingrix Completed?: No.    Education has been provided regarding the importance of this vaccine. Patient has been advised to call insurance company to determine out of pocket expense if they have not yet received this vaccine. Advised may also receive vaccine at local pharmacy or Health Dept. Verbalized acceptance and understanding.  Screening Tests Health Maintenance  Topic Date Due   COVID-19 Vaccine (6 - 2023-24 season) 04/20/2023 (Originally 03/27/2023)   Zoster Vaccines- Shingrix (1 of 2) 07/04/2023 (Originally 10/27/1952)   Pneumonia Vaccine 68+ Years old (2 of 2 - PCV) 09/30/2023 (Originally 04/25/2004)   INFLUENZA VACCINE  10/24/2023 (Originally 02/24/2023)   Medicare Annual Wellness (AWV)   04/06/2024   DTaP/Tdap/Td (5 - Td or Tdap) 09/22/2029   DEXA SCAN  Completed   HPV VACCINES  Aged Out    Health Maintenance  There are no preventive care reminders to display for this patient.   Colorectal cancer screening: No longer required.   Mammogram status: No longer required due to age .  Bone Density status: Completed 01/01/15. Results reflect: Bone density results: OSTEOPENIA. Repeat every 2 years.   Additional Screening:   Vision Screening: Recommended annual ophthalmology exams for early detection of glaucoma and other disorders of the eye. Is the patient up to date with their annual eye exam?  No  Who is the provider or what is the name of the office in which the patient attends annual eye exams? No regular eye exams  If pt is not established with a provider, would they like to be referred to a provider to establish care? No .   Dental Screening: Recommended annual dental exams for proper oral hygiene    Community Resource Referral / Chronic Care Management:  CRR required this visit?  No   CCM required this visit?  No     Plan:     I have personally reviewed and noted the following in the patient's chart:   Medical and social history Use of alcohol, tobacco or illicit drugs  Current medications and supplements including opioid prescriptions. Patient is not currently taking opioid prescriptions. Functional ability and status Nutritional status Physical activity Advanced directives List of other physicians Hospitalizations, surgeries, and ER visits in previous 12 months Vitals Screenings to include cognitive, depression, and falls Referrals and appointments  In addition, I have reviewed and discussed with patient certain preventive protocols, quality metrics, and best practice recommendations. A written personalized care plan for preventive services as well as general preventive health recommendations were provided to patient.     Marzella Schlein,  LPN   12/28/5407   After Visit Summary: (MyChart) Due to this being a telephonic visit, the after visit summary with patients personalized plan was offered to patient via MyChart   Nurse Notes: none

## 2023-04-07 NOTE — Patient Instructions (Signed)
Ms. Nudo , Thank you for taking time to come for your Medicare Wellness Visit. I appreciate your ongoing commitment to your health goals. Please review the following plan we discussed and let me know if I can assist you in the future.   Referrals/Orders/Follow-Ups/Clinician Recommendations: stay healthy and active   This is a list of the screening recommended for you and due dates:  Health Maintenance  Topic Date Due   COVID-19 Vaccine (6 - 2023-24 season) 04/20/2023*   Zoster (Shingles) Vaccine (1 of 2) 07/04/2023*   Pneumonia Vaccine (2 of 2 - PCV) 09/30/2023*   Flu Shot  10/24/2023*   Medicare Annual Wellness Visit  04/06/2024   DTaP/Tdap/Td vaccine (5 - Td or Tdap) 09/22/2029   DEXA scan (bone density measurement)  Completed   HPV Vaccine  Aged Out  *Topic was postponed. The date shown is not the original due date.    Advanced directives: (Declined) Advance directive discussed with you today. Even though you declined this today, please call our office should you change your mind, and we can give you the proper paperwork for you to fill out.  Next Medicare Annual Wellness Visit scheduled for next year: Yes

## 2023-10-13 ENCOUNTER — Ambulatory Visit (INDEPENDENT_AMBULATORY_CARE_PROVIDER_SITE_OTHER): Payer: Medicare PPO | Admitting: Family Medicine

## 2023-10-13 ENCOUNTER — Encounter: Payer: Self-pay | Admitting: Family Medicine

## 2023-10-13 VITALS — BP 152/76 | HR 93 | Temp 97.4°F | Ht 65.0 in | Wt 154.8 lb

## 2023-10-13 DIAGNOSIS — R739 Hyperglycemia, unspecified: Secondary | ICD-10-CM

## 2023-10-13 DIAGNOSIS — E782 Mixed hyperlipidemia: Secondary | ICD-10-CM | POA: Diagnosis not present

## 2023-10-13 DIAGNOSIS — Z0000 Encounter for general adult medical examination without abnormal findings: Secondary | ICD-10-CM | POA: Diagnosis not present

## 2023-10-13 DIAGNOSIS — I10 Essential (primary) hypertension: Secondary | ICD-10-CM

## 2023-10-13 DIAGNOSIS — C519 Malignant neoplasm of vulva, unspecified: Secondary | ICD-10-CM | POA: Diagnosis not present

## 2023-10-13 DIAGNOSIS — Z131 Encounter for screening for diabetes mellitus: Secondary | ICD-10-CM | POA: Diagnosis not present

## 2023-10-13 DIAGNOSIS — R7309 Other abnormal glucose: Secondary | ICD-10-CM | POA: Diagnosis not present

## 2023-10-13 LAB — CBC WITH DIFFERENTIAL/PLATELET
Basophils Absolute: 0 10*3/uL (ref 0.0–0.1)
Basophils Relative: 0.6 % (ref 0.0–3.0)
Eosinophils Absolute: 0 10*3/uL (ref 0.0–0.7)
Eosinophils Relative: 0.5 % (ref 0.0–5.0)
HCT: 43 % (ref 36.0–46.0)
Hemoglobin: 14.6 g/dL (ref 12.0–15.0)
Lymphocytes Relative: 24.2 % (ref 12.0–46.0)
Lymphs Abs: 1.6 10*3/uL (ref 0.7–4.0)
MCHC: 33.9 g/dL (ref 30.0–36.0)
MCV: 93.7 fl (ref 78.0–100.0)
Monocytes Absolute: 0.5 10*3/uL (ref 0.1–1.0)
Monocytes Relative: 7.5 % (ref 3.0–12.0)
Neutro Abs: 4.3 10*3/uL (ref 1.4–7.7)
Neutrophils Relative %: 67.2 % (ref 43.0–77.0)
Platelets: 251 10*3/uL (ref 150.0–400.0)
RBC: 4.59 Mil/uL (ref 3.87–5.11)
RDW: 13.6 % (ref 11.5–15.5)
WBC: 6.5 10*3/uL (ref 4.0–10.5)

## 2023-10-13 LAB — LIPID PANEL
Cholesterol: 152 mg/dL (ref 0–200)
HDL: 57.6 mg/dL (ref >39.00)
LDL Cholesterol: 70 mg/dL (ref 0–99)
NonHDL: 94.77
Total CHOL/HDL Ratio: 3
Triglycerides: 123 mg/dL (ref 0.0–149.0)
VLDL: 24.6 mg/dL (ref 0.0–40.0)

## 2023-10-13 LAB — COMPREHENSIVE METABOLIC PANEL
ALT: 15 U/L (ref 0–35)
AST: 21 U/L (ref 0–37)
Albumin: 4.7 g/dL (ref 3.5–5.2)
Alkaline Phosphatase: 61 U/L (ref 39–117)
BUN: 12 mg/dL (ref 6–23)
CO2: 29 meq/L (ref 19–32)
Calcium: 10.2 mg/dL (ref 8.4–10.5)
Chloride: 103 meq/L (ref 96–112)
Creatinine, Ser: 0.85 mg/dL (ref 0.40–1.20)
GFR: 61.37 mL/min (ref >60.00)
Glucose, Bld: 111 mg/dL — ABNORMAL HIGH (ref 70–99)
Potassium: 4.2 meq/L (ref 3.5–5.1)
Sodium: 140 meq/L (ref 135–145)
Total Bilirubin: 1 mg/dL (ref 0.2–1.2)
Total Protein: 7.8 g/dL (ref 6.0–8.3)

## 2023-10-13 LAB — HEMOGLOBIN A1C: Hgb A1c MFr Bld: 5.5 % (ref 4.6–6.5)

## 2023-10-13 NOTE — Patient Instructions (Addendum)
 Consider follow up with eye doctor   blood pressure slightly high today- discussed could add medicine or monitor at home- since its been a stressful morning opted for home monitoring and updating me in 1-2 weeks with home readings- she may trial amlodipine at 10 mg (2 of the 5 mg tablets) through our guidance if elevated over 140/90  Let me know if you change your mind on spot on face- try not to pick at it! Could be a precancer or cancer- definitely if worsens see dermatology  Please stop by lab before you go If you have mychart- we will send your results within 3 business days of [place] receiving them.  If you do not have mychart- we will call you about results within 5 business days of [place] receiving them.  *please also note that you will see labs on mychart as soon as they post. I will later go in and write notes on them- will say "notes from Dr. Durene Cal"   Recommended follow up: Return in about 6 months (around 04/14/2024) for followup or sooner if needed.Schedule b4 you leave.

## 2023-10-13 NOTE — Progress Notes (Signed)
 Phone [phone]   Subjective:  Patient presents today for their annual physical. Chief complaint-noted.   See problem oriented charting- ROS- full  review of systems was completed and negative except for: knee arthritis, insomnia  The following were reviewed and entered/updated in epic: Past Medical History:  Diagnosis Date   Arthritis    Bleeding from the urethra    Essential hypertension, benign    HTN (hypertension)    Insomnia    Lesion of urethra    Mixed hyperlipidemia    Osteoarthritis    Paget's disease of vulva (HCC)    PONV (postoperative nausea and vomiting)    Patient Active Problem List   Diagnosis Date Noted   Memory loss 01/18/2018    Priority: Medium    Elevated hemoglobin A1c 08/12/2015    Priority: Medium    Paget's disease of vulva (HCC) 06/26/2015    Priority: Medium    Lesion of bladder 06/26/2015    Priority: Medium    Insomnia 04/29/2015    Priority: Medium    Paget disease, extra mammary 12/18/2014    Priority: Medium    Primary osteoarthritis of both knees 11/04/2014    Priority: Medium    Essential hypertension, benign 03/21/2014    Priority: Medium    Mixed hyperlipidemia 03/21/2014    Priority: Medium    S/P reverse total shoulder arthroplasty, right 02/27/2020    Priority: Low   Closed fracture of right proximal humerus 09/27/2019    Priority: Low   Arthritis of finger of right hand 08/29/2019    Priority: Low   Post menopausal syndrome 06/26/2015    Priority: Low   Past Surgical History:  Procedure Laterality Date   ABDOMINAL HYSTERECTOMY     CHOLECYSTECTOMY     External hemorrhoid surgery     GALLBLADDER SURGERY     REVERSE SHOULDER ARTHROPLASTY Right 09/27/2019   Procedure: REVERSE SHOULDER ARTHROPLASTY;  Surgeon: Yolonda Kida, MD;  Location: [facility] OR;  Service: Orthopedics;  Laterality: Right;  2 hrs RNFA   TONSILLECTOMY     TOTAL ABDOMINAL HYSTERECTOMY W/ BILATERAL SALPINGOOPHORECTOMY     VULVECTOMY     several  surgeries    Family History  Problem Relation Age of Onset   Heart failure Mother        died at 55   Heart disease Father        Diagnosed age 13   Kidney cancer Father    Aneurysm Father        died at 66   Hypertension Sister    Hypertension Sister    Lupus Sister    Colon cancer Brother        71- ? colon cancer   Hypertension Brother    Other Brother        spinal cord stimulators   Breast cancer Neg Hx     Medications- reviewed and updated Current Outpatient Medications  Medication Sig Dispense Refill   amLODipine (NORVASC) 5 MG tablet TAKE 1 TABLET (5 MG TOTAL) BY MOUTH DAILY. 90 tablet 3   Cholecalciferol (VITAMIN D3) 125 MCG (5000 UT) CAPS Take 5,000 Units by mouth daily.      Flaxseed, Linseed, (FLAX SEED OIL) 1000 MG CAPS Take 1,000 mg by mouth daily.      lisinopril (ZESTRIL) 40 MG tablet TAKE 1 TABLET (40 MG TOTAL) BY MOUTH DAILY. 90 tablet 3   Multiple Vitamin (MULTIVITAMIN WITH MINERALS) TABS tablet Take 1 tablet by mouth daily.  simvastatin (ZOCOR) 40 MG tablet TAKE 1 TABLET (40 MG TOTAL) BY MOUTH DAILY AT 6 PM. 90 tablet 3   traZODone (DESYREL) 100 MG tablet TAKE 1 TABLET (100 MG TOTAL) BY MOUTH AT BEDTIME AS NEEDED FOR SLEEP. 90 tablet 3   Turmeric 450 MG CAPS Take by mouth.     No current facility-administered medications for this visit.    Allergies-reviewed and updated Allergies  Allergen Reactions   Egg-Derived Products    Oxycodone-Acetaminophen    Percocet [Oxycodone-Acetaminophen] Rash    And darvocet    Social History   Social History Narrative   Lives [place]. Widowed aug 2022. 1 son. 1 granddaughter.       Worked as Optician, dispensing: enjoys lunch with friends- looking for new friendships, watching golf   Objective  Objective:  BP (!) 152/76   Pulse 93   Temp (!) 97.4 F (36.3 C)   Ht 5\' 5"  (1.651 m)   Wt 154 lb 12.8 oz (70.2 kg)   LMP  (LMP Unknown)   SpO2 96%   BMI 25.76 kg/m  Gen: NAD, resting  comfortably HEENT: Mucous membranes are moist. Oropharynx normal Neck: no thyromegaly CV: RRR no murmurs rubs or gallops Lungs: CTAB no crackles, wheeze, rhonchi Abdomen: soft/nontender/nondistended/normal bowel sounds. No rebound or guarding.  Ext: no edema Skin: warm, dry Neuro: grossly normal, moves all extremities, PERRLA Antalgic gait with knees   Assessment and Plan   89 y.o. female presenting for annual physical.  Health Maintenance counseling: 1. Anticipatory guidance: Patient counseled regarding regular dental exams -q6 months, eye exams - no recent issues- readers only,  avoiding smoking and second hand smoke , limiting alcohol to 1 beverage per day- doesn't drink , no illicit drugs.   2. Risk factor reduction:  Advised patient of need for regular exercise and diet rich and fruits and vegetables to reduce risk of heart attack and stroke.  Exercise- doing ongoing weight lifting after prior shoulder surgery- plans to walk most days as well- also walks a lot in apartment- long hallway.  Diet/weight management-weight stable from last year-had wanted to cut down candy last year- hasn't cut down   Wt Readings from Last 3 Encounters:  10/13/23 154 lb 12.8 oz (70.2 kg)  04/07/23 153 lb (69.4 kg)  04/04/23 153 lb 6.4 oz (69.6 kg)  3. Immunizations/screenings/ancillary studies-declines Prevnar 20.  Opts out of COVID and flu shot and Shingrix  Immunization History  Administered Date(s) Administered   PFIZER Comirnaty(Gray Top)Covid-19 Tri-Sucrose Vaccine 11/24/2019, 12/08/2019   PFIZER(Purple Top)SARS-COV-2 Vaccination 11/24/2019, 12/08/2019, 06/25/2020   Pneumococcal Polysaccharide-23 04/26/2003   Td 09/29/2009   Td (Adult),unspecified 09/29/2009   Tdap 04/25/2012, 09/23/2019  4. Cervical cancer screening- past age based screening recommendations. Does have pagets disease of vulva (was told dormant)- wants to hold off on any subsequent surgery or pelvic exam . 5. Breast cancer  screening- past age based screening recommendations - she does not want to pursue  6. Colon cancer screening - past age based screening recommendations- no bright red blood per rectum or melena  7. Skin cancer screening-  never seen dermatology- declines. advised regular sunscreen use. Denies worrisome, changing, or new skin lesions.  8. Birth control/STD check-  postmenopausal. Not dating and not planning on it.  9. Osteoporosis screening at 81-  osteopenia noted 2016- she declines repeat- discussed avoiding falls 10. Smoking associated screening - never smoker  Status of chronic or acute concerns   #  Incidental hypercalcemia in the past-improved on repeat and PTH was normal.  Does have glass of milk most evenings- no additoinal calcium supplement  #Paget's disease of vulva S:Noted at ED visit on 02/04/2011 with history of periurethral mass as well as thought this was related to Paget's disease-appears she was discharged for outpatient care. History of 4 surgeries.   Last oncology note 10/12/2017 with Dr. Sonia Side noted follow up for pagets disease of vulva and urethra dating back to 1980s with recurrent episodes  and partial vulvectomy 2009, recurrence 2014 requiring another surgery- . Has opted out of surgery for urethral tract but did trial efudex topical (skin too irritated though). Recommended 1 year follow up  A/P: recommended follow up with gynecology she declines    #hypertension S: medication: Amlodipine 5 mg, lisinopril 40 mg -Was taken off of hydrochlorothiazide 12.5 mg in 2022-related to good control Home readings #s: no recent checks but has cuff BP Readings from Last 3 Encounters:  10/13/23 (!) 152/76  04/04/23 139/70  09/30/22 (!) 150/70   A/P: blood pressure slightly high today- discussed could add medicine or monitor at home- since its been a stressful morning opted for home monitoring and updating me in 1-2 weeks with home readings- she may trial amlodipine at 10 mg (2 of the  5 mg tablets) through our guidance if elevated over 140/90  #hyperlipidemia-primary prevention only S: Medication: Simvastatin 40 mg Lab Results  Component Value Date   CHOL 159 09/30/2022   HDL 58.80 09/30/2022   LDLCALC 75 09/30/2022   TRIG 127.0 09/30/2022   CHOLHDL 3 09/30/2022  A/P: #s very close to ideal last year- update lipids- continue current medications as long as LDL under 100  #Insomnia S: Medication: Trazodone 100 mg nightly- not perfect- gets 4 hours  A/P: imperfect but rasonable control continue current medications     # Hyperglycemia/insulin resistance/prediabetes-up to 5.9 well before 2020-numbers significantly improved since that time and check annually S:  Medication: None Lab Results  Component Value Date   HGBA1C 5.5 04/04/2023   HGBA1C 5.5 09/30/2022   HGBA1C 5.5 01/01/2022  A/P: last a1c has looked good- update today    #Arthritis S: Medication: Turmeric, tylenol arthritis, hylauronic acid formula for knees topical  -History of shoulder replacement, bilateral knee arthritis -Follow-up with [facility] for her knees A/P: ongoing issues but tolerable- continue to monitor     Recommended follow up: Return in about 6 months (around 04/14/2024) for followup or sooner if needed.Schedule b4 you leave. Future Appointments  Date Time Provider Department Center  04/09/2024  9:30 AM [facility] ANNUAL WELLNESS VISIT 1 [facility]   Lab/Order associations: fasting   ICD-10-CM   1. Preventative health care  Z00.00     2. Essential hypertension, benign  I10     3. Mixed hyperlipidemia  E78.2     4. Elevated hemoglobin A1c  R73.09     5. Screening for diabetes mellitus  Z13.1     6. Hyperglycemia  R73.9       No orders of the defined types were placed in this encounter.   Return precautions advised.  Tana Conch, MD

## 2023-10-26 ENCOUNTER — Encounter: Payer: Self-pay | Admitting: Physician Assistant

## 2023-10-26 ENCOUNTER — Telehealth (INDEPENDENT_AMBULATORY_CARE_PROVIDER_SITE_OTHER): Admitting: Physician Assistant

## 2023-10-26 VITALS — Ht 65.0 in | Wt 150.0 lb

## 2023-10-26 DIAGNOSIS — J029 Acute pharyngitis, unspecified: Secondary | ICD-10-CM | POA: Diagnosis not present

## 2023-10-26 MED ORDER — AZITHROMYCIN 250 MG PO TABS: ORAL_TABLET | ORAL | 0 refills | Status: AC

## 2023-10-26 NOTE — Patient Instructions (Signed)
 BP Readings from Last 3 Encounters:  10/13/23 (!) 152/76  04/04/23 139/70  09/30/22 (!) 150/70

## 2023-10-26 NOTE — Progress Notes (Signed)
 I acted as a Neurosurgeon for [employer], PA-C Corky Mull, LPN  Virtual Visit via Video Note   I, Jarold Motto, PA, connected with  Carla Sparks  ([id number], [birth year]) on 10/26/23 at 11:00 AM EDT by a video-enabled telemedicine application and verified that I am speaking with the correct person using two identifiers.  Location: Patient: Home Provider: [facility] office   I discussed the limitations of evaluation and management by telemedicine and the availability of in person appointments. The patient expressed understanding and agreed to proceed.    History of Present Illness: Carla Sparks is a 89 y.o. who identifies as a female who was assigned female at birth, and is being seen today for cough. Pt c/o cough and runny nose started on Mon 3/31, yesterday sore throat, but now today hurts to swallow, diarrhea started this morning. Has been taking Cough & Cold for HBP. Denies fever or chills. Denies recent exposures. She is able to eat and drink. She is pushing fluids. Denies significant pain on one side of the throat.   Problems:  Patient Active Problem List   Diagnosis Date Noted   S/P reverse total shoulder arthroplasty, right 02/27/2020   Closed fracture of right proximal humerus 09/27/2019   Arthritis of finger of right hand 08/29/2019   Memory loss 01/18/2018   Elevated hemoglobin A1c 08/12/2015   Paget's disease of vulva (HCC) 06/26/2015   Lesion of bladder 06/26/2015   Post menopausal syndrome 06/26/2015   Insomnia 04/29/2015   Paget disease, extra mammary 12/18/2014   Primary osteoarthritis of both knees 11/04/2014   Essential hypertension, benign 03/21/2014   Mixed hyperlipidemia 03/21/2014    Allergies:  Allergies  Allergen Reactions   Egg-Derived Products    Oxycodone-Acetaminophen    Percocet [Oxycodone-Acetaminophen] Rash    And darvocet   Medications:  Current Outpatient Medications:    amLODipine (NORVASC) 5 MG tablet,  TAKE 1 TABLET (5 MG TOTAL) BY MOUTH DAILY., Disp: 90 tablet, Rfl: 3   azithromycin (ZITHROMAX) 250 MG tablet, Take 2 tablets on day 1, then 1 tablet daily on days 2 through 5, Disp: 6 tablet, Rfl: 0   Cholecalciferol (VITAMIN D3) 125 MCG (5000 UT) CAPS, Take 5,000 Units by mouth daily. , Disp: , Rfl:    Flaxseed, Linseed, (FLAX SEED OIL) 1000 MG CAPS, Take 1,000 mg by mouth daily. , Disp: , Rfl:    lisinopril (ZESTRIL) 40 MG tablet, TAKE 1 TABLET (40 MG TOTAL) BY MOUTH DAILY., Disp: 90 tablet, Rfl: 3   Multiple Vitamin (MULTIVITAMIN WITH MINERALS) TABS tablet, Take 1 tablet by mouth daily., Disp: , Rfl:    simvastatin (ZOCOR) 40 MG tablet, TAKE 1 TABLET (40 MG TOTAL) BY MOUTH DAILY AT 6 PM., Disp: 90 tablet, Rfl: 3   traZODone (DESYREL) 100 MG tablet, TAKE 1 TABLET (100 MG TOTAL) BY MOUTH AT BEDTIME AS NEEDED FOR SLEEP., Disp: 90 tablet, Rfl: 3   Turmeric 450 MG CAPS, Take by mouth., Disp: , Rfl:   Observations/Objective: Patient is well-developed, well-nourished in no acute distress.  Resting comfortably  at home.  Head is normocephalic, atraumatic.  No labored breathing.  Speech is clear and coherent with logical content.  Patient is alert and oriented at baseline.   Assessment and Plan: 1. Sore throat (Primary) Difficult to assess virtually without Point of Care (POC) testing -- she declined coming in today due to diarrhea No red flag symptom(s) She is requesting treatment(s) for strep -- this is not  unreasonable Reports history of gastrointestinal issues with amox, will use azithromycin today Discussed that if any symptom(s) worsen or do not improve, we will require in office visit to better assess all symptom(s)   Follow Up Instructions: I discussed the assessment and treatment plan with the patient. The patient was provided an opportunity to ask questions and all were answered. The patient agreed with the plan and demonstrated an understanding of the instructions.  A copy of  instructions were sent to the patient via MyChart unless otherwise noted below.   The patient was advised to call back or seek an in-person evaluation if the symptoms worsen or if the condition fails to improve as anticipated.  Jarold Motto, [place]

## 2023-11-14 ENCOUNTER — Other Ambulatory Visit: Payer: Self-pay | Admitting: Family Medicine

## 2023-11-14 DIAGNOSIS — E782 Mixed hyperlipidemia: Secondary | ICD-10-CM

## 2023-11-14 DIAGNOSIS — F5101 Primary insomnia: Secondary | ICD-10-CM

## 2023-11-14 DIAGNOSIS — I10 Essential (primary) hypertension: Secondary | ICD-10-CM

## 2024-04-09 ENCOUNTER — Ambulatory Visit (INDEPENDENT_AMBULATORY_CARE_PROVIDER_SITE_OTHER): Payer: Medicare PPO

## 2024-04-09 VITALS — Wt 150.0 lb

## 2024-04-09 DIAGNOSIS — Z0000 Encounter for general adult medical examination without abnormal findings: Secondary | ICD-10-CM

## 2024-04-09 NOTE — Progress Notes (Signed)
 Subjective:   Carla Sparks is a 89 y.o. who presents for a Medicare Wellness preventive visit.  As a reminder, Annual Wellness Visits don't include a physical exam, and some assessments may be limited, especially if this visit is performed virtually. We may recommend an in-person follow-up visit with your provider if needed.  Visit Complete: Virtual I connected with  Carla Sparks on 04/09/24 by a audio enabled telemedicine application and verified that I am speaking with the correct person using two identifiers.  Patient Location: Home  Provider Location: Office/Clinic  I discussed the limitations of evaluation and management by telemedicine. The patient expressed understanding and agreed to proceed.  Vital Signs: Because this visit was a virtual/telehealth visit, some criteria may be missing or patient reported. Any vitals not documented were not able to be obtained and vitals that have been documented are patient reported.  VideoDeclined- This patient declined Librarian, academic. Therefore the visit was completed with audio only.  Persons Participating in Visit: Patient.  AWV Questionnaire: No: Patient Medicare AWV questionnaire was not completed prior to this visit.  Cardiac Risk Factors include: advanced age (>30men, >81 women);dyslipidemia;hypertension     Objective:    Today's Vitals   04/09/24 1114  Weight: 150 lb (68 kg)   Body mass index is 24.96 kg/m.     04/09/2024   11:18 AM 04/07/2023    9:53 AM 04/02/2022   10:10 AM 02/24/2021   11:43 AM 02/03/2021    9:05 AM 09/28/2019   12:00 AM 09/23/2019    2:51 PM  Advanced Directives  Does Patient Have a Medical Advance Directive? Yes No Yes No No No No  Type of Estate agent of [place];Living will  Healthcare Power of Attorney      Copy of Healthcare Power of Attorney in Chart? No - copy requested  No - copy requested      Would patient like information on  creating a medical advance directive?  No - Patient declined   No - Patient declined No - Patient declined No - Patient declined    Current Medications (verified) Outpatient Encounter Medications as of 04/09/2024  Medication Sig   amLODipine  (NORVASC ) 5 MG tablet TAKE 1 TABLET EVERY DAY   Cholecalciferol (VITAMIN D3) 125 MCG (5000 UT) CAPS Take 5,000 Units by mouth daily.    Flaxseed, Linseed, (FLAX SEED OIL) 1000 MG CAPS Take 1,000 mg by mouth daily.    lisinopril  (ZESTRIL ) 40 MG tablet TAKE 1 TABLET EVERY DAY   Multiple Vitamin (MULTIVITAMIN WITH MINERALS) TABS tablet Take 1 tablet by mouth daily.   simvastatin  (ZOCOR ) 40 MG tablet TAKE 1 TABLET EVERY DAY AT 6PM   traZODone  (DESYREL ) 100 MG tablet TAKE 1 TABLET AT BEDTIME AS NEEDED FOR SLEEP   Turmeric 450 MG CAPS Take by mouth.   No facility-administered encounter medications on file as of 04/09/2024.    Allergies (verified) Egg-derived products, Oxycodone -acetaminophen , and Percocet [oxycodone -acetaminophen ]   History: Past Medical History:  Diagnosis Date   Arthritis    Bleeding from the urethra    Essential hypertension, benign    HTN (hypertension)    Insomnia    Lesion of urethra    Mixed hyperlipidemia    Osteoarthritis    Paget's disease of vulva (HCC)    PONV (postoperative nausea and vomiting)    Past Surgical History:  Procedure Laterality Date   ABDOMINAL HYSTERECTOMY     CHOLECYSTECTOMY     External hemorrhoid  surgery     GALLBLADDER SURGERY     REVERSE SHOULDER ARTHROPLASTY Right 09/27/2019   Procedure: REVERSE SHOULDER ARTHROPLASTY;  Surgeon: Sharl Selinda Dover, MD;  Location: [facility] OR;  Service: Orthopedics;  Laterality: Right;  2 hrs RNFA   TONSILLECTOMY     TOTAL ABDOMINAL HYSTERECTOMY W/ BILATERAL SALPINGOOPHORECTOMY     VULVECTOMY     several surgeries   Family History  Problem Relation Age of Onset   Heart failure Mother        died at 53   Heart disease Father        Diagnosed age 74   Kidney  cancer Father    Aneurysm Father        died at 10   Hypertension Sister    Hypertension Sister    Lupus Sister    Colon cancer Brother        62- ? colon cancer   Hypertension Brother    Other Brother        spinal cord stimulators   Breast cancer Neg Hx    Social History   Socioeconomic History   Marital status: Widowed    Spouse name: Not on file   Number of children: Not on file   Years of education: 12   Highest education level: 12th grade  Occupational History   Not on file  Tobacco Use   Smoking status: Never   Smokeless tobacco: Never  Vaping Use   Vaping status: Never Used  Substance and Sexual Activity   Alcohol use: No   Drug use: No   Sexual activity: Not Currently    Birth control/protection: Surgical    Comment: Hysterectomy  Other Topics Concern   Not on file  Social History Narrative   Lives [place]. Widowed aug 2022. 1 son. 1 granddaughter.       Worked as Optician, dispensing: enjoys lunch with friends- looking for new friendships, watching golf   Social Drivers of Corporate investment banker Strain: Low Risk  (04/09/2024)   Overall Financial Resource Strain (CARDIA)    Difficulty of Paying Living Expenses: Not hard at all  Food Insecurity: No Food Insecurity (04/09/2024)   Hunger Vital Sign    Worried About Running Out of Food in the Last Year: Never true    Ran Out of Food in the Last Year: Never true  Transportation Needs: No Transportation Needs (04/09/2024)   PRAPARE - Administrator, Civil Service (Medical): No    Lack of Transportation (Non-Medical): No  Physical Activity: Insufficiently Active (04/09/2024)   Exercise Vital Sign    Days of Exercise per Week: 5 days    Minutes of Exercise per Session: 10 min  Stress: No Stress Concern Present (04/09/2024)   [employer] of Occupational Health - Occupational Stress Questionnaire    Feeling of Stress: Not at all  Social Connections: Socially Isolated  (04/09/2024)   Social Connection and Isolation Panel    Frequency of Communication with Friends and Family: More than three times a week    Frequency of Social Gatherings with Friends and Family: More than three times a week    Attends Religious Services: Never    Database administrator or Organizations: No    Attends Banker Meetings: Never    Marital Status: Widowed    Tobacco Counseling Counseling given: Not Answered    Clinical Intake:  Pre-visit preparation completed: Yes  Pain :  No/denies pain     Nutritional Risks: None Diabetes: No  Lab Results  Component Value Date   HGBA1C 5.5 10/13/2023   HGBA1C 5.5 04/04/2023   HGBA1C 5.5 09/30/2022     How often do you need to have someone help you when you read instructions, pamphlets, or other written materials from your doctor or pharmacy?: 1 - Never  Interpreter Needed?: No  Information entered by :: Ellouise Haws, LPN   Activities of Daily Living     04/09/2024   11:15 AM  In your present state of health, do you have any difficulty performing the following activities:  Hearing? 0  Vision? 0  Difficulty concentrating or making decisions? 0  Walking or climbing stairs? 0  Dressing or bathing? 0  Doing errands, shopping? 0  Preparing Food and eating ? N  Using the Toilet? N  In the past six months, have you accidently leaked urine? N  Do you have problems with loss of bowel control? N  Managing your Medications? N  Managing your Finances? N  Housekeeping or managing your Housekeeping? N    Patient Care Team: Katrinka Garnette KIDD, MD as PCP - General (Family Medicine) Poggi, Norleen PARAS, MD as Consulting Physician (Surgery) Mancil Barter, MD as Referring Physician (Obstetrics and Gynecology)  I have updated your Care Teams any recent Medical Services you may have received from other providers in the past year.     Assessment:   This is a routine wellness examination for  [facility].  Hearing/Vision screen Hearing Screening - Comments:: Pt denies any hearing issues  Vision Screening - Comments:: Pt encouraged to follow up with provider    Goals Addressed             This Visit's Progress    Patient Stated       Maintain health and activity        Depression Screen     04/09/2024   11:16 AM 04/07/2023    9:50 AM 04/04/2023    3:27 PM 09/30/2022    9:28 AM 04/02/2022   10:10 AM 04/02/2022   10:09 AM 02/24/2021   11:45 AM  PHQ 2/9 Scores  PHQ - 2 Score 0 0 0 0 0 0 0  PHQ- 9 Score  0 0 0       Fall Risk     04/09/2024   11:20 AM 04/07/2023    9:54 AM 04/04/2023    3:27 PM 04/02/2022   10:12 AM 02/24/2021   11:44 AM  Fall Risk   Falls in the past year? 0 0 0 0 1  Comment     tripped over step  Number falls in past yr: 0 0 0 0 0  Injury with Fall? 0 0 0 0 1  Comment     broke shoulder  Risk for fall due to : No Fall Risks Impaired vision No Fall Risks  Medication side effect  Follow up Falls prevention discussed Falls prevention discussed Falls evaluation completed Falls prevention discussed  Falls evaluation completed;Education provided;Falls prevention discussed      Data saved with a previous flowsheet row definition    MEDICARE RISK AT HOME:  Medicare Risk at Home Any stairs in or around the home?: Yes If so, are there any without handrails?: No Home free of loose throw rugs in walkways, pet beds, electrical cords, etc?: Yes Adequate lighting in your home to reduce risk of falls?: Yes Life alert?: No Use of a cane, walker or  w/c?: No Grab bars in the bathroom?: No Shower chair or bench in shower?: No Elevated toilet seat or a handicapped toilet?: No  TIMED UP AND GO:  Was the test performed?  No  Cognitive Function: 6CIT completed        04/09/2024   11:18 AM 04/07/2023    9:56 AM 04/02/2022   10:15 AM 02/24/2021   11:47 AM 06/21/2017    8:41 AM  6CIT Screen  What Year? 0 points 0 points 0 points 0 points 0 points  What month? 0  points 0 points 0 points 0 points 0 points  What time? 0 points 0 points 0 points 0 points 0 points  Count back from 20 0 points 0 points 0 points 0 points 0 points  Months in reverse 0 points 0 points 0 points 2 points 0 points  Repeat phrase 0 points 0 points 0 points 0 points 0 points  Total Score 0 points 0 points 0 points 2 points 0 points    Immunizations Immunization History  Administered Date(s) Administered   PFIZER Comirnaty(Gray Top)Covid-19 Tri-Sucrose Vaccine 11/24/2019, 12/08/2019   PFIZER(Purple Top)SARS-COV-2 Vaccination 11/24/2019, 12/08/2019, 06/25/2020   Pneumococcal Polysaccharide-23 04/26/2003   Td 09/29/2009   Td (Adult),unspecified 09/29/2009   Tdap 04/25/2012, 09/23/2019    Screening Tests Health Maintenance  Topic Date Due   Zoster Vaccines- Shingrix (1 of 2) Never done   Pneumococcal Vaccine: 50+ Years (2 of 2 - PCV) 04/25/2004   Influenza Vaccine  Never done   COVID-19 Vaccine (6 - 2025-26 season) 03/26/2024   Medicare Annual Wellness (AWV)  04/09/2025   DTaP/Tdap/Td (5 - Td or Tdap) 09/22/2029   DEXA SCAN  Completed   HPV VACCINES  Aged Out   Meningococcal B Vaccine  Aged Out    Health Maintenance Items Addressed: See Nurse Notes at the end of this note  Additional Screening:  Vision Screening: Recommended annual ophthalmology exams for early detection of glaucoma and other disorders of the eye. Is the patient up to date with their annual eye exam?  No  Who is the provider or what is the name of the office in which the patient attends annual eye exams? Will need to follow up with new provider   Dental Screening: Recommended annual dental exams for proper oral hygiene  Community Resource Referral / Chronic Care Management: CRR required this visit?  No   CCM required this visit?  No   Plan:    I have personally reviewed and noted the following in the patient's chart:   Medical and social history Use of alcohol, tobacco or illicit drugs   Current medications and supplements including opioid prescriptions. Patient is not currently taking opioid prescriptions. Functional ability and status Nutritional status Physical activity Advanced directives List of other physicians Hospitalizations, surgeries, and ER visits in previous 12 months Vitals Screenings to include cognitive, depression, and falls Referrals and appointments  In addition, I have reviewed and discussed with patient certain preventive protocols, quality metrics, and best practice recommendations. A written personalized care plan for preventive services as well as general preventive health recommendations were provided to patient.   Ellouise VEAR Haws, LPN   [id number]   After Visit Summary: (MyChart) Due to this being a telephonic visit, the after visit summary with patients personalized plan was offered to patient via MyChart   Notes: Nothing significant to report at this time.

## 2024-04-09 NOTE — Patient Instructions (Addendum)
 [name],  Thank you for taking the time for your Medicare Wellness Visit. I appreciate your continued commitment to your health goals. Please review the care plan we discussed, and feel free to reach out if I can assist you further.  Medicare recommends these wellness visits once per year to help you and your care team stay ahead of potential health issues. These visits are designed to focus on prevention, allowing your provider to concentrate on managing your acute and chronic conditions during your regular appointments.  Please note that Annual Wellness Visits do not include a physical exam. Some assessments may be limited, especially if the visit was conducted virtually. If needed, we may recommend a separate in-person follow-up with your provider.  Ongoing Care Seeing your primary care provider every 3 to 6 months helps us  monitor your health and provide consistent, personalized care.   Referrals If a referral was made during today's visit and you haven't received any updates within two weeks, please contact the referred provider directly to check on the status.  Recommended Screenings:  Health Maintenance  Topic Date Due   Zoster (Shingles) Vaccine (1 of 2) Never done   Pneumococcal Vaccine for age over 54 (2 of 2 - PCV) 04/25/2004   Flu Shot  Never done   COVID-19 Vaccine (6 - 2025-26 season) 03/26/2024   Medicare Annual Wellness Visit  04/06/2024   DTaP/Tdap/Td vaccine (5 - Td or Tdap) 09/22/2029   DEXA scan (bone density measurement)  Completed   HPV Vaccine  Aged Out   Meningitis B Vaccine  Aged Out       04/07/2023    9:53 AM  Advanced Directives  Does Patient Have a Medical Advance Directive? No  Would patient like information on creating a medical advance directive? No - Patient declined   Advance Care Planning is important because it: Ensures you receive medical care that aligns with your values, goals, and preferences. Provides guidance to your family and loved  ones, reducing the emotional burden of decision-making during critical moments.  Vision: Annual vision screenings are recommended for early detection of glaucoma, cataracts, and diabetic retinopathy. These exams can also reveal signs of chronic conditions such as diabetes and high blood pressure.  Dental: Annual dental screenings help detect early signs of oral cancer, gum disease, and other conditions linked to overall health, including heart disease and diabetes.  Please see the attached documents for additional preventive care recommendations.   Vaccinations: declines all Influenza vaccine: recommend every Fall Pneumococcal vaccine: recommend once per lifetime Prevnar-20 Tdap vaccine: recommend every 10 years Shingles vaccine: recommend Shingrix which is 2 doses 2-6 months apart and over 90% effective     Covid-19: recommend 2 doses one month apart with a booster 6 months later

## 2024-04-18 ENCOUNTER — Ambulatory Visit (INDEPENDENT_AMBULATORY_CARE_PROVIDER_SITE_OTHER): Admitting: Family Medicine

## 2024-04-18 ENCOUNTER — Encounter: Payer: Self-pay | Admitting: Family Medicine

## 2024-04-18 VITALS — BP 132/68 | HR 79 | Temp 97.8°F | Ht 65.0 in | Wt 149.4 lb

## 2024-04-18 DIAGNOSIS — L989 Disorder of the skin and subcutaneous tissue, unspecified: Secondary | ICD-10-CM

## 2024-04-18 DIAGNOSIS — I10 Essential (primary) hypertension: Secondary | ICD-10-CM | POA: Diagnosis not present

## 2024-04-18 DIAGNOSIS — F5101 Primary insomnia: Secondary | ICD-10-CM | POA: Diagnosis not present

## 2024-04-18 DIAGNOSIS — E782 Mixed hyperlipidemia: Secondary | ICD-10-CM | POA: Diagnosis not present

## 2024-04-18 NOTE — Progress Notes (Signed)
 Phone [phone] In person visit   Subjective:   Carla Sparks is a 89 y.o. year old very pleasant female patient who presents for/with See problem oriented charting Chief Complaint  Patient presents with   Hyperlipidemia   Hypertension   Past Medical History-  Patient Active Problem List   Diagnosis Date Noted   Memory loss 01/18/2018    Priority: Medium    Elevated hemoglobin A1c 08/12/2015    Priority: Medium    Paget's disease of vulva (HCC) 06/26/2015    Priority: Medium    Lesion of bladder 06/26/2015    Priority: Medium    Insomnia 04/29/2015    Priority: Medium    Paget disease, extra mammary 12/18/2014    Priority: Medium    Primary osteoarthritis of both knees 11/04/2014    Priority: Medium    Essential hypertension, benign 03/21/2014    Priority: Medium    Mixed hyperlipidemia 03/21/2014    Priority: Medium    S/P reverse total shoulder arthroplasty, right 02/27/2020    Priority: Low   Closed fracture of right proximal humerus 09/27/2019    Priority: Low   Arthritis of finger of right hand 08/29/2019    Priority: Low   Post menopausal syndrome 06/26/2015    Priority: Low    Medications- reviewed and updated Current Outpatient Medications  Medication Sig Dispense Refill   amLODipine  (NORVASC ) 5 MG tablet TAKE 1 TABLET EVERY DAY 90 tablet 3   Cholecalciferol (VITAMIN D3) 125 MCG (5000 UT) CAPS Take 5,000 Units by mouth daily.      Flaxseed, Linseed, (FLAX SEED OIL) 1000 MG CAPS Take 1,000 mg by mouth daily.      lisinopril  (ZESTRIL ) 40 MG tablet TAKE 1 TABLET EVERY DAY 90 tablet 3   Multiple Vitamin (MULTIVITAMIN WITH MINERALS) TABS tablet Take 1 tablet by mouth daily.     simvastatin  (ZOCOR ) 40 MG tablet TAKE 1 TABLET EVERY DAY AT 6PM 90 tablet 3   traZODone  (DESYREL ) 100 MG tablet TAKE 1 TABLET AT BEDTIME AS NEEDED FOR SLEEP 90 tablet 3   Turmeric 450 MG CAPS Take by mouth.     No current facility-administered medications for this visit.      Objective:  BP 132/68 (BP Location: Left Arm, Patient Position: Sitting, Cuff Size: Normal)   Pulse 79   Temp 97.8 F (36.6 C) (Temporal)   Ht 5' 5 (1.651 m)   Wt 149 lb 6.4 oz (67.8 kg)   LMP  (LMP Unknown)   SpO2 97%   BMI 24.86 kg/m  Gen: NAD, resting comfortably CV: RRR no murmurs rubs or gallops Lungs: CTAB no crackles, wheeze, rhonchi Ext: no edema other than mild in the foot Skin: warm, dry     Assessment and Plan    #Paget's disease of vulva- has opted out of further follow up despite recommendations for follow up . She prefers palliative approach   #hypertension S: medication: Amlodipine  5 mg, lisinopril  40 mg -Was taken off of hydrochlorothiazide  12.5 mg in 2022-related to good control BP Readings from Last 3 Encounters:  04/18/24 132/68  10/13/23 (!) 152/76  04/04/23 139/70  A/P: well controlled continue current medications    #hyperlipidemia-primary prevention only S: Medication: Simvastatin  40 mg  -down 5 lbs from last visit- appetite lower Lab Results  Component Value Date   CHOL 152 10/13/2023   HDL 57.60 10/13/2023   LDLCALC 70 10/13/2023   TRIG 123.0 10/13/2023   CHOLHDL 3 10/13/2023  A/P: excellent control last  visit- continue current medications   #Insomnia S: Medication: Trazodone  100 mg nightly- wakes up around 2 am- still not perfect. Takes a 30 minute nap around 1 pm  A/P: imperfect control- but prefers to maintain regimen  =offered belsomra but wants to hold steady   # Hyperglycemia/insulin resistance/prediabetes-up to 5.9 well before 2020-numbers significantly improved since that time and check annually- looked good in march and hold off until next visit  #Lower extremity neuropathy/tingling-noted at least in 2012 by prior PCP-ongoing mild issues  Recommended follow up: Return in about 6 months (around 10/16/2024) for physical or sooner if needed.Schedule b4 you leave. Future Appointments  Date Time Provider Department Center   04/15/2025  9:20 AM [facility] ANNUAL WELLNESS VISIT 1 [facility] [place]    Lab/Order associations:   ICD-10-CM   1. Essential hypertension, benign  I10     2. Mixed hyperlipidemia  E78.2     3. Primary insomnia  F51.01       No orders of the defined types were placed in this encounter.   Return precautions advised.  Garnette Lukes, MD

## 2024-04-18 NOTE — Patient Instructions (Addendum)
 Please stop by lab before you go If you have mychart- we will send your results within 3 business days of us  receiving them.  If you do not have mychart- we will call you about results within 5 business days of us  receiving them.  *please also note that you will see labs on mychart as soon as they post. I will later go in and write notes on them- will say notes from Dr. Katrinka   Could try to split your trazodone  and take only half tablet at bedtime then if wake up at 2 am take other half to see if can go back to bed.   We have placed a referral for you today to [facility] dermatology- please call their # if you do not hear within a week (may be listed below or you may see mychart message within a few days with #).    Recommended follow up: Return in about 6 months (around 10/16/2024) for physical or sooner if needed.Schedule b4 you leave.

## 2024-08-02 ENCOUNTER — Telehealth: Payer: Self-pay | Admitting: Family Medicine

## 2024-08-02 NOTE — Telephone Encounter (Signed)
 LVM to r/s appt for 10/22/24 CPE.

## 2024-08-17 ENCOUNTER — Other Ambulatory Visit: Payer: Self-pay | Admitting: Family Medicine

## 2024-08-17 DIAGNOSIS — I10 Essential (primary) hypertension: Secondary | ICD-10-CM

## 2024-08-17 MED ORDER — AMLODIPINE BESYLATE 10 MG PO TABS: 10.0000 mg | ORAL_TABLET | Freq: Every day | ORAL | 3 refills | Status: AC

## 2024-10-22 ENCOUNTER — Encounter: Admitting: Family Medicine

## 2024-12-21 ENCOUNTER — Encounter: Admitting: Family Medicine

## 2025-04-15 ENCOUNTER — Ambulatory Visit
